# Patient Record
Sex: Female | Born: 1935 | Race: White | Hispanic: No | Smoking: Former smoker
Health system: Southern US, Community
[De-identification: ages and names within clinical notes are randomized; demographics above are authoritative.]

## PROBLEM LIST (undated history)

## (undated) DIAGNOSIS — I509 Heart failure, unspecified: Secondary | ICD-10-CM

## (undated) DIAGNOSIS — L309 Dermatitis, unspecified: Secondary | ICD-10-CM

## (undated) DIAGNOSIS — J45909 Unspecified asthma, uncomplicated: Secondary | ICD-10-CM

## (undated) DIAGNOSIS — K219 Gastro-esophageal reflux disease without esophagitis: Secondary | ICD-10-CM

## (undated) DIAGNOSIS — H269 Unspecified cataract: Secondary | ICD-10-CM

## (undated) DIAGNOSIS — I10 Essential (primary) hypertension: Secondary | ICD-10-CM

## (undated) DIAGNOSIS — M810 Age-related osteoporosis without current pathological fracture: Secondary | ICD-10-CM

## (undated) DIAGNOSIS — E785 Hyperlipidemia, unspecified: Secondary | ICD-10-CM

## (undated) HISTORY — DX: Age-related osteoporosis without current pathological fracture: M81.0

## (undated) HISTORY — DX: Heart failure, unspecified: I50.9

## (undated) HISTORY — DX: Unspecified asthma, uncomplicated: J45.909

## (undated) HISTORY — DX: Dermatitis, unspecified: L30.9

## (undated) HISTORY — DX: Hyperlipidemia, unspecified: E78.5

## (undated) HISTORY — DX: Unspecified cataract: H26.9

## (undated) HISTORY — DX: Essential (primary) hypertension: I10

## (undated) HISTORY — DX: Gastro-esophageal reflux disease without esophagitis: K21.9

---

## 1998-09-22 ENCOUNTER — Other Ambulatory Visit: Admission: RE | Admit: 1998-09-22 | Discharge: 1998-09-22 | Payer: Self-pay | Admitting: Gynecology

## 1999-01-24 ENCOUNTER — Encounter (INDEPENDENT_AMBULATORY_CARE_PROVIDER_SITE_OTHER): Payer: Self-pay | Admitting: Specialist

## 1999-01-24 ENCOUNTER — Other Ambulatory Visit: Admission: RE | Admit: 1999-01-24 | Discharge: 1999-01-24 | Payer: Self-pay | Admitting: *Deleted

## 1999-08-25 ENCOUNTER — Other Ambulatory Visit: Admission: RE | Admit: 1999-08-25 | Discharge: 1999-08-25 | Payer: Self-pay | Admitting: Gynecology

## 2000-10-09 ENCOUNTER — Other Ambulatory Visit: Admission: RE | Admit: 2000-10-09 | Discharge: 2000-10-09 | Payer: Self-pay | Admitting: Gynecology

## 2000-11-05 ENCOUNTER — Encounter: Admission: RE | Admit: 2000-11-05 | Discharge: 2000-11-05 | Payer: Self-pay | Admitting: Surgery

## 2000-11-05 ENCOUNTER — Encounter: Payer: Self-pay | Admitting: Surgery

## 2001-10-07 ENCOUNTER — Other Ambulatory Visit: Admission: RE | Admit: 2001-10-07 | Discharge: 2001-10-07 | Payer: Self-pay | Admitting: Gynecology

## 2001-10-14 ENCOUNTER — Ambulatory Visit (HOSPITAL_COMMUNITY): Admission: RE | Admit: 2001-10-14 | Discharge: 2001-10-14 | Payer: Self-pay | Admitting: Family Medicine

## 2001-10-14 ENCOUNTER — Encounter: Payer: Self-pay | Admitting: Family Medicine

## 2001-11-01 ENCOUNTER — Ambulatory Visit (HOSPITAL_COMMUNITY): Admission: RE | Admit: 2001-11-01 | Discharge: 2001-11-01 | Payer: Self-pay | Admitting: Cardiology

## 2004-06-29 ENCOUNTER — Ambulatory Visit: Payer: Self-pay | Admitting: Internal Medicine

## 2004-07-27 ENCOUNTER — Ambulatory Visit: Payer: Self-pay | Admitting: Internal Medicine

## 2004-08-24 ENCOUNTER — Ambulatory Visit: Payer: Self-pay | Admitting: Internal Medicine

## 2004-09-27 ENCOUNTER — Ambulatory Visit: Payer: Self-pay | Admitting: Internal Medicine

## 2004-10-25 ENCOUNTER — Ambulatory Visit: Payer: Self-pay | Admitting: Internal Medicine

## 2004-11-22 ENCOUNTER — Ambulatory Visit: Payer: Self-pay | Admitting: Internal Medicine

## 2004-12-20 ENCOUNTER — Ambulatory Visit: Payer: Self-pay | Admitting: Internal Medicine

## 2005-01-19 ENCOUNTER — Other Ambulatory Visit: Admission: RE | Admit: 2005-01-19 | Discharge: 2005-01-19 | Payer: Self-pay | Admitting: Gynecology

## 2005-01-19 ENCOUNTER — Ambulatory Visit: Payer: Self-pay | Admitting: Internal Medicine

## 2005-02-20 ENCOUNTER — Ambulatory Visit: Payer: Self-pay | Admitting: Internal Medicine

## 2005-03-21 ENCOUNTER — Ambulatory Visit: Payer: Self-pay | Admitting: Pulmonary Disease

## 2005-04-19 ENCOUNTER — Ambulatory Visit: Payer: Self-pay | Admitting: Pulmonary Disease

## 2005-05-25 ENCOUNTER — Ambulatory Visit: Payer: Self-pay | Admitting: Internal Medicine

## 2005-06-23 ENCOUNTER — Ambulatory Visit: Payer: Self-pay | Admitting: Pulmonary Disease

## 2005-06-30 ENCOUNTER — Ambulatory Visit: Payer: Self-pay | Admitting: Internal Medicine

## 2005-07-21 ENCOUNTER — Ambulatory Visit: Payer: Self-pay | Admitting: Internal Medicine

## 2005-08-21 ENCOUNTER — Ambulatory Visit: Payer: Self-pay | Admitting: Internal Medicine

## 2005-09-22 ENCOUNTER — Ambulatory Visit: Payer: Self-pay | Admitting: Internal Medicine

## 2005-10-19 ENCOUNTER — Ambulatory Visit: Payer: Self-pay | Admitting: Internal Medicine

## 2005-11-22 ENCOUNTER — Ambulatory Visit: Payer: Self-pay | Admitting: Pulmonary Disease

## 2005-12-26 ENCOUNTER — Ambulatory Visit: Payer: Self-pay | Admitting: Internal Medicine

## 2006-01-12 ENCOUNTER — Ambulatory Visit: Payer: Self-pay | Admitting: Internal Medicine

## 2006-01-30 ENCOUNTER — Ambulatory Visit: Payer: Self-pay | Admitting: Internal Medicine

## 2006-03-08 ENCOUNTER — Ambulatory Visit: Payer: Self-pay | Admitting: Internal Medicine

## 2006-04-05 ENCOUNTER — Ambulatory Visit: Payer: Self-pay | Admitting: Internal Medicine

## 2006-05-04 ENCOUNTER — Ambulatory Visit: Payer: Self-pay | Admitting: Internal Medicine

## 2006-05-14 ENCOUNTER — Ambulatory Visit: Payer: Self-pay | Admitting: Internal Medicine

## 2006-06-04 ENCOUNTER — Ambulatory Visit: Payer: Self-pay | Admitting: Internal Medicine

## 2006-07-09 ENCOUNTER — Ambulatory Visit: Payer: Self-pay | Admitting: Internal Medicine

## 2006-08-08 ENCOUNTER — Ambulatory Visit: Payer: Self-pay | Admitting: Internal Medicine

## 2006-09-07 ENCOUNTER — Ambulatory Visit: Payer: Self-pay | Admitting: Pulmonary Disease

## 2006-10-09 ENCOUNTER — Ambulatory Visit: Payer: Self-pay | Admitting: Internal Medicine

## 2006-10-15 ENCOUNTER — Ambulatory Visit: Payer: Self-pay | Admitting: Critical Care Medicine

## 2006-11-08 ENCOUNTER — Ambulatory Visit: Payer: Self-pay | Admitting: Internal Medicine

## 2006-12-12 ENCOUNTER — Ambulatory Visit: Payer: Self-pay | Admitting: Internal Medicine

## 2007-02-20 ENCOUNTER — Ambulatory Visit: Payer: Self-pay | Admitting: Internal Medicine

## 2007-03-21 ENCOUNTER — Ambulatory Visit: Payer: Self-pay | Admitting: Internal Medicine

## 2007-04-26 ENCOUNTER — Ambulatory Visit: Payer: Self-pay | Admitting: Internal Medicine

## 2007-05-28 ENCOUNTER — Ambulatory Visit: Payer: Self-pay | Admitting: Internal Medicine

## 2007-07-01 ENCOUNTER — Ambulatory Visit: Payer: Self-pay | Admitting: Internal Medicine

## 2007-07-25 ENCOUNTER — Encounter: Payer: Self-pay | Admitting: Internal Medicine

## 2007-07-25 DIAGNOSIS — J3089 Other allergic rhinitis: Secondary | ICD-10-CM

## 2007-07-25 DIAGNOSIS — K219 Gastro-esophageal reflux disease without esophagitis: Secondary | ICD-10-CM | POA: Insufficient documentation

## 2007-07-25 DIAGNOSIS — L259 Unspecified contact dermatitis, unspecified cause: Secondary | ICD-10-CM

## 2007-07-25 DIAGNOSIS — J0101 Acute recurrent maxillary sinusitis: Secondary | ICD-10-CM | POA: Insufficient documentation

## 2007-07-25 DIAGNOSIS — M810 Age-related osteoporosis without current pathological fracture: Secondary | ICD-10-CM | POA: Insufficient documentation

## 2007-07-25 DIAGNOSIS — J302 Other seasonal allergic rhinitis: Secondary | ICD-10-CM

## 2007-08-01 ENCOUNTER — Ambulatory Visit: Payer: Self-pay | Admitting: Internal Medicine

## 2007-09-02 ENCOUNTER — Ambulatory Visit: Payer: Self-pay | Admitting: Internal Medicine

## 2007-09-25 ENCOUNTER — Telehealth (INDEPENDENT_AMBULATORY_CARE_PROVIDER_SITE_OTHER): Payer: Self-pay | Admitting: *Deleted

## 2007-10-01 ENCOUNTER — Ambulatory Visit: Payer: Self-pay | Admitting: Internal Medicine

## 2007-10-01 DIAGNOSIS — J4520 Mild intermittent asthma, uncomplicated: Secondary | ICD-10-CM | POA: Insufficient documentation

## 2007-11-04 ENCOUNTER — Ambulatory Visit: Payer: Self-pay | Admitting: Internal Medicine

## 2007-11-20 ENCOUNTER — Encounter: Payer: Self-pay | Admitting: Internal Medicine

## 2007-12-09 ENCOUNTER — Ambulatory Visit: Payer: Self-pay | Admitting: Internal Medicine

## 2008-01-08 ENCOUNTER — Ambulatory Visit: Payer: Self-pay | Admitting: Internal Medicine

## 2008-02-11 ENCOUNTER — Ambulatory Visit: Payer: Self-pay | Admitting: Internal Medicine

## 2008-03-13 ENCOUNTER — Ambulatory Visit: Payer: Self-pay | Admitting: Internal Medicine

## 2008-04-10 ENCOUNTER — Ambulatory Visit: Payer: Self-pay | Admitting: Gastroenterology

## 2008-04-13 ENCOUNTER — Ambulatory Visit: Payer: Self-pay | Admitting: Internal Medicine

## 2008-04-15 ENCOUNTER — Telehealth: Payer: Self-pay | Admitting: Gastroenterology

## 2008-04-21 ENCOUNTER — Encounter: Payer: Self-pay | Admitting: Gastroenterology

## 2008-04-21 ENCOUNTER — Ambulatory Visit: Payer: Self-pay | Admitting: Gastroenterology

## 2008-04-23 ENCOUNTER — Encounter: Payer: Self-pay | Admitting: Gastroenterology

## 2008-05-18 ENCOUNTER — Ambulatory Visit: Payer: Self-pay | Admitting: Internal Medicine

## 2008-06-22 ENCOUNTER — Ambulatory Visit: Payer: Self-pay | Admitting: Internal Medicine

## 2008-07-24 ENCOUNTER — Ambulatory Visit: Payer: Self-pay | Admitting: Internal Medicine

## 2008-08-27 ENCOUNTER — Ambulatory Visit: Payer: Self-pay | Admitting: Internal Medicine

## 2008-09-29 ENCOUNTER — Telehealth (INDEPENDENT_AMBULATORY_CARE_PROVIDER_SITE_OTHER): Payer: Self-pay | Admitting: *Deleted

## 2008-09-29 ENCOUNTER — Ambulatory Visit: Payer: Self-pay | Admitting: Internal Medicine

## 2008-10-28 ENCOUNTER — Ambulatory Visit: Payer: Self-pay | Admitting: Internal Medicine

## 2008-11-30 ENCOUNTER — Ambulatory Visit: Payer: Self-pay | Admitting: Internal Medicine

## 2008-12-29 ENCOUNTER — Ambulatory Visit: Payer: Self-pay | Admitting: Internal Medicine

## 2009-02-01 ENCOUNTER — Ambulatory Visit: Payer: Self-pay | Admitting: Internal Medicine

## 2009-03-05 ENCOUNTER — Ambulatory Visit: Payer: Self-pay | Admitting: Internal Medicine

## 2009-03-12 ENCOUNTER — Ambulatory Visit: Payer: Self-pay | Admitting: Internal Medicine

## 2009-04-06 ENCOUNTER — Ambulatory Visit: Payer: Self-pay | Admitting: Internal Medicine

## 2009-05-06 ENCOUNTER — Ambulatory Visit: Payer: Self-pay | Admitting: Internal Medicine

## 2009-06-07 ENCOUNTER — Ambulatory Visit: Payer: Self-pay | Admitting: Internal Medicine

## 2009-07-05 ENCOUNTER — Telehealth: Payer: Self-pay | Admitting: Internal Medicine

## 2009-07-09 ENCOUNTER — Ambulatory Visit: Payer: Self-pay | Admitting: Internal Medicine

## 2009-08-09 ENCOUNTER — Ambulatory Visit: Payer: Self-pay | Admitting: Internal Medicine

## 2009-09-06 ENCOUNTER — Ambulatory Visit: Payer: Self-pay | Admitting: Internal Medicine

## 2009-10-07 ENCOUNTER — Ambulatory Visit: Payer: Self-pay | Admitting: Internal Medicine

## 2009-11-09 ENCOUNTER — Ambulatory Visit: Payer: Self-pay | Admitting: Internal Medicine

## 2009-12-10 ENCOUNTER — Ambulatory Visit: Payer: Self-pay | Admitting: Internal Medicine

## 2009-12-14 ENCOUNTER — Telehealth (INDEPENDENT_AMBULATORY_CARE_PROVIDER_SITE_OTHER): Payer: Self-pay | Admitting: *Deleted

## 2010-01-10 ENCOUNTER — Ambulatory Visit: Payer: Self-pay | Admitting: Internal Medicine

## 2010-02-10 ENCOUNTER — Ambulatory Visit: Payer: Self-pay | Admitting: Internal Medicine

## 2010-03-14 ENCOUNTER — Ambulatory Visit: Payer: Self-pay | Admitting: Internal Medicine

## 2010-04-06 ENCOUNTER — Telehealth: Payer: Self-pay | Admitting: Internal Medicine

## 2010-04-13 ENCOUNTER — Ambulatory Visit: Payer: Self-pay | Admitting: Internal Medicine

## 2010-05-16 ENCOUNTER — Ambulatory Visit: Payer: Self-pay | Admitting: Internal Medicine

## 2010-06-15 ENCOUNTER — Ambulatory Visit: Payer: Self-pay | Admitting: Internal Medicine

## 2010-06-26 HISTORY — PX: CATARACT EXTRACTION, BILATERAL: SHX1313

## 2010-07-15 ENCOUNTER — Ambulatory Visit
Admission: RE | Admit: 2010-07-15 | Discharge: 2010-07-15 | Payer: Self-pay | Source: Home / Self Care | Attending: Internal Medicine | Admitting: Internal Medicine

## 2010-07-28 NOTE — Miscellaneous (Signed)
Summary: Injection Record / [facility]    Injection Record / [facility]    Imported By: Lennie Odor 11/15/2009 15:04:11  _____________________________________________________________________  External Attachment:    Type:   Image     Comment:   External Document

## 2010-07-28 NOTE — Assessment & Plan Note (Signed)
Summary: xoliar shot/cb  Nurse Visit   Allergies: 1)  ! Pcn 2)  ! Biaxin  Medication Administration  Injection # 1:    Medication: Xolair (omalizumab) 150mg     Diagnosis: 493.00    Route: SQ    Site: R deltoid    Exp Date: 03/2012    Lot #: [id number]    Mfr: Salome Spotted    Comments: 0.9 ML IN RIGHT  AND LEFT ARM CHARGED [phone] AND J 2357    Patient tolerated injection without complications    Given by: TAMMY Lesage IN ALLERGY LAB   Medication Administration  Injection # 1:    Medication: Xolair (omalizumab) 150mg     Diagnosis: 493.00    Route: SQ    Site: R deltoid    Exp Date: 03/2012    Lot #: [id number]    Mfr: Genetech    Comments: 0.9 ML IN RIGHT  AND LEFT ARM CHARGED [id number] AND J 2357    Patient tolerated injection without complications    Given by: TAMMY Tagg IN ALLERGY LAB

## 2010-07-28 NOTE — Assessment & Plan Note (Signed)
Summary: Deborah Branch ///KP  Nurse Visit   Allergies: 1)  ! Pcn 2)  ! Biaxin  Medication Administration  Injection # 1:    Medication: Xolair (omalizumab) 150mg     Diagnosis: EXTRINSIC ASTHMA, UNSPECIFIED (ICD-493.00)    Route: SQ    Site: R deltoid    Exp Date: 10/24/2012    Lot #: [id number]    Mfr: GENENTECH    Comments: 0.9ML IN RIGHT ARM AND 0.9ML IN LEFT ARM PT WAITED    Patient tolerated injection without complications    Given by: SUSANNE FORD IN ALLERGY LAB  Orders Added: 1)  Xolair (omalizumab) 150mg  [J2357] 2)  Administration xolair injection [04540]   Medication Administration  Injection # 1:    Medication: Xolair (omalizumab) 150mg     Diagnosis: EXTRINSIC ASTHMA, UNSPECIFIED (ICD-493.00)    Route: SQ    Site: R deltoid    Exp Date: 10/24/2012    Lot #: [id number]    Mfr: GENENTECH    Comments: 0.9ML IN RIGHT ARM AND 0.9ML IN LEFT ARM PT WAITED    Patient tolerated injection without complications    Given by: SUSANNE FORD IN ALLERGY LAB  Orders Added: 1)  Xolair (omalizumab) 150mg  [J2357] 2)  Administration xolair injection [47829]

## 2010-07-28 NOTE — Assessment & Plan Note (Signed)
Summary: xolair/mhh  Nurse Visit   Allergies: 1)  ! Pcn 2)  ! Biaxin  Medication Administration  Injection # 1:    Medication: Xolair (omalizumab) 150mg     Diagnosis: EXTRINSIC ASTHMA, UNSPECIFIED (ICD-493.00)    Route: SQ    Site: L deltoid    Exp Date: 03/2013    Lot #: [id number]    Mfr: Genetech    Comments: 0.9 ML IN LEFT AND RIGHT ARM 225 MG PT DIDNT WAIT CHARGED J2357 AND 29562    Given by: Dimas Millin IN ALLERGY LAB  Orders Added: 1)  Xolair (omalizumab) 150mg  [J2357] 2)  Administration xolair injection [13086]   Medication Administration  Injection # 1:    Medication: Xolair (omalizumab) 150mg     Diagnosis: EXTRINSIC ASTHMA, UNSPECIFIED (ICD-493.00)    Route: SQ    Site: L deltoid    Exp Date: 03/2013    Lot #: [id number]    Mfr: Genetech    Comments: 0.9 ML IN LEFT AND RIGHT ARM 225 MG PT DIDNT WAIT CHARGED [id number] AND 62952    Given by: Babette Relic Driskill IN ALLERGY LAB  Orders Added: 1)  Xolair (omalizumab) 150mg  [J2357] 2)  Administration xolair injection [84132]

## 2010-07-28 NOTE — Assessment & Plan Note (Signed)
Summary: xolair/apc  Nurse Visit   Allergies: 1)  ! Pcn 2)  ! Biaxin  Medication Administration  Injection # 1:    Medication: Xolair (omalizumab) 150mg     Diagnosis: EXTRINSIC ASTHMA, UNSPECIFIED (ICD-493.00)    Route: IM    Site: R deltoid    Exp Date: 01/24/2013    Lot #: [id number]    Mfr: Genetech    Comments: Xolair 225 mg, 60 units, 0.9 ml x 1 in right deltoid and 0.9 ml x 1 in left deltoid. pt waited 30 mins.    Given by: Tammy Bleicher, allergy tech  Orders Added: 1)  Xolair (omalizumab) 150mg  [J2357] 2)  Administration xolair injection [id number]   Medication Administration  Injection # 1:    Medication: Xolair (omalizumab) 150mg     Diagnosis: EXTRINSIC ASTHMA, UNSPECIFIED (ICD-493.00)    Route: IM    Site: R deltoid    Exp Date: 01/24/2013    Lot #: [id number]    Mfr: Genetech    Comments: Xolair 225 mg, 60 units, 0.9 ml x 1 in right deltoid and 0.9 ml x 1 in left deltoid. pt waited 30 mins.    Given by: Tammy Welp, allergy tech  Orders Added: 1)  Xolair (omalizumab) 150mg  [J2357] 2)  Administration xolair injection [phone]

## 2010-07-28 NOTE — Assessment & Plan Note (Signed)
Summary: Deborah Branch  Nurse Visit   Allergies: 1)  ! Pcn 2)  ! Biaxin  Medication Administration  Injection # 1:    Medication: Xolair (omalizumab) 150mg     Diagnosis: EXTRINSIC ASTHMA, UNSPECIFIED (ICD-493.00)    Route: SQ    Site: L deltoid    Exp Date: 09/2012    Lot #: [id number]    Mfr: Mendel Ryder    Comments: Injection given by Dimas Millin in allergy lab. Xolair 225mg . 0.57ml x 1 in Left and Right Deltoid. Pt waited 30 minutes.     Patient tolerated injection without complications  Orders Added: 1)  Admin of Therapeutic Inj  intramuscular or subcutaneous [96372] 2)  Xolair (omalizumab) 150mg  [J2357]

## 2010-07-28 NOTE — Progress Notes (Signed)
Summary: refill on flonase  Phone Note Call from Patient Call back at [phone]   Caller: Patient Call For: young Summary of Call: need refill for  generic flonage [facility] Initial call taken by: Rickard Patience,  December 14, 2009 2:54 PM  Follow-up for Phone Call        pt was last seen by CY 02/2009 and has pending yearly f/u appt scheduled with CY for 03-11-2010.  Therefore rx sent to pharmacy.   pt aware.  Aundra Millet Reynolds LPN  December 14, 2009 3:05 PM     Prescriptions: FLUTICASONE PROPIONATE 50 MCG/ACT  SUSP (FLUTICASONE PROPIONATE) Use one spray each nostril once daily  #1 x 3   Entered by:   Arman Filter LPN   Authorized by:   Waymon Budge MD   Signed by:   Arman Filter LPN on 11/91/4782   Method used:   Electronically to        [facility] [address] (retail)       [address]       [place], [place]  [address]       Ph: [phone] or [phone]       Fax: [phone]   RxID:   [id number]

## 2010-07-28 NOTE — Assessment & Plan Note (Signed)
Summary: Deborah Branch  Nurse Visit   Allergies: 1)  ! Pcn 2)  ! Biaxin  Medication Administration  Injection # 1:    Medication: Xolair (omalizumab) 150mg     Diagnosis: EXTRINSIC ASTHMA, UNSPECIFIED (ICD-493.00)    Route: SQ    Site: R deltoid    Exp Date: 03/2013    Lot #: [id number]    Mfr: Genetech    Comments: 0.9 ML IN RIGHT AND LEFT ARM 225 MG CHARGE J2357 AND 04540    Given by: Drucie Opitz IN ALLERGY LAB  Orders Added: 1)  Xolair (omalizumab) 150mg  [J2357] 2)  Administration xolair injection [id number]   Medication Administration  Injection # 1:    Medication: Xolair (omalizumab) 150mg     Diagnosis: EXTRINSIC ASTHMA, UNSPECIFIED (ICD-493.00)    Route: SQ    Site: R deltoid    Exp Date: 03/2013    Lot #: [id number]    Mfr: Genetech    Comments: 0.9 ML IN RIGHT AND LEFT ARM 225 MG CHARGE J2357 AND 47829    Given by: Drucie Opitz IN ALLERGY LAB  Orders Added: 1)  Xolair (omalizumab) 150mg  [J2357] 2)  Administration xolair injection [56213]

## 2010-07-28 NOTE — Assessment & Plan Note (Signed)
Summary: Deborah Branch  Nurse Visit   Allergies: 1)  ! Pcn 2)  ! Biaxin  Medication Administration  Injection # 1:    Medication: Xolair (omalizumab) 150mg     Diagnosis: EXTRINSIC ASTHMA, UNSPECIFIED (ICD-493.00)    Route: IM    Site: R deltoid    Exp Date: 03/26/2013    Lot #: [id number]    Mfr: Genetech    Comments: xolair 225 mg, 60 units, 0.9 ml in right and left deltoid. Pt waited 15 mins.    Given by: Drucie Opitz, CMA (AAMA)  Orders Added: 1)  Xolair (omalizumab) 150mg  [J2357] 2)  Administration xolair injection [phone]

## 2010-07-28 NOTE — Assessment & Plan Note (Signed)
Summary: Deborah Branch INJ//SH  Nurse Visit   Allergies: 1)  ! Pcn 2)  ! Biaxin  Medication Administration  Injection # 1:    Medication: Xolair (omalizumab) 150mg     Diagnosis: EXTRINSIC ASTHMA, UNSPECIFIED (ICD-493.00)    Route: SQ    Site: R deltoid    Exp Date: 06/2013    Lot #: [id number]    Mfr: Genetech    Comments: 0.9 ML IN RIGHT AND LEFT 225MG  CHARGED J2357 AND  04540    Given by: Dimas Millin IN ALLERGY LAB  Orders Added: 1)  Xolair (omalizumab) 150mg  [J2357] 2)  Administration xolair injection [98119]   Medication Administration  Injection # 1:    Medication: Xolair (omalizumab) 150mg     Diagnosis: EXTRINSIC ASTHMA, UNSPECIFIED (ICD-493.00)    Route: SQ    Site: R deltoid    Exp Date: 06/2013    Lot #: [id number]    Mfr: Genetech    Comments: 0.9 ML IN RIGHT AND LEFT 225MG  CHARGED [id number] AND  56213    Given by: Dimas Millin IN ALLERGY LAB  Orders Added: 1)  Xolair (omalizumab) 150mg  [J2357] 2)  Administration xolair injection [08657]

## 2010-07-28 NOTE — Progress Notes (Signed)
Summary: rx/ cough  Phone Note Call from Patient   Caller: Patient Call For: young Summary of Call: pt c/o cough. says it hasn't improved since last seen by dr young. pt says he told her to call if she needed something for this. "mostly non-productive". wants rx called in as early as possible as pt's spouse is going to be in hospital tomorrow and pt won't be able to get to pharmacy. [facility] [place]. pt cell [phone] Initial call taken by: Tivis Ringer, CNA,  April 06, 2010 9:35 AM  Follow-up for Phone Call        [facility].   Aundra Millet Reynolds LPN  April 06, 2010 10:16 AM  Pt states that she is having a cough that is sometimes productive with white phlegm, other times it is a dry cough. Pt states she had this cough at last OV in 9-11 and it improved a little with her neb treatments but has since returned. Pt states otherwise she feels fine, just has this nagging cough. Please advise. Carron Curie CMA  April 06, 2010 11:24 AM   Additional Follow-up for Phone Call Additional follow up Details #1::        She said in september that cough had just started with a sore throat.  Offer benzonatate 100 mg, 1-2, three times a day as needed, # 30, ref x 1. OR: hydromet coughsyrup, 200 ml, 1 teaspoon four times a day as needed cough. Remind her that reflux , post nasal drip, and Lisinopril can all cause dry cough.  Additional Follow-up by: Waymon Budge MD,  April 06, 2010 2:48 PM    Additional Follow-up for Phone Call Additional follow up Details #2::    Spoke with pt and advised of the above and she preferred tablet. rx sent. Carron Curie CMA  April 06, 2010 3:10 PM   New/Updated Medications: BENZONATATE 100 MG CAPS (BENZONATATE) 1-2 tablets three times a day as needed Prescriptions: BENZONATATE 100 MG CAPS (BENZONATATE) 1-2 tablets three times a day as needed  #30 x 1   Entered by:   Carron Curie CMA   Authorized by:   Waymon Budge MD   Signed by:    Carron Curie CMA on 04/06/2010   Method used:   Electronically to        [facility] [address] (retail)       [address]       [place], [place]  [address]       Ph: [phone] or [phone]       Fax: [phone]   RxID:   [id number]

## 2010-07-28 NOTE — Assessment & Plan Note (Signed)
Summary: rov 1 yr ///kp   Primary Provider/Referring Provider:  Mathis Bud  CC:  1 year follow up c/o sore throat and non-prod cough x 24 hrs.  History of Present Illness: 76 year old woman returning for one year follow-up of chronic asthma and allergic rhinosinusitis, complicated by a history of eczema and esophageal reflux.  She has been on Xolair injections, 225 mg per month, for several years.  She credits  Xolair for stabilizing her control and reducing the need for prednisone.  03/13/08- doing "great".  Reflux, controlled.  Wants a flu shot.  Using Advair once daily.  No nasal congestion or drainage.  No recent eczema.  No routine cough on lisinopril.  03/12/09 Chronic asthma, allergic rhinosinusitis Continues Xolair injections. Once during Spring she had a mild flare of wheeze after walking on beach in pollen season. She cleared with her own meds and didn't need to seek help. She believes Geoffry Paradise has helped.   March 14, 2010 Chronic asthma, allergic rhinosinusitis  Had done very well over the past year-crediting Xolair, with no major flares. Just in the last 2-3 days has developed sore throat, cough with scant yellow. No fever, no headache, no GI upset. Chest feel a little tight. Continues Advair twice daily. Has never needed to use her rescue inhaler.  Daily prilosec suppresses heartburn.   Asthma History    Initial Asthma Severity Rating:    Age range: 12+ years    Symptoms: 0-2 days/week    Nighttime Awakenings: 0-2/month    Interferes w/ normal activity: no limitations    SABA use (not for EIB): 0-2 days/week    Asthma Severity Assessment: Intermittent   Preventive Screening-Counseling & Management  Alcohol-Tobacco     Smoking Status: never  Current Medications (verified): 1)  Fluticasone Propionate 50 Mcg/act  Susp (Fluticasone Propionate) .... Use One Spray Each Nostril Once Daily 2)  Xolair 150 Mg  Solr (Omalizumab) .... 225 Mg/ Month 3)  Advair Diskus 100-50  Mcg/dose  Misc (Fluticasone-Salmeterol) .Marland Kitchen.. 1 Puff Two Times A Day Rinse Mouth Well 4)  Claritin 10 Mg  Tabs (Loratadine) 5)  Lisinopril 5 Mg  Tabs (Lisinopril) 6)  Bayer Aspirin Ec Low Dose 81 Mg  Tbec (Aspirin) 7)  Prilosec 20 Mg Cpdr (Omeprazole) .Marland Kitchen.. 1once Daily 8)  Simvastatin 20 Mg Tabs (Simvastatin) .Marland Kitchen.. 1 Once Daily 9)  Vitamin D 2000 Unit Tabs (Cholecalciferol) .Marland Kitchen.. 1 Once Daily 10)  Caltrate 600 1500 Mg Tabs (Calcium Carbonate) .Marland Kitchen.. 1 Once Daily 11)  Proair Hfa 108 (90 Base) Mcg/act Aers (Albuterol Sulfate) .... 2 Puffs Four Times A Day As Needed, Rescue  Allergies (verified): 1)  ! Pcn 2)  ! Biaxin  Past History:  Past Medical History: Last updated: 03/13/2008 EXTRINSIC ASTHMA, UNSPECIFIED (ICD-493.00) SINUSITIS (ICD-473.9) ALLERGIC RHINITIS (ICD-477.9) ECZEMA (ICD-692.9) OSTEOPOROSIS (ICD-733.00) ESOPHAGEAL REFLUX (ICD-530.81)  Family History: Last updated: 03/12/2009 Cancer - liver - sister Heart disease - father MI Mother- died CVA  Social History: Last updated: 03/12/2009 Married Patient never smoked.  Retired - Radiation protection practitioner  Risk Factors: Smoking Status: never (03/14/2010)  Past Surgical History: resection of a cyst Cataract- bilaterally Ca Review of Systems      See HPI       The patient complains of non-productive cough, sore throat, and change in color of mucus.  The patient denies shortness of breath with activity, shortness of breath at rest, productive cough, chest pain, irregular heartbeats, acid heartburn, indigestion, loss of appetite, weight change, abdominal pain, difficulty swallowing, tooth/dental problems,  headaches, nasal congestion/difficulty breathing through nose, sneezing, itching, ear ache, joint stiffness or pain, rash, and fever.    Vital Signs:  Patient profile:   76 year old female Height:      60 inches Weight:      146.8 pounds BMI:     28.77 O2 Sat:      97 % on Room air Pulse rate:   83 / minute BP sitting:   160 /  70  (left arm)  Vitals Entered By: Renold Genta RCP, LPN (March 14, 2010 1:42 PM)  O2 Sat at Rest %:  97% O2 Flow:  Room air CC: 1 year follow up c/o sore throat, non-prod cough x 24 hrs Comments Medications reviewed with patient Renold Genta RCP, LPN  March 14, 2010 1:42 PM    Physical Exam  Additional Exam:  General: A/Ox3; pleasant and cooperative, NAD, SKIN: no rash, lesions NODES: no lymphadenopathy HEENT: Zinc/AT, EOM- WNL, Conjuctivae- clear, PERRLA, TM-WNL, Nose- clear, Throat-red/ no exudate or drainage, Mallampati II NECK: Supple w/ fair ROM, JVD- none, normal carotid impulses w/o bruits Thyroid- n CHEST: Mild husky rattle with light coughA, no wheeze or cough , unlabored HEART: RRR, no m/g/r heard ABDOMEN: Soft and nl;  ZOX:WRUE, nl pulses, no edema  NEURO: Grossly intact to observation      Impression & Recommendations:  Problem # 1:  ALLERGIC RHINITIS (ICD-477.9)  Good control with Xolair and her fluticasone. Her updated medication list for this problem includes:    Fluticasone Propionate 50 Mcg/act Susp (Fluticasone propionate) ..... Use one spray each nostril once daily    Claritin 10 Mg Tabs (Loratadine)  Problem # 2:  EXTRINSIC ASTHMA, UNSPECIFIED (ICD-493.00) Great control of asthma. Now has a very mild bronchits syndrome- probably viral but possibly seasonal allergy. Discussed Lisinopril/ ACEI cough which she hasn't had. She was using her Advair just nce daily. I will give a sample of the 250 strength and have her use it twice daily tll used up, then go back to her  100/50.  Other Orders: Est. Patient Level IV (45409) Influenza Vaccine MCR (81191) Administration xolair injection [phone])  Patient Instructions: 1)  Please schedule a follow-up appointment in 1 year. 2)  Flu vax 3)  Sample Advair 250/50-  4)  1 puff and rinse mouth, tiwce every day till used up, then resume your usual Advair. 5)  Fluids, rest, throat lozenges and etc until  feeling better. Please call if you need me.   Influenza Vaccine    Vaccine Type: Fluvax MCR    Site: left deltoid    Mfr: GlaxoSmithKline    Dose: 0.5 ml    Route: IM    Given by: Kandice Hams CMA    Exp. Date: 12/24/2010    Lot #: [id number]    VIS given: 01/17/07 version given March 14, 2010.  Flu Vaccine Consent Questions    Do you have a history of severe allergic reactions to this vaccine? no    Any prior history of allergic reactions to egg and/or gelatin? no    Do you have a sensitivity to the preservative Thimersol? no    Do you have a past history of Guillan-Barre Syndrome? no    Do you currently have an acute febrile illness? no    Have you ever had a severe reaction to latex? no    Vaccine information given and explained to patient? yes    Are you currently pregnant? no   Medication Administration  Injection #  1:    Medication: Xolair (omalizumab) 150mg     Diagnosis: EXTRINSIC ASTHMA, UNSPECIFIED (ICD-493.00)    Route: IM    Site: R deltoid    Exp Date: 03/26/2013    Lot #: [id number]    Mfr: Genetech    Comments: xolair 225 mg, 60 units. 0.9 ml x 1 in right deltoid and 0.9 ml x 1 in left deltoid 2 lot #'s [id number], [id number]. Pt waited for 20 mins.    Given by: Drucie Opitz, CMA (AAMA)  Orders Added: 1)  Est. Patient Level IV [62952] 2)  Influenza Vaccine MCR [00025] 3)  Administration xolair injection [phone]

## 2010-07-28 NOTE — Assessment & Plan Note (Signed)
Summary: xolair/jd  Nurse Visit   Allergies: 1)  ! Pcn 2)  ! Biaxin  Medication Administration  Injection # 1:    Medication: Xolair (omalizumab) 150mg     Diagnosis: EXTRINSIC ASTHMA, UNSPECIFIED (ICD-493.00)    Route: SQ    Site: L deltoid    Exp Date: 06/2012    Lot #: [id number]    Mfr: Mendel Ryder    Comments: Injection given by Dimas Millin in allergy lab. Xolair 225mg . 0.71ml x 1 in Left and Right Deltoid. Pt waited 30 mintues.     Patient tolerated injection without complications  Orders Added: 1)  Admin of Therapeutic Inj  intramuscular or subcutaneous [96372] 2)  Xolair (omalizumab) 150mg  [J2357]

## 2010-07-28 NOTE — Assessment & Plan Note (Signed)
Summary: xolair/ mbw  Nurse Visit   Allergies: 1)  ! Pcn 2)  ! Biaxin  Medication Administration  Injection # 1:    Medication: Xolair (omalizumab) 150mg     Diagnosis: EXTRINSIC ASTHMA, UNSPECIFIED (ICD-493.00)    Route: SQ    Site: R deltoid    Exp Date: 10/24/2012    Lot #: [id number]    Mfr: GENENTECH    Comments: 0.9 ML IN RIGHT AND LEFT ARM PT WAITED 30 MINS    Patient tolerated injection without complications    Given by: TAMMY Sollenberger IN ALLERGY LAB  Orders Added: 1)  Xolair (omalizumab) 150mg  [J2357] 2)  Administration xolair injection [28413]   Medication Administration  Injection # 1:    Medication: Xolair (omalizumab) 150mg     Diagnosis: EXTRINSIC ASTHMA, UNSPECIFIED (ICD-493.00)    Route: SQ    Site: R deltoid    Exp Date: 10/24/2012    Lot #: [id number]    Mfr: GENENTECH    Comments: 0.9 ML IN RIGHT AND LEFT ARM PT WAITED 30 MINS    Patient tolerated injection without complications    Given by: TAMMY Vancleve IN ALLERGY LAB  Orders Added: 1)  Xolair (omalizumab) 150mg  [J2357] 2)  Administration xolair injection [27253]

## 2010-07-28 NOTE — Assessment & Plan Note (Signed)
Summary: xolair/apc  Nurse Visit   Allergies: 1)  ! Pcn 2)  ! Biaxin  Medication Administration  Injection # 1:    Medication: Xolair (omalizumab) 150mg     Diagnosis: EXTRINSIC ASTHMA, UNSPECIFIED (ICD-493.00)    Route: SQ    Site: L deltoid    Exp Date: 10/24/2012    Lot #: [id number]    Mfr: GENENTECH    Comments: 0.9 ML IN LEFT AND RIGHT ARM PT WAITED 20 MINS ALSO CHARGED [phone]    Patient tolerated injection without complications    Given by: Reynaldo Minium IN ALLERGY LAB   Medication Administration  Injection # 1:    Medication: Xolair (omalizumab) 150mg     Diagnosis: EXTRINSIC ASTHMA, UNSPECIFIED (ICD-493.00)    Route: SQ    Site: L deltoid    Exp Date: 10/24/2012    Lot #: [id number]    Mfr: GENENTECH    Comments: 0.9 ML IN LEFT AND RIGHT ARM PT WAITED 20 MINS ALSO CHARGED [phone]    Patient tolerated injection without complications    Given by: Reynaldo Minium IN ALLERGY LAB

## 2010-07-28 NOTE — Assessment & Plan Note (Signed)
Summary: xolair/ mbw  Nurse Visit   Allergies: 1)  ! Pcn 2)  ! Biaxin  Medication Administration  Injection # 1:    Medication: Xolair (omalizumab) 150mg     Diagnosis: EXTRINSIC ASTHMA, UNSPECIFIED (ICD-493.00)    Route: SQ    Site: R deltoid    Exp Date: 08/2012    Lot #: [id number]    Mfr: Mendel Ryder    Comments: Injection given by Dimas Millin in allergy lab. Xolair 225mg . 0.81ml x 1 in Right and Left Deltoid. Pt waited 30 minutes.    Patient tolerated injection without complications  Orders Added: 1)  Admin of Therapeutic Inj  intramuscular or subcutaneous [96372] 2)  Xolair (omalizumab) 150mg  [J2357]

## 2010-07-28 NOTE — Progress Notes (Signed)
Summary: xolair   Phone Note Call from Patient Call back at [facility] Phone [phone]   Caller: Patient Call For: Tiann Saha Summary of Call: pt is taking avelox for chest congestion/ cough. no fever, but wants to know if it's ok for her to get her xolair shot. she has a few days remaining on abx.  Initial call taken by: Tivis Ringer,  July 05, 2009 9:46 AM  Follow-up for Phone Call        Spoke with SN; ok for pt to take Xolair as long as no fever. Pt aware of recs.Reynaldo Minium CMA  July 05, 2009 10:15 AM

## 2010-08-15 ENCOUNTER — Ambulatory Visit (INDEPENDENT_AMBULATORY_CARE_PROVIDER_SITE_OTHER): Payer: MEDICARE

## 2010-08-15 ENCOUNTER — Encounter: Payer: Self-pay | Admitting: Internal Medicine

## 2010-08-15 DIAGNOSIS — J45909 Unspecified asthma, uncomplicated: Secondary | ICD-10-CM

## 2010-08-17 NOTE — Miscellaneous (Signed)
Summary: Injection Financial risk analyst   Imported By: Sherian Rein 08/11/2010 08:42:23  _____________________________________________________________________  External Attachment:    Type:   Image     Comment:   External Document

## 2010-08-23 NOTE — Assessment & Plan Note (Signed)
Summary: Deborah Branch ///kp  Nurse Visit   Allergies: 1)  ! Pcn 2)  ! Biaxin  Medication Administration  Injection # 1:    Medication: Xolair (omalizumab) 150mg     Diagnosis: EXTRINSIC ASTHMA, UNSPECIFIED (ICD-493.00)    Route: SQ    Site: R deltoid    Exp Date: 06/2013    Lot #: [id number]    Mfr: Salome Spotted    Comments: 0.9 ML IN RIGHT AND LEFT ARM 225 MG CHARGED J2357 AND  95621     Given by: Dimas Millin IN ALLERGY LAB  Orders Added: 1)  Xolair (omalizumab) 150mg  [J2357] 2)  Administration xolair injection [30865]   Medication Administration  Injection # 1:    Medication: Xolair (omalizumab) 150mg     Diagnosis: EXTRINSIC ASTHMA, UNSPECIFIED (ICD-493.00)    Route: SQ    Site: R deltoid    Exp Date: 06/2013    Lot #: [id number]    Mfr: Salome Spotted    Comments: 0.9 ML IN RIGHT AND LEFT ARM 225 MG CHARGED [id number] AND  29528     Given by: Babette Relic Kenan IN ALLERGY LAB  Orders Added: 1)  Xolair (omalizumab) 150mg  [J2357] 2)  Administration xolair injection [41324]

## 2010-09-14 ENCOUNTER — Ambulatory Visit (INDEPENDENT_AMBULATORY_CARE_PROVIDER_SITE_OTHER): Payer: MEDICARE

## 2010-09-14 DIAGNOSIS — J45909 Unspecified asthma, uncomplicated: Secondary | ICD-10-CM

## 2010-09-14 MED ORDER — OMALIZUMAB 150 MG ~~LOC~~ SOLR
225.0000 mg | Freq: Once | SUBCUTANEOUS | Status: AC
  Administered 2010-09-14: 225 mg via SUBCUTANEOUS

## 2010-09-15 ENCOUNTER — Telehealth: Payer: Self-pay | Admitting: Internal Medicine

## 2010-09-15 MED ORDER — DOXYCYCLINE HYCLATE 100 MG PO TABS: ORAL_TABLET | ORAL | Status: DC

## 2010-09-15 NOTE — Telephone Encounter (Signed)
See note below

## 2010-09-15 NOTE — Telephone Encounter (Signed)
Please offer doxycycline 100 mg, # 8,  2 today then one daily She can take otc meds like tylenol sinus and allergy   If helpful.

## 2010-09-15 NOTE — Telephone Encounter (Signed)
Called and advised pt of cdy recs. Pt verbalized understanding and that rx was sent Carver Fila, MA

## 2010-09-15 NOTE — Telephone Encounter (Signed)
Spoke with pt and she is c/o having a sore throat, PND, and blowing yellow mucus from nose and dry cough x 2 days. Pt denies fever, chest tightness, wheezing or SOB. Please advise.  Allergies  Allergen Reactions  . Clarithromycin     REACTION: itching  . Penicillins     REACTION: hives

## 2010-10-14 ENCOUNTER — Ambulatory Visit (INDEPENDENT_AMBULATORY_CARE_PROVIDER_SITE_OTHER): Payer: MEDICARE

## 2010-10-14 DIAGNOSIS — J45909 Unspecified asthma, uncomplicated: Secondary | ICD-10-CM

## 2010-10-17 MED ORDER — OMALIZUMAB 150 MG ~~LOC~~ SOLR
225.0000 mg | Freq: Once | SUBCUTANEOUS | Status: AC
  Administered 2010-10-17: 225 mg via SUBCUTANEOUS

## 2010-11-08 NOTE — Assessment & Plan Note (Signed)
[facility]                             PULMONARY OFFICE NOTE   NAME:SCOTTCrystallynn, Noorani                         MRN:          [id number]  DATE:03/21/2007                            DOB:          1934/11/29    PROBLEM:  1. Chronic asthma.  2. Allergic rhinitis.  3. Recurrent sinusitis.  4. Eczema.  5. Esophageal reflux.  6. Osteoporosis.   HISTORY:  She needed to see the nurse practitioner twice over the past  year since I had seen her, but overall feel she is doing very well.  She  is pleased with Xolair which continues at 225 mg per month.  She had  been told to stop taking Fish Oil while there was concern that with her  cough she might have a mineral oil aspiration.  I have given her  permission to restart that.   MEDICATION:  1. Xolair 225 mg per month.  2. Advair 100/50.  3. Flonase.  4. Calcium.  5. Vitamin D.  6. Claritin.  7. Lisinopril 5 mg.  8. Aspirin 81 mg.  9. Actonel.  10.Red yeast rice.  11.Fish Oil.  12.PRN use of Sudafed.   DRUG INTOLERANT:  1. PENICILLIN.  2. BIAXIN.   OBJECTIVE:  Weight 155 pounds, BP 158/72, pulse 75, room air saturation  95%.  She looks quite well and cheerful.  CHEST:  Clear.  NASAL AIRWAY:  Unobstructed.  HEART:  Sounds normal.   IMPRESSION:  Great control of asthma and rhinitis.   PLAN:  1. We refilled her fluticasone nasal spray.  2. She will continue Xolair.  3. Note that Dr. Regino Schultze gave her Flu vaccine.  4. Schedule return in 1 year, earlier p.r.n.     Clinton D. Maple Hudson, MD, Tonny Bollman, FACP  Electronically Signed    CDY/MedQ  DD: 03/24/2007  DT: 03/24/2007  Job #: [id number]   cc:   Kirk Ruths, M.D.

## 2010-11-11 NOTE — Assessment & Plan Note (Signed)
[facility]                               PULMONARY OFFICE NOTE   NAME:SCOTTYariana, Hoaglund                         MRN:          [id number]  DATE:01/12/2006                            DOB:          [birth year]    PROBLEMS:  1.  Chronic asthma.  2.  Allergic rhinitis.  3.  Recurrent sinusitis.  4.  Eczema.  5.  Esophageal reflux.  6.  Osteoporosis.   HISTORY:  She feels she is doing very well with no recent problems.  She  continues to use her Advair 100/50 on a maintenance dosing at once daily  understanding that she can increase to b.i.d. if there are problems.  She  continues Xolair injections 225 mg one time a month.  She does have an  EpiPen.  She has now been on Xolair since February 2004 and it seems to have  made a marked improvement in her need for steroid therapy for exacerbations  of asthma.  We reviewed her medication list as charted.  SHE IS INTOLERANT  TO PENICILLIN AND BIAXIN.   OBJECTIVE:  Weight 151 pounds, BP 136/72, pulse regular 67, room air  saturation 98%.  She looks bright, comfortable and alert.  Eyes, nose and  throat are clear.  Lungs are clear to P&A with no cough or wheeze.  Heart  sounds are regular without murmur.   IMPRESSION:  Stable asthma and allergic rhinitis under good control.   PLAN:  1.  Risk and epinephrine discussions.  2.  Xolair anaphylaxis discussion.  She has an EpiPen and knows how to use      it.  3.  We refilled albuterol rescue inhaler.  4.  Schedule return 1 year, earlier p.r.n.                                   Clinton D. Maple Hudson, MD, FCCP, FACP   CDY/MedQ  DD:  01/13/2006  DT:  01/13/2006  Job #:  [id number]   cc:   Kirk Ruths, MD

## 2010-11-11 NOTE — Assessment & Plan Note (Signed)
[facility]                             PULMONARY OFFICE NOTE   NAME:Deborah Branch, Deborah Branch                         MRN:          [id number]  DATE:10/15/2006                            DOB:          02-19-1935    HISTORY OF PRESENT ILLNESS:  Patient is a 76 year old white female  patient of Dr. Maple Hudson with a known history of chronic asthma and allergic  rhinitis, who presents today for a three-day history of nasal  congestion, sore throat, postnasal drip and dry cough.  Patient denies  any purulent sputum, fever, chest pain, orthopnea, PND or leg-swelling.   PAST MEDICAL HISTORY:  Reviewed.   CURRENT MEDICATIONS:  Reviewed.   PHYSICAL EXAM:  Patient is a pleasant female in no acute distress.  She  is afebrile with stable vitals.  Her O2 saturation is 95% on room air.  HEENT:  Unremarkable.  NECK:  Supple without cervical adenopathy.  No JVD.  LUNG SOUNDS:  Reveal coarse breath sounds bilaterally, without any  wheezing or crackles.  CARDIAC:  Regular rate and rhythm.  ABDOMEN:  Soft and nontender.  EXTREMITIES:  Warm without any edema.   IMPRESSION AND PLAN:  Acute upper respiratory infection with a mild  asthmatic flare.  Patient is to begin Mucinex DM twice daily.  The  patient was given Xopenex nebulizer treatment to have on hold.  Patient  is reporting that she is going out of town.  I have recommended that she  have a Z-Pak to have on hold and a prednisone pack to have on hold.  We  advised, if symptoms do not worsen, she is not to use these medications.  Patient is advised, if symptoms worsen with purulent sputum or wheezing,  she may go ahead and fill these prescriptions to use.  Patient also is  recommended to hold fish oil until her cough resolves.      Rubye Oaks, NP  Electronically Signed      Clinton D. Maple Hudson, MD, Tonny Bollman, FACP  Electronically Signed   TP/MedQ  DD: 10/17/2006  DT: 10/17/2006  Job #: [id number]

## 2010-11-11 NOTE — Assessment & Plan Note (Signed)
[facility]                               PULMONARY OFFICE NOTE   NAME:Deborah Branch, Deborah Branch                         MRN:          [id number]  DATE:05/14/2006                            DOB:          1934-09-11    HISTORY OF PRESENT ILLNESS:  This is a 76 year old white female patient of  Dr. Roxy Cedar who has a known history of chronic asthma and allergic rhinitis  presents for a 1-week history of nasal congestion, sinus pain and pressure,  productive cough and wheezing.  The patient denies any hemoptysis,  orthopnea, PND, recent travel or antibiotic use.  She reports she has been  doing especially well up until this last week.   PAST MEDICAL HISTORY:  Reviewed.   CURRENT MEDICATIONS:  Reviewed.   PHYSICAL EXAMINATION:  GENERAL:  The patient is a pleasant female in no  acute distress.  VITAL SIGNS:  She is afebrile with stable vital signs.  Her O2 saturation is  96% on room air.  HEENT:  nasal mucosa with a mild erythema.  Maxillary sinus tenderness to  percussion.  The patient's oropharynx is clear.  NECK:  Supple without adenopathy.  LUNGS:  Lung sounds reveal coarse breath sounds bilaterally without any  wheezing.  CARDIAC:  Regular rate and rhythm.  ABDOMEN:  Soft.  LOWER EXTREMITIES:  Warm without any edema.   IMPRESSION AND PLAN:  Acute exacerbation of asthmatic bronchitis and  possible mild early sinusitis.  The patient was given Levaquin 750 mg x5  days.  Mucinex DM twice a day.  Prednisone taper in the next week.  Endal HD  8 ounces one half to one teaspoon every 6 hours as needed for cough.  The  patient aware of sedating effect.  The patient was given Xopenex nebulizer  treatment in the office.  The patient is to return back with Dr. Maple Hudson as  scheduled or sooner as needed.      Rubye Oaks, NP  Electronically Signed      Clinton D. Maple Hudson, MD, Tonny Bollman, FACP  Electronically Signed   TP/MedQ  DD: 05/14/2006  DT: 05/14/2006  Job #:  [id number]

## 2010-11-14 ENCOUNTER — Ambulatory Visit (INDEPENDENT_AMBULATORY_CARE_PROVIDER_SITE_OTHER): Payer: Medicare Other

## 2010-11-14 DIAGNOSIS — J45909 Unspecified asthma, uncomplicated: Secondary | ICD-10-CM

## 2010-11-15 MED ORDER — OMALIZUMAB 150 MG ~~LOC~~ SOLR
225.0000 mg | Freq: Once | SUBCUTANEOUS | Status: AC
  Administered 2010-11-15: 225 mg via SUBCUTANEOUS

## 2010-12-14 ENCOUNTER — Ambulatory Visit (INDEPENDENT_AMBULATORY_CARE_PROVIDER_SITE_OTHER): Payer: Medicare Other

## 2010-12-14 DIAGNOSIS — J45909 Unspecified asthma, uncomplicated: Secondary | ICD-10-CM

## 2010-12-14 MED ORDER — OMALIZUMAB 150 MG ~~LOC~~ SOLR
225.0000 mg | Freq: Once | SUBCUTANEOUS | Status: AC
  Administered 2010-12-14: 225 mg via SUBCUTANEOUS

## 2011-01-13 ENCOUNTER — Ambulatory Visit (INDEPENDENT_AMBULATORY_CARE_PROVIDER_SITE_OTHER): Payer: Medicare Other

## 2011-01-13 DIAGNOSIS — J45909 Unspecified asthma, uncomplicated: Secondary | ICD-10-CM

## 2011-01-13 MED ORDER — OMALIZUMAB 150 MG ~~LOC~~ SOLR
225.0000 mg | Freq: Once | SUBCUTANEOUS | Status: AC
  Administered 2011-01-13: 225 mg via SUBCUTANEOUS

## 2011-02-11 ENCOUNTER — Other Ambulatory Visit: Payer: Self-pay | Admitting: Internal Medicine

## 2011-02-13 ENCOUNTER — Ambulatory Visit (INDEPENDENT_AMBULATORY_CARE_PROVIDER_SITE_OTHER): Payer: Medicare Other

## 2011-02-13 DIAGNOSIS — J45909 Unspecified asthma, uncomplicated: Secondary | ICD-10-CM

## 2011-02-13 MED ORDER — OMALIZUMAB 150 MG ~~LOC~~ SOLR
225.0000 mg | Freq: Once | SUBCUTANEOUS | Status: AC
  Administered 2011-02-13: 225 mg via SUBCUTANEOUS

## 2011-02-16 ENCOUNTER — Other Ambulatory Visit: Payer: Self-pay | Admitting: Internal Medicine

## 2011-02-16 MED ORDER — FLUTICASONE PROPIONATE 50 MCG/ACT NA SUSP: 1.0000 | Freq: Every day | NASAL | Status: DC

## 2011-03-13 ENCOUNTER — Encounter: Payer: Self-pay | Admitting: Internal Medicine

## 2011-03-13 ENCOUNTER — Ambulatory Visit (INDEPENDENT_AMBULATORY_CARE_PROVIDER_SITE_OTHER): Payer: Medicare Other | Admitting: Internal Medicine

## 2011-03-13 VITALS — BP 158/56 | HR 71 | Ht 60.0 in | Wt 151.8 lb

## 2011-03-13 DIAGNOSIS — J309 Allergic rhinitis, unspecified: Secondary | ICD-10-CM

## 2011-03-13 DIAGNOSIS — Z23 Encounter for immunization: Secondary | ICD-10-CM

## 2011-03-13 DIAGNOSIS — J45909 Unspecified asthma, uncomplicated: Secondary | ICD-10-CM

## 2011-03-13 MED ORDER — ALBUTEROL SULFATE HFA 108 (90 BASE) MCG/ACT IN AERS: 2.0000 | INHALATION_SPRAY | RESPIRATORY_TRACT | Status: DC | PRN

## 2011-03-13 MED ORDER — FLUTICASONE PROPIONATE 50 MCG/ACT NA SUSP: 2.0000 | Freq: Every day | NASAL | Status: DC

## 2011-03-13 MED ORDER — FLUTICASONE-SALMETEROL 100-50 MCG/DOSE IN AEPB: 1.0000 | INHALATION_SPRAY | Freq: Two times a day (BID) | RESPIRATORY_TRACT | Status: DC

## 2011-03-13 NOTE — Progress Notes (Signed)
  Subjective:    Patient ID: Deborah Branch, female    DOB: 1934-12-10, 76 y.o.   MRN: [id number]  HPI 03/14/11- 76 year old female never smoker followed for chronic asthma, allergic rhinosinusitis complicated by GERD Last here March 14, 2010 Good year- still doing very well on Xolair shots monthly.  Continues Advair once daily but almost never needs to use her rescue inhaler. Continues flonase about every other day- I gave permission to try off it.  No recognized asthma in a very long time.   Review of Systems Constitutional:   No-   weight loss, night sweats, fevers, chills, fatigue, lassitude. HEENT:   No-  headaches, difficulty swallowing, tooth/dental problems, sore throat,       No-  sneezing, itching, ear ache, nasal congestion, post nasal drip,  CV:  No-   chest pain, orthopnea, PND, swelling in lower extremities, anasarca, dizziness, palpitations Resp: No-   shortness of breath with exertion or at rest.              No-   productive cough,  No non-productive cough,  No-  coughing up of blood.              No-   change in color of mucus.  No- wheezing.   Skin: No-   rash or lesions. GI:  No-   heartburn, indigestion, abdominal pain, nausea, vomiting, diarrhea,                 change in bowel habits, loss of appetite GU: No-   dysuria, change in color of urine, no urgency or frequency.  No- flank pain. MS:  No-   joint pain or swelling.  No- decreased range of motion.  No- back pain. Neuro- grossly normal  Psych:  No- change in mood or affect. No depression or anxiety.  No memory loss.     Objective:   Physical Exam General- Alert, Oriented, Affect-appropriate, Distress- none acute  Medium build Skin- rash-none, lesions- none, excoriation- none Lymphadenopathy- none Head- atraumatic            Eyes- Gross vision intact, PERRLA, conjunctivae clear secretions            Ears- Hearing, canals normal            Nose- Clear, no-Septal dev, mucus, polyps, erosion, perforation            Throat- Mallampati III , mucosa clear , drainage- none, tonsils- atrophic Neck- flexible , trachea midline, no stridor , thyroid nl, carotid no bruit Chest - symmetrical excursion , unlabored           Heart/CV- RRR , no murmur , no gallop  , no rub, nl s1 s2                           - JVD- none , edema- none, stasis changes- none, varices- none           Lung- clear to P&A, wheeze- none, cough- none , dullness-none, rub- none           Chest wall-  Abd- tender-no, distended-no, bowel sounds-present, HSM- no Br/ Gen/ Rectal- Not done, not indicated Extrem- cyanosis- none, clubbing, none, atrophy- none, strength- nl Neuro- grossly intact to observation        Assessment & Plan:

## 2011-03-13 NOTE — Patient Instructions (Signed)
Continue Xolair  Ok now to see how you do off Advair, flonase and your rescue inhaler. You can resume them as needed. Scripts written.   Flu vax

## 2011-03-13 NOTE — Assessment & Plan Note (Signed)
We will continue Xolair. Let her decide about trying off Advair.

## 2011-03-18 NOTE — Assessment & Plan Note (Signed)
As noted in overview the Xolair she is getting for her asthma may also help her with allergic rhinitis. She can add a supplemental antihistamine if needed.

## 2011-03-20 ENCOUNTER — Ambulatory Visit (INDEPENDENT_AMBULATORY_CARE_PROVIDER_SITE_OTHER): Payer: Medicare Other

## 2011-03-20 DIAGNOSIS — J45909 Unspecified asthma, uncomplicated: Secondary | ICD-10-CM

## 2011-03-22 DIAGNOSIS — J45909 Unspecified asthma, uncomplicated: Secondary | ICD-10-CM

## 2011-03-22 MED ORDER — OMALIZUMAB 150 MG ~~LOC~~ SOLR
225.0000 mg | Freq: Once | SUBCUTANEOUS | Status: AC
  Administered 2011-03-22: 225 mg via SUBCUTANEOUS

## 2011-04-20 ENCOUNTER — Ambulatory Visit (INDEPENDENT_AMBULATORY_CARE_PROVIDER_SITE_OTHER): Payer: Medicare Other

## 2011-04-20 DIAGNOSIS — J45909 Unspecified asthma, uncomplicated: Secondary | ICD-10-CM

## 2011-04-20 MED ORDER — OMALIZUMAB 150 MG ~~LOC~~ SOLR
225.0000 mg | Freq: Once | SUBCUTANEOUS | Status: AC
  Administered 2011-04-20: 225 mg via SUBCUTANEOUS

## 2011-05-23 ENCOUNTER — Ambulatory Visit (INDEPENDENT_AMBULATORY_CARE_PROVIDER_SITE_OTHER): Payer: Medicare Other

## 2011-05-23 DIAGNOSIS — J45909 Unspecified asthma, uncomplicated: Secondary | ICD-10-CM

## 2011-05-23 MED ORDER — OMALIZUMAB 150 MG ~~LOC~~ SOLR
225.0000 mg | Freq: Once | SUBCUTANEOUS | Status: AC
  Administered 2011-05-23: 225 mg via SUBCUTANEOUS

## 2011-06-12 ENCOUNTER — Ambulatory Visit: Payer: Medicare Other

## 2011-06-23 ENCOUNTER — Telehealth: Payer: Self-pay | Admitting: Internal Medicine

## 2011-06-23 ENCOUNTER — Encounter: Payer: Self-pay | Admitting: Internal Medicine

## 2011-06-23 ENCOUNTER — Ambulatory Visit (INDEPENDENT_AMBULATORY_CARE_PROVIDER_SITE_OTHER): Payer: Medicare Other

## 2011-06-23 ENCOUNTER — Ambulatory Visit (INDEPENDENT_AMBULATORY_CARE_PROVIDER_SITE_OTHER): Payer: Medicare Other | Admitting: Internal Medicine

## 2011-06-23 VITALS — BP 124/80 | HR 83 | Ht 60.0 in | Wt 143.8 lb

## 2011-06-23 DIAGNOSIS — J45909 Unspecified asthma, uncomplicated: Secondary | ICD-10-CM

## 2011-06-23 MED ORDER — DOXYCYCLINE HYCLATE 100 MG PO TABS: ORAL_TABLET | ORAL | Status: DC

## 2011-06-23 MED ORDER — LEVALBUTEROL HCL 0.63 MG/3ML IN NEBU
0.6300 mg | INHALATION_SOLUTION | Freq: Once | RESPIRATORY_TRACT | Status: AC
  Administered 2011-06-23: 0.63 mg via RESPIRATORY_TRACT

## 2011-06-23 MED ORDER — OMALIZUMAB 150 MG ~~LOC~~ SOLR
225.0000 mg | Freq: Once | SUBCUTANEOUS | Status: AC
  Administered 2011-06-23: 225 mg via SUBCUTANEOUS

## 2011-06-23 MED ORDER — METHYLPREDNISOLONE ACETATE 80 MG/ML IJ SUSP: 80.0000 mg | Freq: Once | INTRAMUSCULAR | Status: DC

## 2011-06-23 NOTE — Progress Notes (Signed)
Patient ID: Deborah Branch, female    DOB: November 24, 1934, 76 y.o.   MRN: [id number]  HPI 03/14/11- 76 year old female never smoker followed for chronic asthma, allergic rhinosinusitis complicated by GERD Last here March 14, 2010 Good year- still doing very well on Xolair shots monthly.  Continues Advair once daily but almost never needs to use her rescue inhaler. Continues flonase about every other day- I gave permission to try off it.  No recognized asthma in a very long time.   06/23/11-  76 year old female never smoker followed for chronic asthma, allergic rhinosinusitis complicated by GERD She has continued Xolair and it still works well for her. Has not needed Advair but still has prescription if she needs to restart. In the last 2 days she has started cough, dry or productive of yellow nonbloody sputum, some chills without fever, mild sore throat, no myalgias, no GI upset.  Review of Systems Constitutional:   No-   weight loss, night sweats, fevers, chills, fatigue, lassitude. HEENT:   No-  headaches, difficulty swallowing, tooth/dental problems, +sore throat,       No-  sneezing, itching, ear ache, nasal congestion, post nasal drip,  CV:  No-   chest pain, orthopnea, PND, swelling in lower extremities, anasarca, dizziness, palpitations Resp: No-   shortness of breath with exertion or at rest.              +  productive cough,  No non-productive cough,  No-  coughing up of blood.              No-   change in color of mucus.  No- wheezing.   Skin: No-   rash or lesions. GI:  No-   heartburn, indigestion, abdominal pain, nausea, vomiting, diarrhea,                 change in bowel habits, loss of appetite GU: MS:  No-   joint pain or swelling.  No- decreased range of motion.  No- back pain. Neuro- grossly normal  Psych:  No- change in mood or affect. No depression or anxiety.  No memory loss.     Objective:   Physical Exam General- Alert, Oriented, Affect-appropriate, Distress-  none acute  Medium build Skin- rash-none, lesions- none, excoriation- none Lymphadenopathy- none Head- atraumatic            Eyes- Gross vision intact, PERRLA, conjunctivae clear secretions            Ears- Hearing, canals normal            Nose- Clear, no-Septal dev, mucus, polyps, erosion, perforation             Throat- Mallampati III , mucosa clear , drainage- none, tonsils- atrophic Neck- flexible , trachea midline, no stridor , thyroid nl, carotid no bruit Chest - symmetrical excursion , unlabored           Heart/CV- RRR , no murmur , no gallop  , no rub, nl s1 s2                           - JVD- none , edema- none, stasis changes- none, varices- none           Lung- clear to P&A, wheeze- none, +dry cough , dullness-none, rub- none           Chest wall-  Abd- tender-no, distended-no, bowel sounds-present, HSM- no Br/ Gen/ Rectal- Not  done, not indicated Extrem- cyanosis- none, clubbing, none, atrophy- none, strength- nl Neuro- grossly intact to observation

## 2011-06-23 NOTE — Telephone Encounter (Signed)
Pt will be seen today at 230 pm by CY.

## 2011-06-23 NOTE — Telephone Encounter (Signed)
I spoke with pt and she is requesting to be woked in today when she comes in for her xolair injection at 2:00. Pt c/o cough w/ yellow phlem, wheezing, chest is very tight, chest congestion x 2 days. Pt is afraid she will get worse and the holiday is coming up. Please advise Dr. Maple Hudson, thanks  Allergies  Allergen Reactions  . Clarithromycin     REACTION: itching  . Penicillins     REACTION: hives

## 2011-06-23 NOTE — Patient Instructions (Signed)
Neb xop 0.63  Depo 80  Script to hold for doxycycline antibiotic  Push fluids to keep mucus thin. It may help to take plain mucinex.

## 2011-06-25 NOTE — Assessment & Plan Note (Signed)
Good basic control. Now has an acute viral pattern bronchitis typical of what is going to the community. Plan-supportive care, fluids, Mucinex. Nebulizer treatment here, Depo-Medrol. Prescription for doxycycline to hold.

## 2011-07-20 ENCOUNTER — Ambulatory Visit (INDEPENDENT_AMBULATORY_CARE_PROVIDER_SITE_OTHER): Payer: Medicare Other

## 2011-07-20 DIAGNOSIS — J45909 Unspecified asthma, uncomplicated: Secondary | ICD-10-CM

## 2011-07-24 DIAGNOSIS — J45909 Unspecified asthma, uncomplicated: Secondary | ICD-10-CM

## 2011-07-24 MED ORDER — OMALIZUMAB 150 MG ~~LOC~~ SOLR
225.0000 mg | Freq: Once | SUBCUTANEOUS | Status: AC
  Administered 2011-07-24: 225 mg via SUBCUTANEOUS

## 2011-08-21 ENCOUNTER — Ambulatory Visit (INDEPENDENT_AMBULATORY_CARE_PROVIDER_SITE_OTHER): Payer: Medicare Other

## 2011-08-21 DIAGNOSIS — J45909 Unspecified asthma, uncomplicated: Secondary | ICD-10-CM

## 2011-08-21 MED ORDER — OMALIZUMAB 150 MG ~~LOC~~ SOLR
225.0000 mg | Freq: Once | SUBCUTANEOUS | Status: AC
  Administered 2011-08-21: 225 mg via SUBCUTANEOUS

## 2011-09-25 ENCOUNTER — Ambulatory Visit (INDEPENDENT_AMBULATORY_CARE_PROVIDER_SITE_OTHER): Payer: Medicare Other

## 2011-09-25 DIAGNOSIS — J45909 Unspecified asthma, uncomplicated: Secondary | ICD-10-CM

## 2011-09-27 DIAGNOSIS — J45909 Unspecified asthma, uncomplicated: Secondary | ICD-10-CM

## 2011-09-27 MED ORDER — OMALIZUMAB 150 MG ~~LOC~~ SOLR
225.0000 mg | Freq: Once | SUBCUTANEOUS | Status: AC
  Administered 2011-09-27: 225 mg via SUBCUTANEOUS

## 2011-10-26 ENCOUNTER — Ambulatory Visit (INDEPENDENT_AMBULATORY_CARE_PROVIDER_SITE_OTHER): Payer: Medicare Other

## 2011-10-26 DIAGNOSIS — J45909 Unspecified asthma, uncomplicated: Secondary | ICD-10-CM

## 2011-10-26 MED ORDER — OMALIZUMAB 150 MG ~~LOC~~ SOLR
225.0000 mg | Freq: Once | SUBCUTANEOUS | Status: AC
  Administered 2011-10-26: 225 mg via SUBCUTANEOUS

## 2011-11-27 ENCOUNTER — Ambulatory Visit (INDEPENDENT_AMBULATORY_CARE_PROVIDER_SITE_OTHER): Payer: Medicare Other

## 2011-11-27 DIAGNOSIS — J45909 Unspecified asthma, uncomplicated: Secondary | ICD-10-CM

## 2011-11-27 MED ORDER — OMALIZUMAB 150 MG ~~LOC~~ SOLR
225.0000 mg | Freq: Once | SUBCUTANEOUS | Status: AC
  Administered 2011-11-27: 225 mg via SUBCUTANEOUS

## 2011-12-27 ENCOUNTER — Ambulatory Visit (INDEPENDENT_AMBULATORY_CARE_PROVIDER_SITE_OTHER): Payer: Medicare Other

## 2011-12-27 DIAGNOSIS — J45909 Unspecified asthma, uncomplicated: Secondary | ICD-10-CM

## 2011-12-29 MED ORDER — OMALIZUMAB 150 MG ~~LOC~~ SOLR
225.0000 mg | Freq: Once | SUBCUTANEOUS | Status: AC
  Administered 2011-12-29: 225 mg via SUBCUTANEOUS

## 2011-12-29 NOTE — Addendum Note (Signed)
Addended by: Lacinda Axon C on: 12/29/2011 11:39 AM   Modules accepted: Orders

## 2011-12-29 NOTE — Progress Notes (Signed)
FIRST XOLAIR CHARGED WAS WRONG INFO CORRECTED WRONG CHARGES DELETED/CB

## 2012-01-08 ENCOUNTER — Other Ambulatory Visit (HOSPITAL_COMMUNITY): Payer: Self-pay | Admitting: Family Medicine

## 2012-01-08 DIAGNOSIS — M810 Age-related osteoporosis without current pathological fracture: Secondary | ICD-10-CM

## 2012-01-12 ENCOUNTER — Other Ambulatory Visit (HOSPITAL_COMMUNITY): Payer: Medicare Other

## 2012-01-18 ENCOUNTER — Ambulatory Visit (HOSPITAL_COMMUNITY)
Admission: RE | Admit: 2012-01-18 | Discharge: 2012-01-18 | Disposition: A | Discharge status: Home / Self Care | Payer: Medicare Other | Source: Ambulatory Visit | Attending: Family Medicine | Admitting: Family Medicine

## 2012-01-18 DIAGNOSIS — M818 Other osteoporosis without current pathological fracture: Secondary | ICD-10-CM | POA: Insufficient documentation

## 2012-01-18 DIAGNOSIS — M810 Age-related osteoporosis without current pathological fracture: Secondary | ICD-10-CM

## 2012-01-29 ENCOUNTER — Ambulatory Visit (INDEPENDENT_AMBULATORY_CARE_PROVIDER_SITE_OTHER): Payer: Medicare Other

## 2012-01-29 DIAGNOSIS — J45909 Unspecified asthma, uncomplicated: Secondary | ICD-10-CM

## 2012-01-29 MED ORDER — OMALIZUMAB 150 MG ~~LOC~~ SOLR
225.0000 mg | Freq: Once | SUBCUTANEOUS | Status: AC
  Administered 2012-01-29: 225 mg via SUBCUTANEOUS

## 2012-02-13 ENCOUNTER — Encounter: Payer: Self-pay | Admitting: Internal Medicine

## 2012-03-04 ENCOUNTER — Ambulatory Visit (INDEPENDENT_AMBULATORY_CARE_PROVIDER_SITE_OTHER): Payer: Medicare Other

## 2012-03-04 DIAGNOSIS — J45909 Unspecified asthma, uncomplicated: Secondary | ICD-10-CM

## 2012-03-05 DIAGNOSIS — J45909 Unspecified asthma, uncomplicated: Secondary | ICD-10-CM

## 2012-03-05 MED ORDER — OMALIZUMAB 150 MG ~~LOC~~ SOLR
225.0000 mg | Freq: Once | SUBCUTANEOUS | Status: AC
  Administered 2012-03-05: 225 mg via SUBCUTANEOUS

## 2012-03-12 ENCOUNTER — Encounter: Payer: Self-pay | Admitting: Internal Medicine

## 2012-03-12 ENCOUNTER — Ambulatory Visit (INDEPENDENT_AMBULATORY_CARE_PROVIDER_SITE_OTHER): Payer: Medicare Other | Admitting: Internal Medicine

## 2012-03-12 VITALS — BP 158/68 | HR 70 | Ht 60.0 in | Wt 145.0 lb

## 2012-03-12 DIAGNOSIS — J45909 Unspecified asthma, uncomplicated: Secondary | ICD-10-CM

## 2012-03-12 DIAGNOSIS — J309 Allergic rhinitis, unspecified: Secondary | ICD-10-CM

## 2012-03-12 DIAGNOSIS — Z23 Encounter for immunization: Secondary | ICD-10-CM

## 2012-03-12 MED ORDER — ALBUTEROL SULFATE HFA 108 (90 BASE) MCG/ACT IN AERS: 2.0000 | INHALATION_SPRAY | RESPIRATORY_TRACT | Status: DC | PRN

## 2012-03-12 NOTE — Patient Instructions (Addendum)
Albuterol HFA rescue inhaler refilled- Give the script to your drug store and ask them to put it on hold for you.  We can continue Xolair  Flu vax  Please call as needed

## 2012-03-12 NOTE — Progress Notes (Signed)
Patient ID: ANALIZ TVEDT, female    DOB: [birth year], 77 y.o.   MRN: [id number]  HPI 03/14/11- 77 year old female never smoker followed for chronic asthma, allergic rhinosinusitis complicated by GERD Last here March 14, 2010 Good year- still doing very well on Xolair shots monthly.  Continues Advair once daily but almost never needs to use her rescue inhaler. Continues flonase about every other day- I gave permission to try off it.  No recognized asthma in a very long time.   06/23/11- 77 year old female never smoker followed for chronic asthma, allergic rhinosinusitis complicated by GERD She has continued Xolair and it still works well for her. Has not needed Advair but still has prescription if she needs to restart. In the last 2 days she has started cough, dry or productive of yellow nonbloody sputum, some chills without fever, mild sore throat, no myalgias, no GI upset.  03/12/12- 77 year old female never smoker followed for chronic asthma(Xolair), allergic rhino-sinusitis complicated by GERD   PCP Dr Regino Schultze Still on Xolair and no flare ups since viral infection last winter.. Minor sneezing if she sits outdoors. Off of maintenance medications, never needing rescue inhaler. We discussed long-term use of Xolair and studies that are looking at that now. She is reluctant to come off of Xolair which she associates with significantly improved quality of life.  Review of Systems- see HPI Constitutional:   No-   weight loss, night sweats, fevers, chills, fatigue, lassitude. HEENT:   No-  headaches, difficulty swallowing, tooth/dental problems, +sore throat,       No-  sneezing, itching, ear ache, nasal congestion, post nasal drip,  CV:  No-   chest pain, orthopnea, PND, swelling in lower extremities, anasarca, dizziness, palpitations Resp: No-   shortness of breath with exertion or at rest.              No-  productive cough,  No non-productive cough,  No-  coughing up of blood.    No-   change in color of mucus.  No- wheezing.   Skin: No-   rash or lesions. GI:  No-   heartburn, indigestion, abdominal pain, nausea, vomiting,  GU: MS:  No-   joint pain or swelling. Neuro- grossly normal  Psych:  No- change in mood or affect. No depression or anxiety.  No memory loss.     Objective:   Physical Exam General- Alert, Oriented, Affect-appropriate, Distress- none acute  Medium build Skin- rash-none, lesions- none, excoriation- none Lymphadenopathy- none Head- atraumatic            Eyes- Gross vision intact, PERRLA, conjunctivae clear secretions            Ears- Hearing, canals normal            Nose- Clear, no-Septal dev, mucus, polyps, erosion, perforation             Throat- Mallampati III-IV , mucosa clear , drainage- none, tonsils- atrophic Neck- flexible , trachea midline, no stridor , thyroid nl, carotid no bruit Chest - symmetrical excursion , unlabored           Heart/CV- RRR , no murmur , no gallop  , no rub, nl s1 s2                           - JVD- none , edema- none, stasis changes- none, varices- none           Lung- clear  to P&A, wheeze- none, +dry cough , dullness-none, rub- none           Chest wall-  Abd-  Br/ Gen/ Rectal- Not done, not indicated Extrem- cyanosis- none, clubbing, none, atrophy- none, strength- nl Neuro- grossly intact to observation

## 2012-03-21 NOTE — Assessment & Plan Note (Signed)
Plan-continue Xolair. Refill prescription for rescue inhaler to hold. Flu vaccine

## 2012-03-21 NOTE — Assessment & Plan Note (Signed)
Minor rhinorrhea. Okay to use OTC antihistamine if needed.

## 2012-03-26 ENCOUNTER — Encounter: Payer: Self-pay | Admitting: *Deleted

## 2012-03-26 ENCOUNTER — Telehealth: Payer: Self-pay | Admitting: Internal Medicine

## 2012-03-26 MED ORDER — DOXYCYCLINE HYCLATE 100 MG PO CAPS: ORAL_CAPSULE | ORAL | Status: DC

## 2012-03-26 NOTE — Telephone Encounter (Signed)
Per CY-okay to give Doxycycline 100 mg #8 take 2 today then 1 daily until gone no refills.  

## 2012-03-26 NOTE — Telephone Encounter (Signed)
Pt c/o non prod cough and chest tightness for 2 days and doesn't want this to turn into anything else.  Has mild SOB with chest tightness.  Denies FCS or sinus symptoms. Last ov: 03-12-12    Next ov: 03-12-13 Allergies  Allergen Reactions  . Clarithromycin     REACTION: itching  . Penicillins     REACTION: hives

## 2012-03-26 NOTE — Telephone Encounter (Signed)
Patient aware of RX. RX sent.

## 2012-04-03 ENCOUNTER — Ambulatory Visit (INDEPENDENT_AMBULATORY_CARE_PROVIDER_SITE_OTHER): Payer: Medicare Other

## 2012-04-03 ENCOUNTER — Telehealth: Payer: Self-pay | Admitting: Internal Medicine

## 2012-04-03 DIAGNOSIS — J45909 Unspecified asthma, uncomplicated: Secondary | ICD-10-CM

## 2012-04-03 MED ORDER — PREDNISONE 10 MG PO TABS: ORAL_TABLET | ORAL | Status: DC

## 2012-04-03 NOTE — Telephone Encounter (Signed)
Per CY-okay to give Prednisone 10mg #20 take 4 x 2 days, 3 x 2 days, 2 x 2 days, 1 x 2 days, then stop no refills.  

## 2012-04-03 NOTE — Telephone Encounter (Signed)
Will forward to CDY now, ok per Katie 

## 2012-04-03 NOTE — Telephone Encounter (Signed)
Spoke with pt. She is c/o dry, hacky cough, wheezing, and chest tightness since 03/26/12- she called in on this date and CDY prescribed doxy. She states that this helped only slightly and is wanting something else called in since she is going out of town soon. Denies any CP, f/c/s, increased SOB. Will forward to doc of the day since CDY not here. Please advise, thanks! Allergies  Allergen Reactions  . Clarithromycin     REACTION: itching  . Penicillins     REACTION: hives

## 2012-04-03 NOTE — Telephone Encounter (Signed)
Rx was sent to pharm and pt made aware 

## 2012-04-04 MED ORDER — OMALIZUMAB 150 MG ~~LOC~~ SOLR
225.0000 mg | Freq: Once | SUBCUTANEOUS | Status: AC
  Administered 2012-04-04: 225 mg via SUBCUTANEOUS

## 2012-04-12 ENCOUNTER — Telehealth: Payer: Self-pay | Admitting: Internal Medicine

## 2012-04-12 NOTE — Telephone Encounter (Signed)
i have called these 2 meds to the pharmacy per the pts request and i have called and spoke with the pt and she is aware of these meds called to the pharmacy.  Nothing further is needed.

## 2012-04-12 NOTE — Telephone Encounter (Signed)
Last OV 03/12/12, next ov in 1 year. Pt called on 03-26-12 and was prescribed doxy course. Pt called back on 04-03-12 and stated she was some better but still had dry cough, wheezing and chest tightness so rx for pred taper was sent. Pt calling today stating she finished pred taper 2 days ago. Pt states she felt much improved while on the prednisone, but yesterday she began to have a sore throat, PND, productive cough with dark yellow phelgm, chest tightness, and wheezing. Pt has been using her neb meds without relief. Pt is currently in [place] so unable to come in for OV. Pt is requesting an rx be called in. Please advise.Carron Curie, CMA Allergies  Allergen Reactions  . Clarithromycin     REACTION: itching  . Penicillins     REACTION: hives

## 2012-04-12 NOTE — Telephone Encounter (Signed)
ATC, NA, no voicemail. WCB.  History: Pt called on 03-26-12 and was prescribed doxy. Pt called back on 04-03-12 and stated she was some better but still had dry cough, wheezing and chest tightness so rx for pred taper was sent. Pt calling back today. Carron Curie, CMA

## 2012-04-12 NOTE — Telephone Encounter (Signed)
Per CY-okay to give Zpak #1 take as directed no refills and Prednisone 10 mg #20 take 4 x 2 days, 3 x 2 days, 2 x 2 days, 1 x 2 days, then stop no refills.

## 2012-04-29 ENCOUNTER — Telehealth: Payer: Self-pay | Admitting: Internal Medicine

## 2012-04-29 NOTE — Telephone Encounter (Signed)
Spoke with pt and informed that we have no record of pt being on Neb with Albuterol..  Pt reports that she has had this for 5 years or more now but thinks that this may have been given by Dr Regino Schultze her PCP.  She is going to call his office for refill and let us know if she has any problems getting this from him.

## 2012-05-03 ENCOUNTER — Ambulatory Visit (INDEPENDENT_AMBULATORY_CARE_PROVIDER_SITE_OTHER): Payer: Medicare Other

## 2012-05-03 DIAGNOSIS — J45909 Unspecified asthma, uncomplicated: Secondary | ICD-10-CM

## 2012-05-03 MED ORDER — OMALIZUMAB 150 MG ~~LOC~~ SOLR
225.0000 mg | Freq: Once | SUBCUTANEOUS | Status: AC
  Administered 2012-05-03: 225 mg via SUBCUTANEOUS

## 2012-05-31 ENCOUNTER — Ambulatory Visit: Payer: Medicare Other

## 2012-06-03 ENCOUNTER — Ambulatory Visit (INDEPENDENT_AMBULATORY_CARE_PROVIDER_SITE_OTHER): Payer: Medicare Other

## 2012-06-03 DIAGNOSIS — J45909 Unspecified asthma, uncomplicated: Secondary | ICD-10-CM

## 2012-06-06 DIAGNOSIS — J45909 Unspecified asthma, uncomplicated: Secondary | ICD-10-CM

## 2012-06-06 MED ORDER — OMALIZUMAB 150 MG ~~LOC~~ SOLR
225.0000 mg | Freq: Once | SUBCUTANEOUS | Status: AC
  Administered 2012-06-06: 225 mg via SUBCUTANEOUS

## 2012-07-01 ENCOUNTER — Telehealth: Payer: Self-pay | Admitting: Internal Medicine

## 2012-07-01 MED ORDER — AZITHROMYCIN 250 MG PO TABS: ORAL_TABLET | ORAL | Status: DC

## 2012-07-01 NOTE — Telephone Encounter (Signed)
Offer Zpak 

## 2012-07-01 NOTE — Telephone Encounter (Signed)
Spoke with pt She is c/o prod cough with minimal thick, yellow sputum, chest tightness and mild increase in SOB She states neb helps some, but still not feeling 100 % Would like to know if needs abx She denies f/c/s, CP, sore throat, aches Last ov 03/12/12 Next ov 03/12/13 Allergies  Allergen Reactions  . Clarithromycin     REACTION: itching  . Penicillins     REACTION: hives

## 2012-07-01 NOTE — Telephone Encounter (Signed)
Thje pt is aware of CY recs and her prescription has been sent to her pharmacy.

## 2012-07-03 ENCOUNTER — Telehealth: Payer: Self-pay | Admitting: Internal Medicine

## 2012-07-03 ENCOUNTER — Ambulatory Visit (INDEPENDENT_AMBULATORY_CARE_PROVIDER_SITE_OTHER)
Admission: RE | Admit: 2012-07-03 | Discharge: 2012-07-03 | Disposition: A | Discharge status: Home / Self Care | Payer: Medicare Other | Source: Ambulatory Visit | Attending: Internal Medicine | Admitting: Internal Medicine

## 2012-07-03 ENCOUNTER — Ambulatory Visit (INDEPENDENT_AMBULATORY_CARE_PROVIDER_SITE_OTHER): Payer: Medicare Other | Admitting: Internal Medicine

## 2012-07-03 ENCOUNTER — Encounter: Payer: Self-pay | Admitting: Internal Medicine

## 2012-07-03 VITALS — BP 144/68 | HR 82 | Ht 60.0 in | Wt 145.8 lb

## 2012-07-03 DIAGNOSIS — J45998 Other asthma: Secondary | ICD-10-CM

## 2012-07-03 DIAGNOSIS — J209 Acute bronchitis, unspecified: Secondary | ICD-10-CM

## 2012-07-03 DIAGNOSIS — J45909 Unspecified asthma, uncomplicated: Secondary | ICD-10-CM

## 2012-07-03 MED ORDER — METHYLPREDNISOLONE ACETATE 80 MG/ML IJ SUSP
80.0000 mg | Freq: Once | INTRAMUSCULAR | Status: AC
  Administered 2012-07-03: 80 mg via INTRAMUSCULAR

## 2012-07-03 NOTE — Telephone Encounter (Signed)
I spoke with pt and per Florentina Addison. Okay for pt to come in at 11:30. Nothing further was needed

## 2012-07-03 NOTE — Progress Notes (Signed)
Patient ID: Deborah Branch, female    DOB: [birth year], 78 y.o.   MRN: [id number]  HPI 03/14/11- 78 year old female never smoker followed for chronic asthma, allergic rhinosinusitis complicated by GERD Last here March 14, 2010 Good year- still doing very well on Xolair shots monthly.  Continues Advair once daily but almost never needs to use her rescue inhaler. Continues flonase about every other day- I gave permission to try off it.  No recognized asthma in a very long time.   06/23/11- 78 year old female never smoker followed for chronic asthma, allergic rhinosinusitis complicated by GERD She has continued Xolair and it still works well for her. Has not needed Advair but still has prescription if she needs to restart. In the last 2 days she has started cough, dry or productive of yellow nonbloody sputum, some chills without fever, mild sore throat, no myalgias, no GI upset.  03/12/12- 78 year old female never smoker followed for chronic asthma(Xolair), allergic rhino-sinusitis complicated by GERD   PCP Dr Regino Schultze Still on Xolair and no flare ups since viral infection last winter.. Minor sneezing if she sits outdoors. Off of maintenance medications, never needing rescue inhaler. We discussed long-term use of Xolair and studies that are looking at that now. She is reluctant to come off of Xolair which she associates with significantly improved quality of life.  07/03/12- 78 year old female never smoker followed for chronic asthma(Xolair), allergic rhino-sinusitis complicated by GERD   PCP Dr Regino Schultze ACUTE VISIT: Increased wheezing and SOB, cough-productive-yellow, denies any chills/unsure of fevers. 2-3 days ago onset sore throat, cough yellow sputum, chest tight, wheeze. No fever, myalgias, GI. We sent Zpak. Her nebulizer is modest help only.  Review of Systems- see HPI Constitutional:   No-   weight loss, night sweats, fevers, chills, fatigue, lassitude. HEENT:   No-  headaches, difficulty  swallowing, tooth/dental problems, +sore throat,       No-  sneezing, itching, ear ache, +nasal congestion, post nasal drip,  CV:  No-   chest pain, orthopnea, PND, swelling in lower extremities, anasarca, dizziness, palpitations Resp: + shortness of breath with exertion or at rest.              +  productive cough,  No non-productive cough,  No-  coughing up of blood.              +  change in color of mucus.  + wheezing.   Skin: No-   rash or lesions. GI:  No-   heartburn, indigestion, abdominal pain, nausea, vomiting,  GU: MS:  No-   joint pain or swelling. Neuro- grossly normal  Psych:  No- change in mood or affect. No depression or anxiety.  No memory loss.     Objective:   Physical Exam General- Alert, Oriented, Affect-appropriate, Distress- none acute  Medium build Skin- rash-none, lesions- none, excoriation- none Lymphadenopathy- none Head- atraumatic            Eyes- Gross vision intact, PERRLA, conjunctivae clear secretions            Ears- Hearing, canals normal            Nose- Clear, no-Septal dev, mucus, polyps, erosion, perforation             Throat- Mallampati III-IV , mucosa clear- not red , drainage- none, tonsils- atrophic Neck- flexible , trachea midline, no stridor , thyroid nl, carotid no bruit Chest - symmetrical excursion , unlabored  Heart/CV- RRR , no murmur , no gallop  , no rub, nl s1 s2                           - JVD- none , edema- none, stasis changes- none, varices- none           Lung-  +wheezy cough , dullness-none, rub- none           Chest wall-  Abd-  Br/ Gen/ Rectal- Not done, not indicated Extrem- cyanosis- none, clubbing, none, atrophy- none, strength- nl Neuro- grossly intact to observation

## 2012-07-03 NOTE — Assessment & Plan Note (Signed)
Acute URI/ bronchitis syndrome with wheeze. Likely viral initially. Ok to defer Xolair until feeling better, but she is sure this helps long term. Plan- fluids and supportive care. Depomedrol, CXR, finish Zpak.

## 2012-07-03 NOTE — Patient Instructions (Addendum)
Order- CXR- dx acute bronchitis  Depo 22  Finish Zpak  Ok to put off Xolair till you feel better  Fluids, mucinex, comfort meds, avoid getting chilled or overly tired

## 2012-07-04 ENCOUNTER — Ambulatory Visit: Payer: Medicare Other

## 2012-07-09 ENCOUNTER — Ambulatory Visit (INDEPENDENT_AMBULATORY_CARE_PROVIDER_SITE_OTHER): Payer: Medicare Other

## 2012-07-09 ENCOUNTER — Telehealth: Payer: Self-pay | Admitting: Internal Medicine

## 2012-07-09 DIAGNOSIS — J45909 Unspecified asthma, uncomplicated: Secondary | ICD-10-CM

## 2012-07-09 NOTE — Telephone Encounter (Signed)
CXR still unsigned in Epic Please advise results, thanks!

## 2012-07-09 NOTE — Telephone Encounter (Signed)
CXR- was a little over-inflated, consistent with bronchitis, but no pneumonia

## 2012-07-09 NOTE — Telephone Encounter (Signed)
[email or web address].Deborah Branch, CMA  

## 2012-07-10 MED ORDER — OMALIZUMAB 150 MG ~~LOC~~ SOLR
225.0000 mg | Freq: Once | SUBCUTANEOUS | Status: AC
  Administered 2012-07-10: 225 mg via SUBCUTANEOUS

## 2012-07-10 NOTE — Telephone Encounter (Signed)
Called, spoke with pt.  Informed her of cxr results per Dr. Maple Hudson.  She verbalized understanding of this and voiced no further questions or concerns at this time.

## 2012-08-08 ENCOUNTER — Ambulatory Visit: Payer: Medicare Other

## 2012-08-12 ENCOUNTER — Ambulatory Visit (INDEPENDENT_AMBULATORY_CARE_PROVIDER_SITE_OTHER): Payer: Medicare Other

## 2012-08-12 DIAGNOSIS — J45909 Unspecified asthma, uncomplicated: Secondary | ICD-10-CM

## 2012-08-13 MED ORDER — OMALIZUMAB 150 MG ~~LOC~~ SOLR
225.0000 mg | Freq: Once | SUBCUTANEOUS | Status: AC
  Administered 2012-08-13: 225 mg via SUBCUTANEOUS

## 2012-09-11 ENCOUNTER — Ambulatory Visit (INDEPENDENT_AMBULATORY_CARE_PROVIDER_SITE_OTHER): Payer: Medicare Other

## 2012-09-11 DIAGNOSIS — J45909 Unspecified asthma, uncomplicated: Secondary | ICD-10-CM

## 2012-09-12 ENCOUNTER — Ambulatory Visit: Payer: Medicare Other

## 2012-09-13 DIAGNOSIS — J45909 Unspecified asthma, uncomplicated: Secondary | ICD-10-CM

## 2012-09-13 MED ORDER — OMALIZUMAB 150 MG ~~LOC~~ SOLR
225.0000 mg | Freq: Once | SUBCUTANEOUS | Status: AC
  Administered 2012-09-13: 225 mg via SUBCUTANEOUS

## 2012-10-10 ENCOUNTER — Ambulatory Visit (INDEPENDENT_AMBULATORY_CARE_PROVIDER_SITE_OTHER): Payer: Medicare Other

## 2012-10-10 DIAGNOSIS — J45909 Unspecified asthma, uncomplicated: Secondary | ICD-10-CM

## 2012-10-14 MED ORDER — OMALIZUMAB 150 MG ~~LOC~~ SOLR
225.0000 mg | Freq: Once | SUBCUTANEOUS | Status: AC
  Administered 2012-10-14: 225 mg via SUBCUTANEOUS

## 2012-11-07 ENCOUNTER — Ambulatory Visit (INDEPENDENT_AMBULATORY_CARE_PROVIDER_SITE_OTHER): Payer: Medicare Other

## 2012-11-07 DIAGNOSIS — J45909 Unspecified asthma, uncomplicated: Secondary | ICD-10-CM

## 2012-11-07 MED ORDER — OMALIZUMAB 150 MG ~~LOC~~ SOLR
225.0000 mg | Freq: Once | SUBCUTANEOUS | Status: AC
  Administered 2012-11-07: 225 mg via SUBCUTANEOUS

## 2012-12-05 ENCOUNTER — Ambulatory Visit (INDEPENDENT_AMBULATORY_CARE_PROVIDER_SITE_OTHER): Payer: Medicare Other

## 2012-12-05 DIAGNOSIS — J45909 Unspecified asthma, uncomplicated: Secondary | ICD-10-CM

## 2012-12-05 MED ORDER — OMALIZUMAB 150 MG ~~LOC~~ SOLR
225.0000 mg | Freq: Once | SUBCUTANEOUS | Status: AC
  Administered 2012-12-05: 225 mg via SUBCUTANEOUS

## 2013-01-02 ENCOUNTER — Ambulatory Visit (INDEPENDENT_AMBULATORY_CARE_PROVIDER_SITE_OTHER): Payer: Medicare Other

## 2013-01-02 DIAGNOSIS — J45909 Unspecified asthma, uncomplicated: Secondary | ICD-10-CM

## 2013-01-03 MED ORDER — OMALIZUMAB 150 MG ~~LOC~~ SOLR
225.0000 mg | Freq: Once | SUBCUTANEOUS | Status: AC
  Administered 2013-01-03: 225 mg via SUBCUTANEOUS

## 2013-01-27 ENCOUNTER — Telehealth: Payer: Self-pay | Admitting: Internal Medicine

## 2013-01-27 MED ORDER — PREDNISONE 10 MG PO TABS: ORAL_TABLET | ORAL | Status: DC

## 2013-01-27 NOTE — Telephone Encounter (Signed)
Spoke with the pt and notified of recs per CDY  She verbalized understanding and states nothing further needed  Rx was sent to pharm  

## 2013-01-27 NOTE — Telephone Encounter (Signed)
Per CY- try Prednisone 10 mg #20 take 4 x 2 days, 3 x 2 days , 2 x 2 days, 1 x 2 days then stop no refills. Call if no better.

## 2013-01-27 NOTE — Telephone Encounter (Signed)
I spoke with Deborah Branch. She c/o wheezing, chest tx, increase SOB, occasional dry hacking cough x couple weeks getting worse. She stated she used her rescue inhaler (something she has not had to do in a very long time). She is wanting recs or if she needs to be seen sooner than her next apt. Please advise Dr. Maple Hudson thanks Pending 03/12/13 Last OV 07/03/12 Allergies  Allergen Reactions  . Clarithromycin     REACTION: itching  . Penicillins     REACTION: hives

## 2013-01-31 ENCOUNTER — Ambulatory Visit (INDEPENDENT_AMBULATORY_CARE_PROVIDER_SITE_OTHER): Payer: Medicare Other

## 2013-01-31 DIAGNOSIS — J45909 Unspecified asthma, uncomplicated: Secondary | ICD-10-CM

## 2013-02-03 MED ORDER — OMALIZUMAB 150 MG ~~LOC~~ SOLR
225.0000 mg | Freq: Once | SUBCUTANEOUS | Status: AC
  Administered 2013-02-03: 225 mg via SUBCUTANEOUS

## 2013-02-06 ENCOUNTER — Encounter: Payer: Self-pay | Admitting: Gastroenterology

## 2013-03-12 ENCOUNTER — Encounter: Payer: Self-pay | Admitting: Internal Medicine

## 2013-03-12 ENCOUNTER — Encounter: Payer: Self-pay | Admitting: Gastroenterology

## 2013-03-12 ENCOUNTER — Ambulatory Visit (INDEPENDENT_AMBULATORY_CARE_PROVIDER_SITE_OTHER): Payer: Medicare Other | Admitting: Internal Medicine

## 2013-03-12 ENCOUNTER — Ambulatory Visit (INDEPENDENT_AMBULATORY_CARE_PROVIDER_SITE_OTHER): Payer: Medicare Other

## 2013-03-12 VITALS — BP 122/70 | HR 65 | Ht 60.0 in | Wt 150.0 lb

## 2013-03-12 DIAGNOSIS — J45998 Other asthma: Secondary | ICD-10-CM

## 2013-03-12 DIAGNOSIS — Z23 Encounter for immunization: Secondary | ICD-10-CM

## 2013-03-12 DIAGNOSIS — J45909 Unspecified asthma, uncomplicated: Secondary | ICD-10-CM

## 2013-03-12 DIAGNOSIS — J309 Allergic rhinitis, unspecified: Secondary | ICD-10-CM

## 2013-03-12 MED ORDER — MOMETASONE FUROATE 50 MCG/ACT NA SUSP: 2.0000 | Freq: Every day | NASAL | Status: DC

## 2013-03-12 MED ORDER — OMALIZUMAB 150 MG ~~LOC~~ SOLR
225.0000 mg | Freq: Once | SUBCUTANEOUS | Status: AC
  Administered 2013-03-12: 225 mg via SUBCUTANEOUS

## 2013-03-12 NOTE — Progress Notes (Signed)
Patient ID: Deborah Branch, female    DOB: [birth year], 78 y.o.   MRN: [id number]  HPI 03/14/11- 78 year old female never smoker followed for chronic asthma, allergic rhinosinusitis complicated by GERD Last here March 14, 2010 Good year- still doing very well on Xolair shots monthly.  Continues Advair once daily but almost never needs to use her rescue inhaler. Continues flonase about every other day- I gave permission to try off it.  No recognized asthma in a very long time.   06/23/11- 78 year old female never smoker followed for chronic asthma, allergic rhinosinusitis complicated by GERD She has continued Xolair and it still works well for her. Has not needed Advair but still has prescription if she needs to restart. In the last 2 days she has started cough, dry or productive of yellow nonbloody sputum, some chills without fever, mild sore throat, no myalgias, no GI upset.  03/12/12- 78 year old female never smoker followed for chronic asthma(Xolair), allergic rhino-sinusitis complicated by GERD   PCP Dr Regino Schultze Still on Xolair and no flare ups since viral infection last winter.. Minor sneezing if she sits outdoors. Off of maintenance medications, never needing rescue inhaler. We discussed long-term use of Xolair and studies that are looking at that now. She is reluctant to come off of Xolair which she associates with significantly improved quality of life.  07/03/12- 78 year old female never smoker followed for chronic asthma(Xolair), allergic rhino-sinusitis complicated by GERD   PCP Dr Regino Schultze ACUTE VISIT: Increased wheezing and SOB, cough-productive-yellow, denies any chills/unsure of fevers. 2-3 days ago onset sore throat, cough yellow sputum, chest tight, wheeze. No fever, myalgias, GI. We sent Zpak. Her nebulizer is modest help only.  03/12/13- 78 year old female never smoker followed for chronic asthma(Xolair), allergic rhino-sinusitis complicated by GERD   PCP Dr Regino Schultze FOLLOWS  FOR: recently had flare up and used Prednisone; continues to take Xolair and no troubles. Flare of asthma several weeks ago, reason unclear. Responded well to prednisone which is the first time she has needed it in a long time. Occasional Zyrtec for rhinitis.  Review of Systems- see HPI Constitutional:   No-   weight loss, night sweats, fevers, chills, fatigue, lassitude. HEENT:   No-  headaches, difficulty swallowing, tooth/dental problems, no-sore throat,       No-  sneezing, itching, ear ache, +nasal congestion, post nasal drip,  CV:  No-   chest pain, orthopnea, PND, swelling in lower extremities, anasarca, dizziness, palpitations Resp: no-shortness of breath with exertion or at rest.              No-  productive cough,  No non-productive cough,  No-  coughing up of blood.            No- change in color of mucus.  No-wheezing.   Skin: No-   rash or lesions. GI:  No-   heartburn, indigestion, abdominal pain, nausea, vomiting,  GU: MS:  No-   joint pain or swelling. Neuro- grossly normal  Psych:  No- change in mood or affect. No depression or anxiety.  No memory loss.     Objective:   Physical Exam General- Alert, Oriented, Affect-appropriate, Distress- none acute  Medium build Skin- rash-none, lesions- none, excoriation- none Lymphadenopathy- none Head- atraumatic            Eyes- Gross vision intact, PERRLA, conjunctivae clear secretions            Ears- Hearing, canals normal  Nose- + minor sniffing, no-Septal dev, mucus, polyps, erosion, perforation             Throat- Mallampati III-IV , mucosa clear- not red , drainage- none, tonsils- atrophic Neck- flexible , trachea midline, no stridor , thyroid nl, carotid no bruit Chest - symmetrical excursion , unlabored           Heart/CV- RRR , no murmur , no gallop  , no rub, nl s1 s2                           - JVD- none , edema- none, stasis changes- none, varices- none           Lung-  Clear, wheeze-none , dullness-none,  rub- none           Chest wall-  Abd-  Br/ Gen/ Rectal- Not done, not indicated Extrem- cyanosis- none, clubbing, none, atrophy- none, strength- nl Neuro- grossly intact to observation

## 2013-03-12 NOTE — Patient Instructions (Addendum)
We can continue Xolair  Flu vax  Sample and script for Nasonex nasal steroid spray to use through allergy season       1-2 puffs each nostril once daily at bedtime  Please call as needed

## 2013-03-20 NOTE — Assessment & Plan Note (Signed)
Plan-resume nasal steroid using sample Nasonex we have here

## 2013-03-20 NOTE — Assessment & Plan Note (Signed)
Very good control. 1 nonspecific breakthrough episode treated with prednisone earlier this year. Plan-continue Xolair, flu vaccine

## 2013-04-09 ENCOUNTER — Ambulatory Visit (INDEPENDENT_AMBULATORY_CARE_PROVIDER_SITE_OTHER): Payer: Medicare Other

## 2013-04-09 DIAGNOSIS — J45909 Unspecified asthma, uncomplicated: Secondary | ICD-10-CM

## 2013-04-09 DIAGNOSIS — J45998 Other asthma: Secondary | ICD-10-CM

## 2013-04-09 MED ORDER — OMALIZUMAB 150 MG ~~LOC~~ SOLR
225.0000 mg | Freq: Once | SUBCUTANEOUS | Status: AC
  Administered 2013-04-09: 225 mg via SUBCUTANEOUS

## 2013-05-08 ENCOUNTER — Ambulatory Visit (INDEPENDENT_AMBULATORY_CARE_PROVIDER_SITE_OTHER): Payer: Medicare Other

## 2013-05-08 DIAGNOSIS — J45909 Unspecified asthma, uncomplicated: Secondary | ICD-10-CM

## 2013-05-08 DIAGNOSIS — J45998 Other asthma: Secondary | ICD-10-CM

## 2013-05-09 MED ORDER — OMALIZUMAB 150 MG ~~LOC~~ SOLR
225.0000 mg | Freq: Once | SUBCUTANEOUS | Status: AC
  Administered 2013-05-09: 225 mg via SUBCUTANEOUS

## 2013-05-14 ENCOUNTER — Ambulatory Visit (AMBULATORY_SURGERY_CENTER): Payer: Self-pay | Admitting: *Deleted

## 2013-05-14 VITALS — Ht 60.0 in | Wt 151.4 lb

## 2013-05-14 DIAGNOSIS — Z86010 Personal history of colonic polyps: Secondary | ICD-10-CM

## 2013-05-14 MED ORDER — MOVIPREP 100 G PO SOLR: ORAL | Status: DC

## 2013-05-14 NOTE — Progress Notes (Signed)
No allergies to eggs or soy. No problems with anesthesia.  

## 2013-05-15 ENCOUNTER — Encounter: Payer: Self-pay | Admitting: Gastroenterology

## 2013-06-04 ENCOUNTER — Ambulatory Visit (AMBULATORY_SURGERY_CENTER): Payer: Medicare Other | Admitting: Gastroenterology

## 2013-06-04 ENCOUNTER — Encounter: Payer: Self-pay | Admitting: Gastroenterology

## 2013-06-04 VITALS — BP 163/81 | HR 74 | Temp 96.7°F | Resp 19 | Ht 60.0 in | Wt 151.0 lb

## 2013-06-04 DIAGNOSIS — Z86010 Personal history of colonic polyps: Secondary | ICD-10-CM

## 2013-06-04 MED ORDER — SODIUM CHLORIDE 0.9 % IV SOLN: 500.0000 mL | INTRAVENOUS | Status: DC

## 2013-06-04 NOTE — Op Note (Signed)
[facility] [address] [place], [address]   COLONOSCOPY PROCEDURE REPORT  PATIENT: Deborah, Branch  MR#: [id number] BIRTHDATE: 03-15-1935 , 78  yrs. old GENDER: Female ENDOSCOPIST: Meryl Dare, MD, [facility] PROCEDURE DATE:  06/04/2013 PROCEDURE:   Colonoscopy, surveillance First Screening Colonoscopy - Avg.  risk and is 50 yrs.  old or older - No.  Prior Negative Screening - Now for repeat screening. N/A  History of Adenoma - Now for follow-up colonoscopy & has been > or = to 3 yrs.  Yes hx of adenoma.  Has been 3 or more years since last colonoscopy.  Polyps Removed Today? No.  Recommend repeat exam, <10 yrs? No. ASA CLASS:   Class II INDICATIONS:Patient's personal history of adenomatous colon polyps.  MEDICATIONS: MAC sedation, administered by CRNA and propofol (Diprivan) 100mg  IV DESCRIPTION OF PROCEDURE:   After the risks benefits and alternatives of the procedure were thoroughly explained, informed consent was obtained.  A digital rectal exam revealed no abnormalities of the rectum.   The LB [id number]  endoscope was introduced through the anus and advanced to the cecum, which was identified by both the appendix and ileocecal valve. No adverse events experienced.   The quality of the prep was excellent, using MoviPrep  The instrument was then slowly withdrawn as the colon was fully examined.  COLON FINDINGS: Moderate diverticulosis was noted in the sigmoid colon.   The colon was otherwise normal.  There was no diverticulosis, inflammation, polyps or cancers unless previously stated.  Retroflexed views revealed no abnormalities. The time to cecum=2 minutes 10 seconds.  Withdrawal time=9 minutes 38 seconds. The scope was withdrawn and the procedure completed. COMPLICATIONS: There were no complications.  ENDOSCOPIC IMPRESSION: 1.   Moderate diverticulosis in the sigmoid colon 2.   The colon was otherwise normal  RECOMMENDATIONS: 1.  High fiber  diet with liberal fluid intake. 2.  Given your age, you will not need another colonoscopy for colon cancer screening or polyp surveillance.  These types of tests usually stop around the age 41.  eSigned:  Meryl Dare, MD, [facility] 06/04/2013 9:47 AM   cc: Karleen Hampshire, MD

## 2013-06-04 NOTE — Progress Notes (Signed)
Patient did not experience any of the following events: a burn prior to discharge; a fall within the facility; wrong site/side/patient/procedure/implant event; or a hospital transfer or hospital admission upon discharge from the facility. (G8907) Patient did not have preoperative order for IV antibiotic SSI prophylaxis. (G8918)  

## 2013-06-04 NOTE — Patient Instructions (Signed)
Discharge instructions given with verbal understanding. Handouts on diverticulosis and a high fiber diet. Resume previous medications. YOU HAD AN ENDOSCOPIC PROCEDURE TODAY AT THE [facility]: Refer to the procedure report that was given to you for any specific questions about what was found during the examination.  If the procedure report does not answer your questions, please call your gastroenterologist to clarify.  If you requested that your care partner not be given the details of your procedure findings, then the procedure report has been included in a sealed envelope for you to review at your convenience later.  YOU SHOULD EXPECT: Some feelings of bloating in the abdomen. Passage of more gas than usual.  Walking can help get rid of the air that was put into your GI tract during the procedure and reduce the bloating. If you had a lower endoscopy (such as a colonoscopy or flexible sigmoidoscopy) you may notice spotting of blood in your stool or on the toilet paper. If you underwent a bowel prep for your procedure, then you may not have a normal bowel movement for a few days.  DIET: Your first meal following the procedure should be a light meal and then it is ok to progress to your normal diet.  A half-sandwich or bowl of soup is an example of a good first meal.  Heavy or fried foods are harder to digest and may make you feel nauseous or bloated.  Likewise meals heavy in dairy and vegetables can cause extra gas to form and this can also increase the bloating.  Drink plenty of fluids but you should avoid alcoholic beverages for 24 hours.  ACTIVITY: Your care partner should take you home directly after the procedure.  You should plan to take it easy, moving slowly for the rest of the day.  You can resume normal activity the day after the procedure however you should NOT DRIVE or use heavy machinery for 24 hours (because of the sedation medicines used during the test).    SYMPTOMS TO REPORT  IMMEDIATELY: A gastroenterologist can be reached at any hour.  During normal business hours, 8:30 AM to 5:00 PM Monday through Friday, call [phone].  After hours and on weekends, please call the GI answering service at [phone] who will take a message and have the physician on call contact you.   Following lower endoscopy (colonoscopy or flexible sigmoidoscopy):  Excessive amounts of blood in the stool  Significant tenderness or worsening of abdominal pains  Swelling of the abdomen that is new, acute  Fever of 100F or higher FOLLOW UP: If any biopsies were taken you will be contacted by phone or by letter within the next 1-3 weeks.  Call your gastroenterologist if you have not heard about the biopsies in 3 weeks.  Our staff will call the home number listed on your records the next business day following your procedure to check on you and address any questions or concerns that you may have at that time regarding the information given to you following your procedure. This is a courtesy call and so if there is no answer at the home number and we have not heard from you through the emergency physician on call, we will assume that you have returned to your regular daily activities without incident.  SIGNATURES/CONFIDENTIALITY: You and/or your care partner have signed paperwork which will be entered into your electronic medical record.  These signatures attest to the fact that that the information above on your After   Visit Summary has been reviewed and is understood.  Full responsibility of the confidentiality of this discharge information lies with you and/or your care-partner. 

## 2013-06-04 NOTE — Progress Notes (Signed)
Lidocaine-40mg IV prior to Propofol InductionPropofol given over incremental dosages 

## 2013-06-05 ENCOUNTER — Telehealth: Payer: Self-pay | Admitting: *Deleted

## 2013-06-05 NOTE — Telephone Encounter (Signed)
  Follow up Call-  Call back number 06/04/2013  Post procedure Call Back phone  # [phone]  Permission to leave phone message Yes     Patient questions:  Do you have a fever, pain , or abdominal swelling? no Pain Score  0 *  Have you tolerated food without any problems? yes  Have you been able to return to your normal activities? yes  Do you have any questions about your discharge instructions: Diet   no Medications  no Follow up visit  no  Do you have questions or concerns about your Care? no  Actions: * If pain score is 4 or above: No action needed, pain <4.

## 2013-06-09 ENCOUNTER — Ambulatory Visit (INDEPENDENT_AMBULATORY_CARE_PROVIDER_SITE_OTHER): Payer: Medicare Other

## 2013-06-09 DIAGNOSIS — J45909 Unspecified asthma, uncomplicated: Secondary | ICD-10-CM

## 2013-06-09 MED ORDER — OMALIZUMAB 150 MG ~~LOC~~ SOLR
225.0000 mg | Freq: Once | SUBCUTANEOUS | Status: AC
  Administered 2013-06-09: 225 mg via SUBCUTANEOUS

## 2013-06-10 ENCOUNTER — Telehealth: Payer: Self-pay | Admitting: Internal Medicine

## 2013-06-10 MED ORDER — FLUTICASONE PROPIONATE 50 MCG/ACT NA SUSP: 1.0000 | Freq: Every day | NASAL | Status: DC

## 2013-06-10 NOTE — Telephone Encounter (Signed)
Spoke to pt. States that Nasonex is getting to expensive, she needs a generic Has used Flonase in the past. Please advise on alternative medication to Nasonex.  Allergies  Allergen Reactions  . Clarithromycin     REACTION: itching  . Penicillins     REACTION: hives   Current Outpatient Prescriptions on File Prior to Visit  Medication Sig Dispense Refill  . albuterol (PROAIR HFA) 108 (90 BASE) MCG/ACT inhaler Inhale 2 puffs into the lungs every 4 (four) hours as needed for wheezing or shortness of breath.  1 Inhaler  prn  . aspirin 81 MG tablet Take 81 mg by mouth daily.        . Calcium Carbonate (CALTRATE 600) 1500 MG TABS Take 1 tablet by mouth daily.        . cetirizine (ZYRTEC) 10 MG tablet Take 10 mg by mouth daily.      . Cholecalciferol (VITAMIN D) 2000 UNITS CAPS Take 1 capsule by mouth daily.        Marland Kitchen diltiazem (CARDIZEM CD) 180 MG 24 hr capsule Take 180 mg by mouth daily.        . mometasone (NASONEX) 50 MCG/ACT nasal spray Place 2 sprays into the nose daily. At bedtime  17 g  11  . Multiple Vitamin (MULTIVITAMIN) tablet Take 1 tablet by mouth daily.        Marland Kitchen omalizumab (XOLAIR) 150 MG injection Inject 225 mg into the skin every 30 (thirty) days.        . Omega-3 Fatty Acids (FISH OIL) 1200 MG CAPS Take 1 capsule by mouth daily.        Marland Kitchen omeprazole (PRILOSEC) 20 MG capsule Take 20 mg by mouth daily.        . simvastatin (ZOCOR) 20 MG tablet Take 20 mg by mouth at bedtime.         No current facility-administered medications on file prior to visit.    CY - please advise. Thanks.

## 2013-06-10 NOTE — Telephone Encounter (Signed)
Ok to Rx flonase # 1    1-2 puffs each nostril once daily. As an alternative, she might want to look at otc Nasacort.

## 2013-06-10 NOTE — Telephone Encounter (Signed)
Patient returning call.

## 2013-06-10 NOTE — Telephone Encounter (Signed)
LMTC x 1  

## 2013-06-10 NOTE — Telephone Encounter (Signed)
Pt is aware of recs. RX for flonase called in. Nothing further needed

## 2013-07-04 ENCOUNTER — Telehealth: Payer: Self-pay | Admitting: Internal Medicine

## 2013-07-04 MED ORDER — PREDNISONE 10 MG PO TABS: ORAL_TABLET | ORAL | Status: DC

## 2013-07-04 NOTE — Telephone Encounter (Signed)
Called spoke with pt. She reports x 1 week she has had to use her nebulizer d/t her breathing. C/o chest tx off and on during the day, wheezing, increase SOB anytime, very slight dry cough. No fever, no chills, no body aches, no nausea, no vomiting. She is requesting further recs. Please advise Dr. Maple HudsonYoung [facility] Allergies  Allergen Reactions  . Clarithromycin     REACTION: itching  . Penicillins     REACTION: hives    Current Outpatient Prescriptions on File Prior to Visit  Medication Sig Dispense Refill  . albuterol (PROAIR HFA) 108 (90 BASE) MCG/ACT inhaler Inhale 2 puffs into the lungs every 4 (four) hours as needed for wheezing or shortness of breath.  1 Inhaler  prn  . aspirin 81 MG tablet Take 81 mg by mouth daily.        . Calcium Carbonate (CALTRATE 600) 1500 MG TABS Take 1 tablet by mouth daily.        . cetirizine (ZYRTEC) 10 MG tablet Take 10 mg by mouth daily.      . Cholecalciferol (VITAMIN D) 2000 UNITS CAPS Take 1 capsule by mouth daily.        Marland Kitchen. diltiazem (CARDIZEM CD) 180 MG 24 hr capsule Take 180 mg by mouth daily.        . fluticasone (FLONASE) 50 MCG/ACT nasal spray Place 1-2 sprays into both nostrils daily.  16 g  2  . mometasone (NASONEX) 50 MCG/ACT nasal spray Place 2 sprays into the nose daily. At bedtime  17 g  11  . Multiple Vitamin (MULTIVITAMIN) tablet Take 1 tablet by mouth daily.        Marland Kitchen. omalizumab (XOLAIR) 150 MG injection Inject 225 mg into the skin every 30 (thirty) days.        . Omega-3 Fatty Acids (FISH OIL) 1200 MG CAPS Take 1 capsule by mouth daily.        Marland Kitchen. omeprazole (PRILOSEC) 20 MG capsule Take 20 mg by mouth daily.        . simvastatin (ZOCOR) 20 MG tablet Take 20 mg by mouth at bedtime.         No current facility-administered medications on file prior to visit.

## 2013-07-04 NOTE — Telephone Encounter (Signed)
Per CY-give patient Prednisone 10 mg #20 take 4 x 2 days, 3 x 2 days, 2 x 2 days, 1 x 2 days, then stop no refills. Thanks.

## 2013-07-04 NOTE — Telephone Encounter (Signed)
SPoke with pt. Aware of recs. RX has been sent/. Nothing further needed

## 2013-07-04 NOTE — Telephone Encounter (Signed)
Called and spoke w/ spouse. Pt just left and so he will let her know we call when she gets back

## 2013-07-04 NOTE — Telephone Encounter (Signed)
Returning call can be reached at [phone] or [phone].Deborah EvertsJuanita S Davis

## 2013-07-07 ENCOUNTER — Ambulatory Visit: Payer: Medicare Other

## 2013-07-08 ENCOUNTER — Ambulatory Visit (INDEPENDENT_AMBULATORY_CARE_PROVIDER_SITE_OTHER): Payer: Medicare Other

## 2013-07-08 DIAGNOSIS — J45909 Unspecified asthma, uncomplicated: Secondary | ICD-10-CM

## 2013-07-11 MED ORDER — OMALIZUMAB 150 MG ~~LOC~~ SOLR
225.0000 mg | Freq: Once | SUBCUTANEOUS | Status: AC
  Administered 2013-07-11: 225 mg via SUBCUTANEOUS

## 2013-07-17 ENCOUNTER — Encounter: Payer: Self-pay | Admitting: Internal Medicine

## 2013-08-06 ENCOUNTER — Ambulatory Visit: Payer: Medicare Other

## 2013-08-07 ENCOUNTER — Ambulatory Visit (INDEPENDENT_AMBULATORY_CARE_PROVIDER_SITE_OTHER): Payer: Medicare Other

## 2013-08-07 DIAGNOSIS — J45909 Unspecified asthma, uncomplicated: Secondary | ICD-10-CM

## 2013-08-07 MED ORDER — OMALIZUMAB 150 MG ~~LOC~~ SOLR
225.0000 mg | Freq: Once | SUBCUTANEOUS | Status: AC
  Administered 2013-08-07: 225 mg via SUBCUTANEOUS

## 2013-08-08 ENCOUNTER — Ambulatory Visit: Payer: Medicare Other

## 2013-09-08 ENCOUNTER — Ambulatory Visit (INDEPENDENT_AMBULATORY_CARE_PROVIDER_SITE_OTHER): Payer: Medicare Other

## 2013-09-08 DIAGNOSIS — J45909 Unspecified asthma, uncomplicated: Secondary | ICD-10-CM

## 2013-09-09 MED ORDER — OMALIZUMAB 150 MG ~~LOC~~ SOLR
225.0000 mg | Freq: Once | SUBCUTANEOUS | Status: AC
  Administered 2013-09-09: 225 mg via SUBCUTANEOUS

## 2013-09-17 ENCOUNTER — Telehealth: Payer: Self-pay | Admitting: Internal Medicine

## 2013-09-17 MED ORDER — PREDNISONE 20 MG PO TABS: 20.0000 mg | ORAL_TABLET | Freq: Every day | ORAL | Status: DC

## 2013-09-17 NOTE — Telephone Encounter (Signed)
Per CDY call in pred 20 mg qd #5  Called made pt aware of recs. Nothing further needed

## 2013-09-17 NOTE — Telephone Encounter (Signed)
Pt has called back. °

## 2013-09-17 NOTE — Telephone Encounter (Signed)
Spoke with pt. C/o wheezing, Slight dry cough, chest tx, lots of sneezing x several days. She reports she has been taking zytrec. Denies any watery/itchy eyes, no f/c/s/n/v. Please advise Dr. Maple HudsonYoung thanks  Allergies  Allergen Reactions  . Clarithromycin     REACTION: itching  . Penicillins     REACTION: hives    Current Outpatient Prescriptions on File Prior to Visit  Medication Sig Dispense Refill  . albuterol (PROAIR HFA) 108 (90 BASE) MCG/ACT inhaler Inhale 2 puffs into the lungs every 4 (four) hours as needed for wheezing or shortness of breath.  1 Inhaler  prn  . aspirin 81 MG tablet Take 81 mg by mouth daily.        . Calcium Carbonate (CALTRATE 600) 1500 MG TABS Take 1 tablet by mouth daily.        . cetirizine (ZYRTEC) 10 MG tablet Take 10 mg by mouth daily.      . Cholecalciferol (VITAMIN D) 2000 UNITS CAPS Take 1 capsule by mouth daily.        Marland Kitchen. diltiazem (CARDIZEM CD) 180 MG 24 hr capsule Take 180 mg by mouth daily.        . fluticasone (FLONASE) 50 MCG/ACT nasal spray Place 1-2 sprays into both nostrils daily.  16 g  2  . mometasone (NASONEX) 50 MCG/ACT nasal spray Place 2 sprays into the nose daily. At bedtime  17 g  11  . Multiple Vitamin (MULTIVITAMIN) tablet Take 1 tablet by mouth daily.        Marland Kitchen. omalizumab (XOLAIR) 150 MG injection Inject 225 mg into the skin every 30 (thirty) days.        . Omega-3 Fatty Acids (FISH OIL) 1200 MG CAPS Take 1 capsule by mouth daily.        Marland Kitchen. omeprazole (PRILOSEC) 20 MG capsule Take 20 mg by mouth daily.        . predniSONE (DELTASONE) 10 MG tablet Take 4 tabs daily x 2 days, 3 tabs daily x 2 days, 2 tabs daily x 2 days, 1 tab daily x 2 days then stop  20 tablet  0  . simvastatin (ZOCOR) 20 MG tablet Take 20 mg by mouth at bedtime.         No current facility-administered medications on file prior to visit.

## 2013-09-19 ENCOUNTER — Encounter: Payer: Self-pay | Admitting: Internal Medicine

## 2013-10-08 ENCOUNTER — Ambulatory Visit (INDEPENDENT_AMBULATORY_CARE_PROVIDER_SITE_OTHER): Payer: Medicare Other

## 2013-10-08 DIAGNOSIS — J45909 Unspecified asthma, uncomplicated: Secondary | ICD-10-CM

## 2013-10-09 MED ORDER — OMALIZUMAB 150 MG ~~LOC~~ SOLR
225.0000 mg | Freq: Once | SUBCUTANEOUS | Status: AC
  Administered 2013-10-09: 225 mg via SUBCUTANEOUS

## 2013-10-24 ENCOUNTER — Telehealth: Payer: Self-pay | Admitting: Internal Medicine

## 2013-10-24 MED ORDER — PREDNISONE 10 MG PO TABS: ORAL_TABLET | ORAL | Status: DC

## 2013-10-24 NOTE — Telephone Encounter (Signed)
atc na wcb 

## 2013-10-24 NOTE — Telephone Encounter (Signed)
Called and spoke with pt and she stated that she is going [place] the beach for 2 weeks and is requesting a pred taper to take with her just in case she may need this.  CY please advise. Thanks  Last ov--03/12/13 Next ov--03/12/14  Allergies  Allergen Reactions  . Clarithromycin     REACTION: itching  . Penicillins     REACTION: hives     Current Outpatient Prescriptions on File Prior to Visit  Medication Sig Dispense Refill  . albuterol (PROAIR HFA) 108 (90 BASE) MCG/ACT inhaler Inhale 2 puffs into the lungs every 4 (four) hours as needed for wheezing or shortness of breath.  1 Inhaler  prn  . aspirin 81 MG tablet Take 81 mg by mouth daily.        . Calcium Carbonate (CALTRATE 600) 1500 MG TABS Take 1 tablet by mouth daily.        . cetirizine (ZYRTEC) 10 MG tablet Take 10 mg by mouth daily.      . Cholecalciferol (VITAMIN D) 2000 UNITS CAPS Take 1 capsule by mouth daily.        Marland Kitchen. diltiazem (CARDIZEM CD) 180 MG 24 hr capsule Take 180 mg by mouth daily.        . fluticasone (FLONASE) 50 MCG/ACT nasal spray Place 1-2 sprays into both nostrils daily.  16 g  2  . mometasone (NASONEX) 50 MCG/ACT nasal spray Place 2 sprays into the nose daily. At bedtime  17 g  11  . Multiple Vitamin (MULTIVITAMIN) tablet Take 1 tablet by mouth daily.        Marland Kitchen. omalizumab (XOLAIR) 150 MG injection Inject 225 mg into the skin every 30 (thirty) days.        . Omega-3 Fatty Acids (FISH OIL) 1200 MG CAPS Take 1 capsule by mouth daily.        Marland Kitchen. omeprazole (PRILOSEC) 20 MG capsule Take 20 mg by mouth daily.        . predniSONE (DELTASONE) 10 MG tablet Take 4 tabs daily x 2 days, 3 tabs daily x 2 days, 2 tabs daily x 2 days, 1 tab daily x 2 days then stop  20 tablet  0  . predniSONE (DELTASONE) 20 MG tablet Take 1 tablet (20 mg total) by mouth daily with breakfast.  5 tablet  0  . simvastatin (ZOCOR) 20 MG tablet Take 20 mg by mouth at bedtime.         No current facility-administered medications on file prior to visit.

## 2013-10-24 NOTE — Telephone Encounter (Signed)
Ok to give her prednisone 10 mg, # 20, 4 X 2 DAYS, 3 X 2 DAYS, 2 X 2 DAYS, 1 X 2 DAYS

## 2013-10-24 NOTE — Telephone Encounter (Signed)
Attempted to call pt but no machine and no answer.  Will try back later 

## 2013-10-27 NOTE — Telephone Encounter (Signed)
Spoke with [facility]ist at [facility] and they verified that pt did pick up rx for pred taper last week.   Will close message.

## 2013-10-27 NOTE — Telephone Encounter (Signed)
ATC pt NA. I ATC pharmacy to see if pt picked up RX but pharmacy does not open until 9am. WCB. Carron CurieJennifer Castillo, CMA

## 2013-11-06 ENCOUNTER — Ambulatory Visit (INDEPENDENT_AMBULATORY_CARE_PROVIDER_SITE_OTHER): Payer: Medicare Other

## 2013-11-06 DIAGNOSIS — J45909 Unspecified asthma, uncomplicated: Secondary | ICD-10-CM

## 2013-11-07 MED ORDER — OMALIZUMAB 150 MG ~~LOC~~ SOLR
225.0000 mg | Freq: Once | SUBCUTANEOUS | Status: AC
  Administered 2013-11-07: 225 mg via SUBCUTANEOUS

## 2013-12-05 ENCOUNTER — Ambulatory Visit (INDEPENDENT_AMBULATORY_CARE_PROVIDER_SITE_OTHER): Payer: Medicare Other | Admitting: Internal Medicine

## 2013-12-05 ENCOUNTER — Ambulatory Visit: Payer: Medicare Other

## 2013-12-05 ENCOUNTER — Encounter: Payer: Self-pay | Admitting: Internal Medicine

## 2013-12-05 VITALS — BP 130/68 | HR 77 | Ht 60.0 in | Wt 148.0 lb

## 2013-12-05 DIAGNOSIS — R059 Cough, unspecified: Secondary | ICD-10-CM

## 2013-12-05 DIAGNOSIS — J45909 Unspecified asthma, uncomplicated: Secondary | ICD-10-CM

## 2013-12-05 DIAGNOSIS — R05 Cough: Secondary | ICD-10-CM

## 2013-12-05 DIAGNOSIS — J45998 Other asthma: Secondary | ICD-10-CM

## 2013-12-05 MED ORDER — METHYLPREDNISOLONE ACETATE 80 MG/ML IJ SUSP
80.0000 mg | Freq: Once | INTRAMUSCULAR | Status: AC
Start: 2013-12-05 — End: 2013-12-05
  Administered 2013-12-05: 80 mg via INTRAMUSCULAR

## 2013-12-05 MED ORDER — AZITHROMYCIN 250 MG PO TABS: ORAL_TABLET | ORAL | Status: DC

## 2013-12-05 MED ORDER — LEVALBUTEROL HCL 0.63 MG/3ML IN NEBU
0.6300 mg | INHALATION_SOLUTION | Freq: Once | RESPIRATORY_TRACT | Status: AC
  Administered 2013-12-05: 0.63 mg via RESPIRATORY_TRACT

## 2013-12-05 MED ORDER — ALBUTEROL SULFATE HFA 108 (90 BASE) MCG/ACT IN AERS: 2.0000 | INHALATION_SPRAY | RESPIRATORY_TRACT | Status: DC | PRN

## 2013-12-05 NOTE — Patient Instructions (Addendum)
Neb xop 0.63  Depo 80  Script printed for rescue inhaler  Script printed for Zpak to carry for trip. I don't expect it to cause itching, but if it does, you can stop the medicine and take benadryl for the itching.

## 2013-12-05 NOTE — Progress Notes (Signed)
Patient ID: Deborah Branch, female    DOB: 1935-01-06, 79 y.o.   MRN: [id number]  HPI 03/14/11- 79 year old female never smoker followed for chronic asthma, allergic rhinosinusitis complicated by GERD Last here March 14, 2010 Good year- still doing very well on Xolair shots monthly.  Continues Advair once daily but almost never needs to use her rescue inhaler. Continues flonase about every other day- I gave permission to try off it.  No recognized asthma in a very long time.   06/23/11- 79 year old female never smoker followed for chronic asthma, allergic rhinosinusitis complicated by GERD She has continued Xolair and it still works well for her. Has not needed Advair but still has prescription if she needs to restart. In the last 2 days she has started cough, dry or productive of yellow nonbloody sputum, some chills without fever, mild sore throat, no myalgias, no GI upset.  03/12/12- 79 year old female never smoker followed for chronic asthma(Xolair), allergic rhino-sinusitis complicated by GERD   PCP Dr Regino SchultzeMcGough Still on Xolair and no flare ups since viral infection last winter.. Minor sneezing if she sits outdoors. Off of maintenance medications, never needing rescue inhaler. We discussed long-term use of Xolair and studies that are looking at that now. She is reluctant to come off of Xolair which she associates with significantly improved quality of life.  07/03/12- 79 year old female never smoker followed for chronic asthma(Xolair), allergic rhino-sinusitis complicated by GERD   PCP Dr Regino SchultzeMcGough ACUTE VISIT: Increased wheezing and SOB, cough-productive-yellow, denies any chills/unsure of fevers. 2-3 days ago onset sore throat, cough yellow sputum, chest tight, wheeze. No fever, myalgias, GI. We sent Zpak. Her nebulizer is modest help only.  03/12/13- 79 year old female never smoker followed for chronic asthma(Xolair), allergic rhino-sinusitis complicated by GERD   PCP Dr Regino SchultzeMcGough FOLLOWS  FOR: recently had flare up and used Prednisone; continues to take Xolair and no troubles. Flare of asthma several weeks ago, reason unclear. Responded well to prednisone which is the first time she has needed it in a long time. Occasional Zyrtec for rhinitis.  12/05/13-79 year old female never smoker followed for chronic asthma(Xolair), allergic rhino-sinusitis complicated by GERD   PCP Dr Regino SchultzeMcGough FOLLOWS FOR: Pt c/o sore throat and dry cough x 3 days. C/o chest tightness with and without activity.  Going out of town next week and concerned about acute illness. Denies fever chills or purulent sputum. CXR 07/13/12 IMPRESSION:  Hyperinflation and may indicate COPD. No acute abnormality.  Original Report Authenticated By: Janeece Riggersavid Clark, M.D.  Review of Systems- see HPI Constitutional:   No-   weight loss, night sweats, fevers, chills, fatigue, lassitude. HEENT:   No-  headaches, difficulty swallowing, tooth/dental problems, +sore throat,       No-  sneezing, itching, ear ache, +nasal congestion, post nasal drip,  CV:  No-   chest pain, orthopnea, PND, swelling in lower extremities, anasarca, dizziness, palpitations Resp: no-shortness of breath with exertion or at rest.              No-  productive cough,  + non-productive cough,  No-  coughing up of blood.            No- change in color of mucus.  No-wheezing.   Skin: No-   rash or lesions. GI:  No-   heartburn, indigestion, abdominal pain, nausea, vomiting,  GU: MS:  No-   joint pain or swelling. Neuro- grossly normal  Psych:  No- change in mood or affect. No depression or anxiety.  No memory loss.     Objective:   Physical Exam General- Alert, Oriented, Affect-appropriate, Distress- none acute  Medium build Skin- rash-none, lesions- none, excoriation- none Lymphadenopathy- none Head- atraumatic            Eyes- Gross vision intact, PERRLA, conjunctivae clear secretions            Ears- Hearing, canals normal            Nose- + minor  sniffing, no-Septal dev, mucus, polyps, erosion, perforation             Throat- Mallampati III-IV , mucosa clear- not red , drainage- none, tonsils- atrophic Neck- flexible , trachea midline, no stridor , thyroid nl, carotid no bruit Chest - symmetrical excursion , unlabored           Heart/CV- RRR , no murmur , no gallop  , no rub, nl s1 s2                           - JVD- none , edema- none, stasis changes- none, varices- none           Lung-  Clear, wheeze-none , dullness-none, rub- none, cough + dry           Chest wall-  Abd-  Br/ Gen/ Rectal- Not done, not indicated Extrem- cyanosis- none, clubbing, none, atrophy- none, strength- nl Neuro- grossly intact to observation

## 2013-12-09 MED ORDER — OMALIZUMAB 150 MG ~~LOC~~ SOLR
225.0000 mg | Freq: Once | SUBCUTANEOUS | Status: AC
  Administered 2013-12-09: 225 mg via SUBCUTANEOUS

## 2014-01-05 ENCOUNTER — Ambulatory Visit (INDEPENDENT_AMBULATORY_CARE_PROVIDER_SITE_OTHER): Payer: Medicare Other

## 2014-01-05 DIAGNOSIS — J45998 Other asthma: Secondary | ICD-10-CM

## 2014-01-05 DIAGNOSIS — J45909 Unspecified asthma, uncomplicated: Secondary | ICD-10-CM

## 2014-01-05 MED ORDER — OMALIZUMAB 150 MG ~~LOC~~ SOLR
225.0000 mg | Freq: Once | SUBCUTANEOUS | Status: AC
  Administered 2014-01-05: 225 mg via SUBCUTANEOUS

## 2014-02-04 ENCOUNTER — Ambulatory Visit (INDEPENDENT_AMBULATORY_CARE_PROVIDER_SITE_OTHER): Payer: Medicare Other

## 2014-02-04 DIAGNOSIS — J45998 Other asthma: Secondary | ICD-10-CM

## 2014-02-04 DIAGNOSIS — J45909 Unspecified asthma, uncomplicated: Secondary | ICD-10-CM

## 2014-02-04 MED ORDER — OMALIZUMAB 150 MG ~~LOC~~ SOLR
225.0000 mg | Freq: Once | SUBCUTANEOUS | Status: AC
  Administered 2014-02-04: 225 mg via SUBCUTANEOUS

## 2014-02-08 NOTE — Assessment & Plan Note (Signed)
Acute exacerbation Plan-nebulizer treatment Xopenex, Depo-Medrol, refill rescue inhaler, Z-Pak to hold

## 2014-03-05 ENCOUNTER — Telehealth (HOSPITAL_COMMUNITY): Payer: Self-pay | Admitting: Dietician

## 2014-03-05 ENCOUNTER — Ambulatory Visit (INDEPENDENT_AMBULATORY_CARE_PROVIDER_SITE_OTHER): Payer: Medicare Other

## 2014-03-05 DIAGNOSIS — J45998 Other asthma: Secondary | ICD-10-CM

## 2014-03-05 DIAGNOSIS — J45909 Unspecified asthma, uncomplicated: Secondary | ICD-10-CM

## 2014-03-05 NOTE — Telephone Encounter (Signed)
Received call from Ms. Antonellis inquiring about DM classes on 03/04/14 around 1430. She reports that her husband was recently diagnosed with diabetes and is seeking out community resources. She reports that she and her husband have already registered for classes at [facility], but would like as much information as possible. Informed her that [facility] are currently going through a transitional period and offering as limited currently. Most recent DM class was cancelled due to low participation. Informed her of [facility] that will be opening up at the end of this month as a resource. Encouraged her to continue to look for new class schedule and offerings on [employer], as this Clinical research associate is unsure about the new class schedule for October and beyond at this time. She is agreeable to having literature sent to her home in the interim, until a class is available. Sent "1900 Calorie Meal Plan", "Dining Out with Diabetes", "Type 2 Diabetes", and "Diabetes and Prediabetes" handouts from [facility] as well as "Plate Method" handout from [facility]. She was very grateful for information given.

## 2014-03-12 ENCOUNTER — Encounter: Payer: Self-pay | Admitting: Internal Medicine

## 2014-03-12 ENCOUNTER — Ambulatory Visit (INDEPENDENT_AMBULATORY_CARE_PROVIDER_SITE_OTHER): Payer: Medicare Other | Admitting: Internal Medicine

## 2014-03-12 VITALS — BP 158/70 | HR 73 | Ht 60.0 in | Wt 148.0 lb

## 2014-03-12 DIAGNOSIS — Z23 Encounter for immunization: Secondary | ICD-10-CM

## 2014-03-12 MED ORDER — OMALIZUMAB 150 MG ~~LOC~~ SOLR
225.0000 mg | Freq: Once | SUBCUTANEOUS | Status: AC
  Administered 2014-03-12: 225 mg via SUBCUTANEOUS

## 2014-03-12 NOTE — Patient Instructions (Signed)
We can continue Xolair  Flu vax

## 2014-03-12 NOTE — Progress Notes (Signed)
Patient ID: Deborah Branch, female    DOB: 04-23-1935, 79 y.o.   MRN: [id number]  HPI 03/14/11- 80 year old female never smoker followed for chronic asthma, allergic rhinosinusitis complicated by GERD Last here March 14, 2010 Good year- still doing very well on Xolair shots monthly.  Continues Advair once daily but almost never needs to use her rescue inhaler. Continues flonase about every other day- I gave permission to try off it.  No recognized asthma in a very long time.   06/23/11- 79 year old female never smoker followed for chronic asthma, allergic rhinosinusitis complicated by GERD She has continued Xolair and it still works well for her. Has not needed Advair but still has prescription if she needs to restart. In the last 2 days she has started cough, dry or productive of yellow nonbloody sputum, some chills without fever, mild sore throat, no myalgias, no GI upset.  03/12/12- 79 year old female never smoker followed for chronic asthma(Xolair), allergic rhino-sinusitis complicated by GERD   PCP Dr Regino Schultze Still on Xolair and no flare ups since viral infection last winter.. Minor sneezing if she sits outdoors. Off of maintenance medications, never needing rescue inhaler. We discussed long-term use of Xolair and studies that are looking at that now. She is reluctant to come off of Xolair which she associates with significantly improved quality of life.  07/03/12- 79 year old female never smoker followed for chronic asthma(Xolair), allergic rhino-sinusitis complicated by GERD   PCP Dr Regino Schultze ACUTE VISIT: Increased wheezing and SOB, cough-productive-yellow, denies any chills/unsure of fevers. 2-3 days ago onset sore throat, cough yellow sputum, chest tight, wheeze. No fever, myalgias, GI. We sent Zpak. Her nebulizer is modest help only.  03/12/13- 79 year old female never smoker followed for chronic asthma(Xolair), allergic rhino-sinusitis complicated by GERD   PCP Dr Regino Schultze FOLLOWS  FOR: recently had flare up and used Prednisone; continues to take Xolair and no troubles. Flare of asthma several weeks ago, reason unclear. Responded well to prednisone which is the first time she has needed it in a long time. Occasional Zyrtec for rhinitis.  12/05/13-79 year old female never smoker followed for chronic asthma(Xolair), allergic rhino-sinusitis complicated by GERD   PCP Dr Regino Schultze FOLLOWS FOR: Pt c/o sore throat and dry cough x 3 days. C/o chest tightness with and without activity.  Going out of town next week and concerned about acute illness. Denies fever chills or purulent sputum. CXR 07/13/12 IMPRESSION:  Hyperinflation and may indicate COPD. No acute abnormality.  Original Report Authenticated By: Janeece Riggers, M.D.  03/12/14- 79 year old female never smoker followed for chronic asthma(Xolair), allergic rhino-sinusitis complicated by GERD   PCP Dr Regino Schultze FOLLOWS FOR: Pt denies SOB, cough and CP. Pt states she is doing very well with the Xolair. Pt states overall she is doing very well.    Review of Systems- see HPI Constitutional:   No-   weight loss, night sweats, fevers, chills, fatigue, lassitude. HEENT:   No-  headaches, difficulty swallowing, tooth/dental problems, +sore throat,       No-  sneezing, itching, ear ache, +nasal congestion, post nasal drip,  CV:  No-   chest pain, orthopnea, PND, swelling in lower extremities, anasarca, dizziness, palpitations Resp: no-shortness of breath with exertion or at rest.              No-  productive cough,  + non-productive cough,  No-  coughing up of blood.            No- change in color of mucus.  No-wheezing.  Skin: No-   rash or lesions. GI:  No-   heartburn, indigestion, abdominal pain, nausea, vomiting,  GU: MS:  No-   joint pain or swelling. Neuro- grossly normal  Psych:  No- change in mood or affect. No depression or anxiety.  No memory loss.     Objective:   Physical Exam General- Alert, Oriented,  Affect-appropriate, Distress- none acute  Medium build Skin- rash-none, lesions- none, excoriation- none Lymphadenopathy- none Head- atraumatic            Eyes- Gross vision intact, PERRLA, conjunctivae clear secretions            Ears- Hearing, canals normal            Nose- + minor sniffing, no-Septal dev, mucus, polyps, erosion, perforation             Throat- Mallampati III-IV , mucosa clear- not red , drainage- none, tonsils- atrophic Neck- flexible , trachea midline, no stridor , thyroid nl, carotid no bruit Chest - symmetrical excursion , unlabored           Heart/CV- RRR , no murmur , no gallop  , no rub, nl s1 s2                           - JVD- none , edema- none, stasis changes- none, varices- none           Lung-  Clear, wheeze-none , dullness-none, rub- none, cough + dry           Chest wall-  Abd-  Br/ Gen/ Rectal- Not done, not indicated Extrem- cyanosis- none, clubbing, none, atrophy- none, strength- nl Neuro- grossly intact to observation

## 2014-04-03 ENCOUNTER — Ambulatory Visit (INDEPENDENT_AMBULATORY_CARE_PROVIDER_SITE_OTHER): Payer: Medicare Other

## 2014-04-03 DIAGNOSIS — J45998 Other asthma: Secondary | ICD-10-CM

## 2014-04-15 MED ORDER — OMALIZUMAB 150 MG ~~LOC~~ SOLR
225.0000 mg | Freq: Once | SUBCUTANEOUS | Status: AC
  Administered 2014-04-15: 225 mg via SUBCUTANEOUS

## 2014-04-23 ENCOUNTER — Telehealth: Payer: Self-pay | Admitting: Internal Medicine

## 2014-04-23 MED ORDER — PREDNISONE 10 MG PO TABS: ORAL_TABLET | ORAL | Status: DC

## 2014-04-23 NOTE — Telephone Encounter (Signed)
lmomtcb x1 

## 2014-04-23 NOTE — Telephone Encounter (Signed)
Pt returned call. Informed pt of recs per CY. Rx sent to preferred pharmacy. Pt verbalized understanding and denied any further questions or concerns at this time.

## 2014-04-23 NOTE — Telephone Encounter (Signed)
Called, spoke with pt. C/o SOB and chest tightness x 4-5 days.  Has a dry cough and wheezing as well.  No f/c/s.  Using albuterol neb at least bid.  Helps symptoms short term but has not completely eliminated them.  Pt currently out of town and is requesting recs and rxs.  Dr .Maple HudsonYoung, pls advise.  Thank you.  Note:  Albuterol neb is not on pt's current med list.  Pt does report she has plenty of this medication  [facility]  Last OV with CY: 03/12/14; f/u in 1 yr  Allergies verified: Allergies  Allergen Reactions  . Clarithromycin     REACTION: itching  . Penicillins     REACTION: hives     Current Outpatient Prescriptions on File Prior to Visit  Medication Sig Dispense Refill  . albuterol (PROAIR HFA) 108 (90 BASE) MCG/ACT inhaler Inhale 2 puffs into the lungs every 4 (four) hours as needed for wheezing or shortness of breath.  1 Inhaler  prn  . aspirin 81 MG tablet Take 81 mg by mouth daily.        Marland Kitchen. azithromycin (ZITHROMAX) 250 MG tablet 2 today then one daily  6 tablet  0  . Calcium Carbonate (CALTRATE 600) 1500 MG TABS Take 1 tablet by mouth daily.        . cetirizine (ZYRTEC) 10 MG tablet Take 10 mg by mouth daily as needed.       . Cholecalciferol (VITAMIN D) 2000 UNITS CAPS Take 1 capsule by mouth daily.        Marland Kitchen. diltiazem (CARDIZEM CD) 180 MG 24 hr capsule Take 180 mg by mouth daily.        . fluticasone (FLONASE) 50 MCG/ACT nasal spray Place 1-2 sprays into both nostrils daily as needed.      . Multiple Vitamin (MULTIVITAMIN) tablet Take 1 tablet by mouth daily.        Marland Kitchen. omalizumab (XOLAIR) 150 MG injection Inject 225 mg into the skin every 30 (thirty) days.        . Omega-3 Fatty Acids (FISH OIL) 1200 MG CAPS Take 1 capsule by mouth daily.        Marland Kitchen. omeprazole (PRILOSEC) 20 MG capsule Take 20 mg by mouth daily.        . simvastatin (ZOCOR) 20 MG tablet Take 20 mg by mouth at bedtime.         No current facility-administered medications on file prior to visit.

## 2014-04-23 NOTE — Telephone Encounter (Signed)
Without obvious infection, probably prednisone would work best. Plan- prednisone 10 mg, # 20, 4 X 2 DAYS, 3 X 2 DAYS, 2 X 2 DAYS, 1 X 2 DAYS

## 2014-05-01 ENCOUNTER — Ambulatory Visit: Payer: Medicare Other

## 2014-05-04 ENCOUNTER — Ambulatory Visit (INDEPENDENT_AMBULATORY_CARE_PROVIDER_SITE_OTHER): Payer: Medicare Other

## 2014-05-04 DIAGNOSIS — J45998 Other asthma: Secondary | ICD-10-CM

## 2014-05-12 MED ORDER — OMALIZUMAB 150 MG ~~LOC~~ SOLR
225.0000 mg | Freq: Once | SUBCUTANEOUS | Status: AC
  Administered 2014-05-04: 225 mg via SUBCUTANEOUS

## 2014-05-18 ENCOUNTER — Encounter: Payer: Self-pay | Admitting: Adult Health

## 2014-05-18 ENCOUNTER — Ambulatory Visit (INDEPENDENT_AMBULATORY_CARE_PROVIDER_SITE_OTHER): Payer: Medicare Other | Admitting: Adult Health

## 2014-05-18 VITALS — BP 138/86 | HR 85 | Temp 97.0°F | Ht 60.0 in | Wt 152.4 lb

## 2014-05-18 DIAGNOSIS — J45998 Other asthma: Secondary | ICD-10-CM

## 2014-05-18 MED ORDER — AZITHROMYCIN 250 MG PO TABS: ORAL_TABLET | ORAL | Status: AC

## 2014-05-18 MED ORDER — PREDNISONE 10 MG PO TABS: ORAL_TABLET | ORAL | Status: DC

## 2014-05-18 MED ORDER — HYDROCODONE-ACETAMINOPHEN 5-325 MG PO TABS: 1.0000 | ORAL_TABLET | Freq: Four times a day (QID) | ORAL | Status: DC | PRN

## 2014-05-18 MED ORDER — HYDROCODONE-HOMATROPINE 5-1.5 MG/5ML PO SYRP: 2.5000 mL | ORAL_SOLUTION | Freq: Every evening | ORAL | Status: DC | PRN

## 2014-05-18 NOTE — Assessment & Plan Note (Signed)
Flare with URI   Plan  ZPack take as directed.  Mucinex DM Twice daily  As needed  Cough/congestion  Prednisone taper over next week Hydromet 1/2 tsp At bedtime  As needed cough , may make you sleepy.  Fluids and rest  Tyelnol As needed   Please contact office for sooner follow up if symptoms do not improve or worsen or seek emergency care  Follow up Dr. Maple HudsonYoung  As planned

## 2014-05-18 NOTE — Addendum Note (Signed)
Addended by: Julio SicksPARRETT, TAMMY S on: 05/18/2014 03:40 PM   Modules accepted: Orders

## 2014-05-18 NOTE — Progress Notes (Signed)
Patient ID: Deborah Branch, female    DOB: 04/24/1935, 79 y.o.   MRN: [id number]  HPI 03/14/11- 79 year old female never smoker followed for chronic asthma, allergic rhinosinusitis complicated by GERD Last here March 14, 2010 Good year- still doing very well on Xolair shots monthly.  Continues Advair once daily but almost never needs to use her rescue inhaler. Continues flonase about every other day- I gave permission to try off it.  No recognized asthma in a very long time.   06/23/11- 79 year old female never smoker followed for chronic asthma, allergic rhinosinusitis complicated by GERD She has continued Xolair and it still works well for her. Has not needed Advair but still has prescription if she needs to restart. In the last 2 days she has started cough, dry or productive of yellow nonbloody sputum, some chills without fever, mild sore throat, no myalgias, no GI upset.  03/12/12- 79 year old female never smoker followed for chronic asthma(Xolair), allergic rhino-sinusitis complicated by GERD   PCP Dr Regino SchultzeMcGough Still on Xolair and no flare ups since viral infection last winter.. Minor sneezing if she sits outdoors. Off of maintenance medications, never needing rescue inhaler. We discussed long-term use of Xolair and studies that are looking at that now. She is reluctant to come off of Xolair which she associates with significantly improved quality of life.  07/03/12- 79 year old female never smoker followed for chronic asthma(Xolair), allergic rhino-sinusitis complicated by GERD   PCP Dr Regino SchultzeMcGough ACUTE VISIT: Increased wheezing and SOB, cough-productive-yellow, denies any chills/unsure of fevers. 2-3 days ago onset sore throat, cough yellow sputum, chest tight, wheeze. No fever, myalgias, GI. We sent Zpak. Her nebulizer is modest help only.  03/12/13- 79 year old female never smoker followed for chronic asthma(Xolair), allergic rhino-sinusitis complicated by GERD   PCP Dr Regino SchultzeMcGough FOLLOWS  FOR: recently had flare up and used Prednisone; continues to take Xolair and no troubles. Flare of asthma several weeks ago, reason unclear. Responded well to prednisone which is the first time she has needed it in a long time. Occasional Zyrtec for rhinitis.  12/05/13-79 year old female never smoker followed for chronic asthma(Xolair), allergic rhino-sinusitis complicated by GERD   PCP Dr Regino SchultzeMcGough FOLLOWS FOR: Pt c/o sore throat and dry cough x 3 days. C/o chest tightness with and without activity.  Going out of town next week and concerned about acute illness. Denies fever chills or purulent sputum. CXR 07/13/12 IMPRESSION:  Hyperinflation and may indicate COPD. No acute abnormality.  Original Report Authenticated By: Janeece Riggersavid Clark, M.D.  03/12/14- 79 year old female never smoker followed for chronic asthma(Xolair), allergic rhino-sinusitis complicated by GERD   PCP Dr Regino SchultzeMcGough FOLLOWS FOR: Pt denies SOB, cough and CP. Pt states she is doing very well with the Xolair. Pt states overall she is doing very well.   05/18/2014 Acute OV  79 year old female never smoker followed for chronic asthma(Xolair), allergic rhino-sinusitis complicated by GERD   PCP Dr June LeapMcGough Presents for an acute office visit  Complains of productive cough with yellow-to-clear mucus, increased SOB, wheezing, tightness, PND x4days.   Denies f/c/s, n/v/d, hemoptysis, chest pain, or orthopnea No recent abx use. Feels working with [employer] triggered her symptoms.    Review of Systems- see HPI Constitutional:   No-   weight loss, night sweats, fevers, chills, fatigue, lassitude. HEENT:   No-  headaches, difficulty swallowing, tooth/dental problems, +sore throat,       No-  sneezing, itching, ear ache, +nasal congestion, post nasal drip,  CV:  No-   chest  pain, orthopnea, PND, swelling in lower extremities, anasarca, dizziness, palpitations Resp: no-shortness of breath with exertion or at rest.              No-   productive cough,  + non-productive cough,  No-  coughing up of blood.            +change in color of mucus. +-wheezing.   Skin: No-   rash or lesions. GI:  No-   heartburn, indigestion, abdominal pain, nausea, vomiting,  GU: MS:  No-   joint pain or swelling. Neuro- grossly normal  Psych:  No- change in mood or affect. No depression or anxiety.  No memory loss.     Objective:   Physical Exam General- Alert, Oriented, Affect-appropriate, Distress- none acute  Medium build Skin- rash-none, lesions- none, excoriation- none Lymphadenopathy- none Head- atraumatic            Eyes- Gross vision intact, PERRLA, conjunctivae clear secretions            Ears- Hearing, canals normal            Nose- + minor sniffing, no-Septal dev, mucus, polyps, erosion, perforation             Throat- Mallampati III-IV , mucosa clear- not red , drainage- none, tonsils- atrophic Neck- flexible , trachea midline, no stridor , thyroid nl, carotid no bruit Chest - symmetrical excursion , unlabored           Heart/CV- RRR , no murmur , no gallop  , no rub, nl s1 s2                           - JVD- none , edema- none, stasis changes- none, varices- none           Lung- exp wheezes  Abd- soft  Br/ Gen/ Rectal- Not done, not indicated Extrem- cyanosis- none, clubbing, none, atrophy- none, strength- nl Neuro- grossly intact to observation

## 2014-05-18 NOTE — Patient Instructions (Addendum)
ZPack take as directed.  Mucinex DM Twice daily  As needed  Cough/congestion  Prednisone taper over next week Hydromet 1/2 tsp At bedtime  As needed cough , may make you sleepy.  Fluids and rest  Tyelnol As needed   Please contact office for sooner follow up if symptoms do not improve or worsen or seek emergency care  Follow up Dr. Maple HudsonYoung  As planned

## 2014-05-18 NOTE — Addendum Note (Signed)
Addended by: Boone MasterJONES, JESSICA E on: 05/18/2014 03:45 PM   Modules accepted: Orders, Medications

## 2014-05-29 ENCOUNTER — Telehealth: Payer: Self-pay | Admitting: Adult Health

## 2014-05-29 MED ORDER — PREDNISONE 10 MG PO TABS: 10.0000 mg | ORAL_TABLET | Freq: Every day | ORAL | Status: DC

## 2014-05-29 NOTE — Telephone Encounter (Signed)
Spoke with pt husband. Aware of rec's per CY. Will relay to patient. Nothing further needed.

## 2014-05-29 NOTE — Telephone Encounter (Signed)
Spoke with the pt She was seen here by TP on 05/18/14 for acute visit- AVS follows   ZPack take as directed.  Mucinex DM Twice daily As needed Cough/congestion  Prednisone taper over next week Hydromet 1/2 tsp At bedtime As needed cough , may make you sleepy.  Fluids and rest  Tyelnol As needed  Please contact office for sooner follow up if symptoms do not improve or worsen or seek emergency care  Follow up Dr. Maple HudsonYoung As planned   Pt has completed pred taper and abx and "feels some better" Wheezing, SOB have pretty much resolved She is coughing slightly less, but still bothered by cough despite taking mucinex and hydromet  Cough is prod with thick, clear sputum  No f/c/s or other co's   CDY please advise, thanks! Allergies  Allergen Reactions  . Clarithromycin     REACTION: itching  . Penicillins     REACTION: hives   Current Outpatient Prescriptions on File Prior to Visit  Medication Sig Dispense Refill  . albuterol (PROAIR HFA) 108 (90 BASE) MCG/ACT inhaler Inhale 2 puffs into the lungs every 4 (four) hours as needed for wheezing or shortness of breath. 1 Inhaler prn  . aspirin 81 MG tablet Take 81 mg by mouth daily.      . Calcium Carbonate (CALTRATE 600) 1500 MG TABS Take 1 tablet by mouth daily.      . cetirizine (ZYRTEC) 10 MG tablet Take 10 mg by mouth daily as needed.     . Cholecalciferol (VITAMIN D) 2000 UNITS CAPS Take 1 capsule by mouth daily.      Marland Kitchen. diltiazem (CARDIZEM CD) 180 MG 24 hr capsule Take 180 mg by mouth daily.      . fluticasone (FLONASE) 50 MCG/ACT nasal spray Place 1-2 sprays into both nostrils daily as needed.    Marland Kitchen. HYDROcodone-homatropine (HYDROMET) 5-1.5 MG/5ML syrup Take 2.5 mLs by mouth at bedtime as needed. 120 mL 0  . Multiple Vitamin (MULTIVITAMIN) tablet Take 1 tablet by mouth daily.      . Omega-3 Fatty Acids (FISH OIL) 1200 MG CAPS Take 1 capsule by mouth daily.      Marland Kitchen. omeprazole (PRILOSEC) 20 MG capsule Take 20 mg by mouth daily.       . predniSONE (DELTASONE) 10 MG tablet 4 tabs for 2 days, then 3 tabs for 2 days, 2 tabs for 2 days, then 1 tab for 2 days, then stop 20 tablet 0  . simvastatin (ZOCOR) 20 MG tablet Take 20 mg by mouth at bedtime.       No current facility-administered medications on file prior to visit.

## 2014-05-29 NOTE — Telephone Encounter (Signed)
Offer prednisone 10 mg, # 7, 1 daily, to extend course Continue Mucinex- dM Ok to use the cough syrup if needed

## 2014-06-03 ENCOUNTER — Ambulatory Visit (INDEPENDENT_AMBULATORY_CARE_PROVIDER_SITE_OTHER): Payer: Medicare Other

## 2014-06-03 DIAGNOSIS — J45998 Other asthma: Secondary | ICD-10-CM

## 2014-06-04 MED ORDER — OMALIZUMAB 150 MG ~~LOC~~ SOLR
225.0000 mg | Freq: Once | SUBCUTANEOUS | Status: AC
  Administered 2014-06-03: 225 mg via SUBCUTANEOUS

## 2014-07-02 ENCOUNTER — Ambulatory Visit (INDEPENDENT_AMBULATORY_CARE_PROVIDER_SITE_OTHER): Payer: Medicare Other

## 2014-07-02 DIAGNOSIS — J45998 Other asthma: Secondary | ICD-10-CM

## 2014-07-02 MED ORDER — OMALIZUMAB 150 MG ~~LOC~~ SOLR
225.0000 mg | Freq: Once | SUBCUTANEOUS | Status: AC
  Administered 2014-07-02: 225 mg via SUBCUTANEOUS

## 2014-07-31 ENCOUNTER — Ambulatory Visit (INDEPENDENT_AMBULATORY_CARE_PROVIDER_SITE_OTHER): Payer: Medicare Other

## 2014-07-31 DIAGNOSIS — J45998 Other asthma: Secondary | ICD-10-CM

## 2014-07-31 MED ORDER — OMALIZUMAB 150 MG ~~LOC~~ SOLR
225.0000 mg | Freq: Once | SUBCUTANEOUS | Status: AC
  Administered 2014-07-31: 225 mg via SUBCUTANEOUS

## 2014-08-31 ENCOUNTER — Ambulatory Visit: Payer: Medicare Other

## 2014-08-31 ENCOUNTER — Telehealth: Payer: Self-pay | Admitting: Internal Medicine

## 2014-08-31 MED ORDER — PREDNISONE 10 MG PO TABS
ORAL_TABLET | ORAL | Status: DC
Start: 2014-08-31 — End: 2014-10-29

## 2014-08-31 NOTE — Telephone Encounter (Signed)
Per CY-patient [facility] Prednisone 10 mg #20 take 4 x days, 3 x 2 days, 2 x 2 days, 1 x 2 days, then stop no refills. Pt came in the office for her Xolair and was told of the above. [facility] and nothing more needed at this time.

## 2014-08-31 NOTE — Telephone Encounter (Signed)
Spoke with patient; she wanted to see if CY would call her something to the pharmacy as she is having increased wheezing, chest congestion, and slight cough(non productive) x 4-5 days; has used neb tx and Zyrtec with no relief.   Allergies  Allergen Reactions  . Clarithromycin     REACTION: itching  . Penicillins     REACTION: hives   Pharmacy : [facility] (on file).    CY Please advise. Thanks.

## 2014-09-21 ENCOUNTER — Ambulatory Visit: Payer: Medicare Other

## 2014-09-21 ENCOUNTER — Ambulatory Visit (INDEPENDENT_AMBULATORY_CARE_PROVIDER_SITE_OTHER): Payer: Medicare Other

## 2014-09-21 DIAGNOSIS — J4540 Moderate persistent asthma, uncomplicated: Secondary | ICD-10-CM | POA: Diagnosis not present

## 2014-09-21 MED ORDER — OMALIZUMAB 150 MG ~~LOC~~ SOLR
225.0000 mg | Freq: Once | SUBCUTANEOUS | Status: AC
  Administered 2014-09-21: 225 mg via SUBCUTANEOUS

## 2014-09-22 ENCOUNTER — Ambulatory Visit: Payer: Medicare Other

## 2014-09-30 ENCOUNTER — Ambulatory Visit: Payer: Medicare Other

## 2014-10-20 ENCOUNTER — Ambulatory Visit: Payer: Medicare Other

## 2014-10-21 ENCOUNTER — Ambulatory Visit (INDEPENDENT_AMBULATORY_CARE_PROVIDER_SITE_OTHER): Payer: Medicare Other

## 2014-10-21 DIAGNOSIS — J4540 Moderate persistent asthma, uncomplicated: Secondary | ICD-10-CM

## 2014-10-23 MED ORDER — OMALIZUMAB 150 MG ~~LOC~~ SOLR
225.0000 mg | Freq: Once | SUBCUTANEOUS | Status: AC
  Administered 2014-10-21: 225 mg via SUBCUTANEOUS

## 2014-10-29 ENCOUNTER — Telehealth: Payer: Self-pay | Admitting: Internal Medicine

## 2014-10-29 MED ORDER — PREDNISONE 10 MG PO TABS: ORAL_TABLET | ORAL | Status: DC

## 2014-10-29 NOTE — Telephone Encounter (Signed)
Called pt. She c/o lots of wheezing, chest tx, increase SOB w/ exertion x couple days. No cough, no PND, no congestion. She is using her nebs/rescue inhaler. Please advise CDY thanks  Allergies  Allergen Reactions  . Clarithromycin     REACTION: itching  . Penicillins     REACTION: hives    Current Outpatient Prescriptions on File Prior to Visit  Medication Sig Dispense Refill  . albuterol (PROAIR HFA) 108 (90 BASE) MCG/ACT inhaler Inhale 2 puffs into the lungs every 4 (four) hours as needed for wheezing or shortness of breath. 1 Inhaler prn  . aspirin 81 MG tablet Take 81 mg by mouth daily.      . Calcium Carbonate (CALTRATE 600) 1500 MG TABS Take 1 tablet by mouth daily.      . cetirizine (ZYRTEC) 10 MG tablet Take 10 mg by mouth daily as needed.     . Cholecalciferol (VITAMIN D) 2000 UNITS CAPS Take 1 capsule by mouth daily.      Marland Kitchen. diltiazem (CARDIZEM CD) 180 MG 24 hr capsule Take 180 mg by mouth daily.      . fluticasone (FLONASE) 50 MCG/ACT nasal spray Place 1-2 sprays into both nostrils daily as needed.    . Multiple Vitamin (MULTIVITAMIN) tablet Take 1 tablet by mouth daily.      . Omega-3 Fatty Acids (FISH OIL) 1200 MG CAPS Take 1 capsule by mouth daily.      Marland Kitchen. omeprazole (PRILOSEC) 20 MG capsule Take 20 mg by mouth daily.      . predniSONE (DELTASONE) 10 MG tablet Take 1 tablet (10 mg total) by mouth daily with breakfast. In addition to current Prednisone 7 tablet 0  . simvastatin (ZOCOR) 20 MG tablet Take 20 mg by mouth at bedtime.       No current facility-administered medications on file prior to visit.

## 2014-10-29 NOTE — Telephone Encounter (Signed)
Offer prednisone 10 mg. # 20, 4 X 2 DAYS, 3 X 2 DAYS, 2 X 2 DAYS, 1 X 2 DAYS

## 2014-10-29 NOTE — Telephone Encounter (Signed)
RX has been sent in. Nothing further needed and pt aware

## 2014-11-02 ENCOUNTER — Telehealth: Payer: Self-pay | Admitting: Internal Medicine

## 2014-11-05 NOTE — Telephone Encounter (Signed)
#   vials:2 Ordered date:11/02/14 Shipping Date:11/03/14   # Vials:2 Arrival Date:11/03/14 Lot [id number] Exp Date:12/19

## 2014-11-19 ENCOUNTER — Ambulatory Visit (INDEPENDENT_AMBULATORY_CARE_PROVIDER_SITE_OTHER): Payer: Medicare Other

## 2014-11-19 DIAGNOSIS — J4540 Moderate persistent asthma, uncomplicated: Secondary | ICD-10-CM | POA: Diagnosis not present

## 2014-11-19 MED ORDER — OMALIZUMAB 150 MG ~~LOC~~ SOLR
225.0000 mg | Freq: Once | SUBCUTANEOUS | Status: AC
  Administered 2014-11-19: 225 mg via SUBCUTANEOUS

## 2014-11-27 ENCOUNTER — Telehealth: Payer: Self-pay | Admitting: Internal Medicine

## 2014-12-01 NOTE — Telephone Encounter (Signed)
#   vials:2 Ordered date:11/27/14 Shipping Date:11/27/14    # Vials:2 Arrival Date:12/01/14 Lot [id number] Exp Date:1/20

## 2014-12-18 ENCOUNTER — Ambulatory Visit (INDEPENDENT_AMBULATORY_CARE_PROVIDER_SITE_OTHER): Payer: Medicare Other

## 2014-12-18 DIAGNOSIS — J4540 Moderate persistent asthma, uncomplicated: Secondary | ICD-10-CM | POA: Diagnosis not present

## 2014-12-18 MED ORDER — OMALIZUMAB 150 MG ~~LOC~~ SOLR
225.0000 mg | Freq: Once | SUBCUTANEOUS | Status: AC
  Administered 2014-12-18: 225 mg via SUBCUTANEOUS

## 2014-12-21 ENCOUNTER — Telehealth: Payer: Self-pay | Admitting: Internal Medicine

## 2014-12-21 NOTE — Telephone Encounter (Signed)
#   vials:2 Ordered date:12/21/14  Shipping Date:12/22/14

## 2014-12-22 NOTE — Telephone Encounter (Signed)
#   Vials:2 Arrival Date:12/22/2014 Lot #:[id number] Exp Date:1/20

## 2014-12-25 ENCOUNTER — Encounter: Payer: Self-pay | Admitting: Gastroenterology

## 2015-01-06 ENCOUNTER — Other Ambulatory Visit (HOSPITAL_COMMUNITY): Payer: Self-pay | Admitting: Family Medicine

## 2015-01-06 DIAGNOSIS — Z1231 Encounter for screening mammogram for malignant neoplasm of breast: Secondary | ICD-10-CM

## 2015-01-15 ENCOUNTER — Ambulatory Visit (INDEPENDENT_AMBULATORY_CARE_PROVIDER_SITE_OTHER): Payer: Medicare Other

## 2015-01-15 DIAGNOSIS — J4540 Moderate persistent asthma, uncomplicated: Secondary | ICD-10-CM | POA: Diagnosis not present

## 2015-01-18 MED ORDER — OMALIZUMAB 150 MG ~~LOC~~ SOLR
225.0000 mg | Freq: Once | SUBCUTANEOUS | Status: AC
  Administered 2015-01-15: 225 mg via SUBCUTANEOUS

## 2015-01-21 ENCOUNTER — Other Ambulatory Visit (HOSPITAL_COMMUNITY): Payer: Self-pay | Admitting: Family Medicine

## 2015-01-21 DIAGNOSIS — M8580 Other specified disorders of bone density and structure, unspecified site: Secondary | ICD-10-CM

## 2015-01-22 ENCOUNTER — Ambulatory Visit (HOSPITAL_COMMUNITY)
Admission: RE | Admit: 2015-01-22 | Discharge: 2015-01-22 | Disposition: A | Discharge status: Home / Self Care | Payer: Medicare Other | Source: Ambulatory Visit | Attending: Family Medicine | Admitting: Family Medicine

## 2015-01-22 DIAGNOSIS — M8580 Other specified disorders of bone density and structure, unspecified site: Secondary | ICD-10-CM | POA: Insufficient documentation

## 2015-01-22 DIAGNOSIS — Z1231 Encounter for screening mammogram for malignant neoplasm of breast: Secondary | ICD-10-CM | POA: Insufficient documentation

## 2015-01-22 DIAGNOSIS — Z780 Asymptomatic menopausal state: Secondary | ICD-10-CM | POA: Diagnosis not present

## 2015-01-22 DIAGNOSIS — R29890 Loss of height: Secondary | ICD-10-CM | POA: Insufficient documentation

## 2015-01-22 DIAGNOSIS — M810 Age-related osteoporosis without current pathological fracture: Secondary | ICD-10-CM | POA: Diagnosis not present

## 2015-02-02 ENCOUNTER — Telehealth: Payer: Self-pay | Admitting: Internal Medicine

## 2015-02-02 NOTE — Telephone Encounter (Signed)
#   vials:2 Ordered date:02/02/15 Shipping Date:02/03/15

## 2015-02-03 NOTE — Telephone Encounter (Signed)
#   Vials:2 Arrival Date:02/03/15 Lot #:[id number] Exp Date:2/20

## 2015-02-03 NOTE — Telephone Encounter (Signed)
Shipment had 2 different lot #s.  [id number] is the correct one.

## 2015-02-08 ENCOUNTER — Telehealth: Payer: Self-pay | Admitting: Internal Medicine

## 2015-02-08 MED ORDER — PREDNISONE 10 MG PO TABS: ORAL_TABLET | ORAL | Status: DC

## 2015-02-08 NOTE — Telephone Encounter (Signed)
Patient returned call and can be reached at [phone].

## 2015-02-08 NOTE — Telephone Encounter (Signed)
Spoke with pt. She c/o chest tx, increase SOB at all times x couple weeks. She knows it is due to the heat and trying to stay in as much as possible. She wants something called in. She is using her neb meds and rescue inhaler PRN.  Please advise Dr. Maple Hudson thanks  Allergies  Allergen Reactions  . Clarithromycin     REACTION: itching  . Penicillins     REACTION: hives    Current Outpatient Prescriptions on File Prior to Visit  Medication Sig Dispense Refill  . albuterol (PROAIR HFA) 108 (90 BASE) MCG/ACT inhaler Inhale 2 puffs into the lungs every 4 (four) hours as needed for wheezing or shortness of breath. 1 Inhaler prn  . aspirin 81 MG tablet Take 81 mg by mouth daily.      . Calcium Carbonate (CALTRATE 600) 1500 MG TABS Take 1 tablet by mouth daily.      . cetirizine (ZYRTEC) 10 MG tablet Take 10 mg by mouth daily as needed.     . Cholecalciferol (VITAMIN D) 2000 UNITS CAPS Take 1 capsule by mouth daily.      Marland Kitchen diltiazem (CARDIZEM CD) 180 MG 24 hr capsule Take 180 mg by mouth daily.      . fluticasone (FLONASE) 50 MCG/ACT nasal spray Place 1-2 sprays into both nostrils daily as needed.    . Multiple Vitamin (MULTIVITAMIN) tablet Take 1 tablet by mouth daily.      . Omega-3 Fatty Acids (FISH OIL) 1200 MG CAPS Take 1 capsule by mouth daily.      Marland Kitchen omeprazole (PRILOSEC) 20 MG capsule Take 20 mg by mouth daily.      . predniSONE (DELTASONE) 10 MG tablet Take 1 tablet (10 mg total) by mouth daily with breakfast. In addition to current Prednisone 7 tablet 0  . simvastatin (ZOCOR) 20 MG tablet Take 20 mg by mouth at bedtime.       No current facility-administered medications on file prior to visit.

## 2015-02-08 NOTE — Telephone Encounter (Signed)
Spoke with pt and is aware RX's sent in. Nothing further needed

## 2015-02-08 NOTE — Telephone Encounter (Signed)
lmomtcb x1 

## 2015-02-08 NOTE — Telephone Encounter (Signed)
Prednisone 10 mg,   # 9    2 tabs x 3 days, then 1 tab x 3 days

## 2015-02-15 ENCOUNTER — Other Ambulatory Visit: Payer: Self-pay | Admitting: Dermatology

## 2015-02-15 ENCOUNTER — Ambulatory Visit (INDEPENDENT_AMBULATORY_CARE_PROVIDER_SITE_OTHER): Payer: Medicare Other

## 2015-02-15 DIAGNOSIS — J4540 Moderate persistent asthma, uncomplicated: Secondary | ICD-10-CM

## 2015-02-15 MED ORDER — OMALIZUMAB 150 MG ~~LOC~~ SOLR
225.0000 mg | Freq: Once | SUBCUTANEOUS | Status: AC
  Administered 2015-02-15: 225 mg via SUBCUTANEOUS

## 2015-03-11 ENCOUNTER — Telehealth: Payer: Self-pay | Admitting: Internal Medicine

## 2015-03-12 NOTE — Telephone Encounter (Signed)
#   Vials:2 Arrival Date:03-12-15 Lot #:[id number] Exp Date:10-2018   

## 2015-03-12 NOTE — Telephone Encounter (Signed)
#   vials:2 Ordered date:03-11-15 Shipping Date:03-12-15  

## 2015-03-15 ENCOUNTER — Encounter: Payer: Self-pay | Admitting: Internal Medicine

## 2015-03-15 ENCOUNTER — Ambulatory Visit (INDEPENDENT_AMBULATORY_CARE_PROVIDER_SITE_OTHER): Payer: Medicare Other | Admitting: Internal Medicine

## 2015-03-15 VITALS — BP 130/70 | HR 88 | Ht 60.0 in | Wt 145.2 lb

## 2015-03-15 DIAGNOSIS — J45998 Other asthma: Secondary | ICD-10-CM | POA: Diagnosis not present

## 2015-03-15 DIAGNOSIS — K219 Gastro-esophageal reflux disease without esophagitis: Secondary | ICD-10-CM | POA: Diagnosis not present

## 2015-03-15 DIAGNOSIS — Z23 Encounter for immunization: Secondary | ICD-10-CM | POA: Diagnosis not present

## 2015-03-15 NOTE — Progress Notes (Signed)
Patient ID: Deborah Branch, female    DOB: 1935-02-17, 80 y.o.   MRN: [id number]  HPI 03/14/11- 80 year old female never smoker followed for chronic asthma, allergic rhinosinusitis complicated by GERD Last here March 14, 2010 Good year- still doing very well on Xolair shots monthly.  Continues Advair once daily but almost never needs to use her rescue inhaler. Continues flonase about every other day- I gave permission to try off it.  No recognized asthma in a very long time.   06/23/11- 80 year old female never smoker followed for chronic asthma, allergic rhinosinusitis complicated by GERD She has continued Xolair and it still works well for her. Has not needed Advair but still has prescription if she needs to restart. In the last 2 days she has started cough, dry or productive of yellow nonbloody sputum, some chills without fever, mild sore throat, no myalgias, no GI upset.  03/12/12- 80 year old female never smoker followed for chronic asthma(Xolair), allergic rhino-sinusitis complicated by GERD   PCP Dr Regino Schultze Still on Xolair and no flare ups since viral infection last winter.. Minor sneezing if she sits outdoors. Off of maintenance medications, never needing rescue inhaler. We discussed long-term use of Xolair and studies that are looking at that now. She is reluctant to come off of Xolair which she associates with significantly improved quality of life.  07/03/12- 80 year old female never smoker followed for chronic asthma(Xolair), allergic rhino-sinusitis complicated by GERD   PCP Dr Regino Schultze ACUTE VISIT: Increased wheezing and SOB, cough-productive-yellow, denies any chills/unsure of fevers. 2-3 days ago onset sore throat, cough yellow sputum, chest tight, wheeze. No fever, myalgias, GI. We sent Zpak. Her nebulizer is modest help only.  03/12/13- 80 year old female never smoker followed for chronic asthma(Xolair), allergic rhino-sinusitis complicated by GERD   PCP Dr Regino Schultze FOLLOWS  FOR: recently had flare up and used Prednisone; continues to take Xolair and no troubles. Flare of asthma several weeks ago, reason unclear. Responded well to prednisone which is the first time she has needed it in a long time. Occasional Zyrtec for rhinitis.  12/05/13-80 year old female never smoker followed for chronic asthma(Xolair), allergic rhino-sinusitis complicated by GERD   PCP Dr Regino Schultze FOLLOWS FOR: Pt c/o sore throat and dry cough x 3 days. C/o chest tightness with and without activity.  Going out of town next week and concerned about acute illness. Denies fever chills or purulent sputum. CXR 07/13/12 IMPRESSION:  Hyperinflation and may indicate COPD. No acute abnormality.  Original Report Authenticated By: Janeece Riggers, M.D.  03/12/14- 80 year old female never smoker followed for chronic asthma(Xolair), allergic rhino-sinusitis complicated by GERD   PCP Dr Regino Schultze FOLLOWS FOR: Pt denies SOB, cough and CP. Pt states she is doing very well with the Xolair. Pt states overall she is doing very well.   03/15/15- 80 year old female never smoker followed for chronic asthma(Xolair), allergic rhino-sinusitis complicated by GERD   Follows For: pt sates she is doing pretty good;she does have her episodes of wheezing every now and again and uses  her rescue inhailer with relief if needed. pt taking xolair injections every month and it is working well for.  Xolair continues good. Once or twice a year needs prednisone taper- latest 3-4 months ago.  Rare need for rescue inhaler.   Review of Systems- see HPI Constitutional:   No-   weight loss, night sweats, fevers, chills, fatigue, lassitude. HEENT:   No-  headaches, difficulty swallowing, tooth/dental problems, +sore throat,       No-  sneezing, itching,  ear ache, +nasal congestion, post nasal drip,  CV:  No-   chest pain, orthopnea, PND, swelling in lower extremities, anasarca, dizziness, palpitations Resp: no-shortness of breath with exertion or  at rest.              No-  productive cough,  + non-productive cough,  No-  coughing up of blood.            No- change in color of mucus.  No-wheezing.   Skin: No-   rash or lesions. GI:  No-   heartburn, indigestion, abdominal pain, nausea, vomiting,  GU: MS:  No-   joint pain or swelling. Neuro- grossly normal  Psych:  No- change in mood or affect. No depression or anxiety.  No memory loss.     Objective:   Physical Exam General- Alert, Oriented, Affect-appropriate, Distress- none acute  Medium build Skin- rash-none, lesions- none, excoriation- none Lymphadenopathy- none Head- atraumatic            Eyes- Gross vision intact, PERRLA, conjunctivae clear secretions            Ears- Hearing, canals normal            Nose- + minor sniffing, no-Septal dev, mucus, polyps, erosion, perforation             Throat- Mallampati III-IV , mucosa clear- not red , drainage- none, tonsils- atrophic Neck- flexible , trachea midline, no stridor , thyroid nl, carotid no bruit Chest - symmetrical excursion , unlabored           Heart/CV- RRR , no murmur , no gallop  , no rub, nl s1 s2                           - JVD- none , edema- none, stasis changes- none, varices- none           Lung-  Clear, wheeze-none , dullness-none, rub- none, cough + dry           Chest wall-  Abd-  Br/ Gen/ Rectal- Not done, not indicated Extrem- cyanosis- none, clubbing, none, atrophy- none, strength- nl Neuro- grossly intact to observation

## 2015-03-15 NOTE — Patient Instructions (Addendum)
Flu vax  We can continue xolair  Ok to call when your albuterol rescue inhaler needs refilling

## 2015-03-16 ENCOUNTER — Encounter: Payer: Self-pay | Admitting: Internal Medicine

## 2015-03-16 NOTE — Assessment & Plan Note (Signed)
Reminded reflux precautions 

## 2015-03-16 NOTE — Assessment & Plan Note (Signed)
She looks great and feels very well controlled. Needs to continue Xolair and likely to need pred taper once or twice/ year for breakthrough,m but no hosp for asthma. Plan- flu vax

## 2015-03-17 ENCOUNTER — Ambulatory Visit (INDEPENDENT_AMBULATORY_CARE_PROVIDER_SITE_OTHER): Payer: Medicare Other

## 2015-03-17 DIAGNOSIS — J4540 Moderate persistent asthma, uncomplicated: Secondary | ICD-10-CM | POA: Diagnosis not present

## 2015-03-17 MED ORDER — OMALIZUMAB 150 MG ~~LOC~~ SOLR
225.0000 mg | Freq: Once | SUBCUTANEOUS | Status: AC
  Administered 2015-03-17: 225 mg via SUBCUTANEOUS

## 2015-04-02 ENCOUNTER — Telehealth: Payer: Self-pay | Admitting: Internal Medicine

## 2015-04-02 MED ORDER — PREDNISONE 20 MG PO TABS: 20.0000 mg | ORAL_TABLET | Freq: Every day | ORAL | Status: DC

## 2015-04-02 MED ORDER — AZITHROMYCIN 250 MG PO TABS: ORAL_TABLET | ORAL | Status: AC

## 2015-04-02 NOTE — Telephone Encounter (Signed)
Spoke with pt. Reports increased SOB and chest tightness. Denies coughing, wheezing or fever. Onset was 1 week ago. Has been using neb solution and rescue inhaler with no relief. Would like something called in.  Allergies  Allergen Reactions  . Clarithromycin     REACTION: itching  . Penicillins     REACTION: hives   Current Outpatient Prescriptions on File Prior to Visit  Medication Sig Dispense Refill  . albuterol (PROAIR HFA) 108 (90 BASE) MCG/ACT inhaler Inhale 2 puffs into the lungs every 4 (four) hours as needed for wheezing or shortness of breath. 1 Inhaler prn  . aspirin 81 MG tablet Take 81 mg by mouth daily.      . Calcium Carbonate (CALTRATE 600) 1500 MG TABS Take 1 tablet by mouth daily.      . cetirizine (ZYRTEC) 10 MG tablet Take 10 mg by mouth daily as needed.     . Cholecalciferol (VITAMIN D) 2000 UNITS CAPS Take 1 capsule by mouth daily.      Marland Kitchen losartan-hydrochlorothiazide (HYZAAR) 50-12.5 MG per tablet Take 1 tablet by mouth daily.  1  . omalizumab (XOLAIR) 150 MG injection Inject into the skin every 28 (twenty-eight) days.    Marland Kitchen omeprazole (PRILOSEC) 20 MG capsule Take 20 mg by mouth daily.      . simvastatin (ZOCOR) 20 MG tablet Take 20 mg by mouth at bedtime.       No current facility-administered medications on file prior to visit.    CY - please advise. Thanks.

## 2015-04-02 NOTE — Telephone Encounter (Signed)
Pt is aware of CY's recommendation. There is a message that was sent to CY on the pt's husband as well and CY documented to send in a Zpack for [place]. Rx's has been sent in. Nothing further was needed.

## 2015-04-02 NOTE — Telephone Encounter (Signed)
Suggest prednisone 20 mg, # 5, 1 daily

## 2015-04-06 NOTE — Telephone Encounter (Signed)
#   vials:2 Ordered date:04/02/15 Shipping Date:04/05/15

## 2015-04-06 NOTE — Telephone Encounter (Signed)
#   Vials:2 Arrival Date:04/06/15 Lot #:[id number]  Exp Date:5/20  

## 2015-04-16 ENCOUNTER — Ambulatory Visit (INDEPENDENT_AMBULATORY_CARE_PROVIDER_SITE_OTHER): Payer: Medicare Other

## 2015-04-16 DIAGNOSIS — J4540 Moderate persistent asthma, uncomplicated: Secondary | ICD-10-CM

## 2015-04-16 MED ORDER — OMALIZUMAB 150 MG ~~LOC~~ SOLR
225.0000 mg | Freq: Once | SUBCUTANEOUS | Status: AC
  Administered 2015-04-16: 225 mg via SUBCUTANEOUS

## 2015-04-27 ENCOUNTER — Ambulatory Visit (INDEPENDENT_AMBULATORY_CARE_PROVIDER_SITE_OTHER): Payer: Medicare Other | Admitting: Adult Health

## 2015-04-27 ENCOUNTER — Telehealth: Payer: Self-pay | Admitting: Internal Medicine

## 2015-04-27 ENCOUNTER — Other Ambulatory Visit: Payer: Self-pay | Admitting: Adult Health

## 2015-04-27 ENCOUNTER — Encounter: Payer: Self-pay | Admitting: Adult Health

## 2015-04-27 VITALS — BP 126/62 | HR 90 | Temp 98.0°F | Ht 60.0 in | Wt 142.0 lb

## 2015-04-27 DIAGNOSIS — R0789 Other chest pain: Secondary | ICD-10-CM | POA: Diagnosis not present

## 2015-04-27 DIAGNOSIS — R05 Cough: Secondary | ICD-10-CM

## 2015-04-27 DIAGNOSIS — R059 Cough, unspecified: Secondary | ICD-10-CM

## 2015-04-27 DIAGNOSIS — J45998 Other asthma: Secondary | ICD-10-CM | POA: Diagnosis not present

## 2015-04-27 DIAGNOSIS — J309 Allergic rhinitis, unspecified: Secondary | ICD-10-CM

## 2015-04-27 MED ORDER — METHYLPREDNISOLONE ACETATE 80 MG/ML IJ SUSP
80.0000 mg | Freq: Once | INTRAMUSCULAR | Status: AC
  Administered 2015-04-27: 80 mg via INTRAMUSCULAR

## 2015-04-27 MED ORDER — LEVALBUTEROL HCL 0.63 MG/3ML IN NEBU
0.6300 mg | INHALATION_SOLUTION | Freq: Once | RESPIRATORY_TRACT | Status: AC
  Administered 2015-04-27: 0.63 mg via RESPIRATORY_TRACT

## 2015-04-27 MED ORDER — PREDNISONE 10 MG PO TABS: ORAL_TABLET | ORAL | Status: DC

## 2015-04-27 NOTE — Patient Instructions (Signed)
Mucinex DM Twice daily  As needed  Cough/congestion  Prednisone taper over next week Please contact office for sooner follow up if symptoms do not improve or worsen or seek emergency care  Follow up Dr. Maple HudsonYoung  As planned

## 2015-04-27 NOTE — Telephone Encounter (Signed)
#   Vials:2 Arrival Date:04/27/15 Lot #[id number] Exp Date:5/19

## 2015-04-27 NOTE — Telephone Encounter (Signed)
Opened note in error.  Closing note.

## 2015-04-28 ENCOUNTER — Telehealth: Payer: Self-pay | Admitting: Adult Health

## 2015-04-28 MED ORDER — ALBUTEROL SULFATE (2.5 MG/3ML) 0.083% IN NEBU: 2.5000 mg | INHALATION_SOLUTION | Freq: Four times a day (QID) | RESPIRATORY_TRACT | Status: DC | PRN

## 2015-04-28 NOTE — Telephone Encounter (Signed)
Spoke with pharmacist at [facility], states that pt needs refill on albuterol neb solution.  This has been sent in.  Nothing further needed.

## 2015-05-02 NOTE — Progress Notes (Signed)
Subjective:    Patient ID: Deborah Branch, female    DOB: 04/15/1935, 80 y.o.   MRN: [id number]  HPI 81 yo female never smoker with chronic asthma (on Xolair) , rhinitis c/by GERD   04/27/15 Acute OV : Asthma flare  Pt presents for an acute office visit. Complains of prod cough with white mucus, chest tightness, SOB, and PND. Sinus drainage and sore throat starting on 04/24/15. Denies fever, nausea or vomiting.  Remains on Xolair and zyrtec.  No fever, or discolored mucus.  No recent abx use, abx use and or ER/hospital visit.  Increased proair use last couple of days.   Past Medical History  Diagnosis Date  . Allergic asthma   . Hyperlipidemia   . Hypertension   . Osteoporosis   . GERD (gastroesophageal reflux disease)    Current Outpatient Prescriptions on File Prior to Visit  Medication Sig Dispense Refill  . albuterol (PROAIR HFA) 108 (90 BASE) MCG/ACT inhaler Inhale 2 puffs into the lungs every 4 (four) hours as needed for wheezing or shortness of breath. 1 Inhaler prn  . aspirin 81 MG tablet Take 81 mg by mouth daily.      . Calcium Carbonate (CALTRATE 600) 1500 MG TABS Take 1 tablet by mouth daily.      . cetirizine (ZYRTEC) 10 MG tablet Take 10 mg by mouth daily as needed.     . Cholecalciferol (VITAMIN D) 2000 UNITS CAPS Take 1 capsule by mouth daily.      Marland Kitchen losartan-hydrochlorothiazide (HYZAAR) 50-12.5 MG per tablet Take 1 tablet by mouth daily.  1  . omalizumab (XOLAIR) 150 MG injection Inject into the skin every 28 (twenty-eight) days.    Marland Kitchen omeprazole (PRILOSEC) 20 MG capsule Take 20 mg by mouth daily.      . simvastatin (ZOCOR) 20 MG tablet Take 20 mg by mouth at bedtime.      . predniSONE (DELTASONE) 20 MG tablet Take 1 tablet (20 mg total) by mouth daily with breakfast. (Patient not taking: Reported on 04/27/2015) 5 tablet 0   No current facility-administered medications on file prior to visit.      Review of Systems Constitutional:   No  weight loss, night sweats,   Fevers, chills, fatigue, or  lassitude.  HEENT:   No headaches,  Difficulty swallowing,  Tooth/dental problems, or  Sore throat,                No sneezing, itching, ear ache,  +nasal congestion, post nasal drip,   CV:  No chest pain,  Orthopnea, PND, swelling in lower extremities, anasarca, dizziness, palpitations, syncope.   GI  No heartburn, indigestion, abdominal pain, nausea, vomiting, diarrhea, change in bowel habits, loss of appetite, bloody stools.   Resp:   No chest wall deformity  Skin: no rash or lesions.  GU: no dysuria, change in color of urine, no urgency or frequency.  No flank pain, no hematuria   MS:  No joint pain or swelling.  No decreased range of motion.  No back pain.  Psych:  No change in mood or affect. No depression or anxiety.  No memory loss.         Objective:   Physical Exam GEN: A/Ox3; pleasant , NAD , elderly   HEENT:  [place]/AT,  EACs-clear, TMs-wnl, NOSE-clear drainage , THROAT-clear, no lesions, no postnasal drip or exudate noted.   NECK:  Supple w/ fair ROM; no JVD; normal carotid impulses w/o bruits; no thyromegaly or nodules  palpated; no lymphadenopathy.no stridor   RESP  Few faint exp wheezes no accessory muscle use, no dullness to percussion, speaks in full sentences   CARD:  RRR, no m/r/g  , no peripheral edema, pulses intact, no cyanosis or clubbing.  GI:   Soft & nt; nml bowel sounds; no organomegaly or masses detected.  Musco: Warm bil, no deformities or joint swelling noted.   Neuro: alert, no focal deficits noted.    Skin: Warm, no lesions or rashes         Assessment & Plan:

## 2015-05-02 NOTE — Assessment & Plan Note (Signed)
Flare  Cont zyrtec and xolair   Plan  Mucinex DM Twice daily  As needed  Cough/congestion  Prednisone taper over next week Please contact office for sooner follow up if symptoms do not improve or worsen or seek emergency care  Follow up Dr. Maple HudsonYoung  As planned

## 2015-05-02 NOTE — Assessment & Plan Note (Signed)
Flare with URI /AR   Plan  Mucinex DM Twice daily  As needed  Cough/congestion  Prednisone taper over next week Please contact office for sooner follow up if symptoms do not improve or worsen or seek emergency care  Follow up Dr. Maple HudsonYoung  As planned

## 2015-05-17 ENCOUNTER — Ambulatory Visit (INDEPENDENT_AMBULATORY_CARE_PROVIDER_SITE_OTHER): Payer: Medicare Other

## 2015-05-17 DIAGNOSIS — J4520 Mild intermittent asthma, uncomplicated: Secondary | ICD-10-CM | POA: Diagnosis not present

## 2015-05-18 MED ORDER — OMALIZUMAB 150 MG ~~LOC~~ SOLR
225.0000 mg | Freq: Once | SUBCUTANEOUS | Status: AC
  Administered 2015-05-18: 225 mg via SUBCUTANEOUS

## 2015-06-02 ENCOUNTER — Telehealth: Payer: Self-pay | Admitting: Internal Medicine

## 2015-06-02 NOTE — Telephone Encounter (Signed)
#   vials:2 Ordered date:06/02/15 Shipping Date:06/02/15 

## 2015-06-03 NOTE — Telephone Encounter (Signed)
#   Vials:2 Arrival Date:06/03/15 Lot #:[id number] Exp Date:6/20  

## 2015-06-15 ENCOUNTER — Ambulatory Visit (INDEPENDENT_AMBULATORY_CARE_PROVIDER_SITE_OTHER): Payer: Medicare Other

## 2015-06-15 DIAGNOSIS — J4540 Moderate persistent asthma, uncomplicated: Secondary | ICD-10-CM

## 2015-06-16 MED ORDER — OMALIZUMAB 150 MG ~~LOC~~ SOLR
225.0000 mg | Freq: Once | SUBCUTANEOUS | Status: AC
  Administered 2015-06-15: 225 mg via SUBCUTANEOUS

## 2015-06-30 ENCOUNTER — Telehealth: Payer: Self-pay | Admitting: Internal Medicine

## 2015-06-30 NOTE — Telephone Encounter (Signed)
#   vials:2 Ordered date:06/30/15 Shipping Date:07/01/15 

## 2015-07-06 NOTE — Telephone Encounter (Signed)
#   Vials:2 Arrival Date:07/06/15 Lot #:[id number] Exp Date:6/20  

## 2015-07-14 ENCOUNTER — Ambulatory Visit (INDEPENDENT_AMBULATORY_CARE_PROVIDER_SITE_OTHER): Payer: Medicare Other

## 2015-07-14 DIAGNOSIS — J4540 Moderate persistent asthma, uncomplicated: Secondary | ICD-10-CM

## 2015-07-14 MED ORDER — OMALIZUMAB 150 MG ~~LOC~~ SOLR
225.0000 mg | Freq: Once | SUBCUTANEOUS | Status: AC
  Administered 2015-07-14: 225 mg via SUBCUTANEOUS

## 2015-07-28 ENCOUNTER — Telehealth: Payer: Self-pay | Admitting: Internal Medicine

## 2015-07-28 NOTE — Telephone Encounter (Signed)
#   vials:2 Ordered date:07/28/15 Shipping Date:07/29/15

## 2015-07-29 NOTE — Telephone Encounter (Signed)
#   Vials:2 Arrival Date:07/29/15 Lot #:[id number] Exp Date:6/20

## 2015-08-10 ENCOUNTER — Telehealth: Payer: Self-pay | Admitting: Internal Medicine

## 2015-08-10 MED ORDER — PREDNISONE 10 MG PO TABS: ORAL_TABLET | ORAL | Status: DC

## 2015-08-10 NOTE — Telephone Encounter (Signed)
Called spoke with pt. Aware of recs below. RX sent in. Nothing further needed 

## 2015-08-10 NOTE — Telephone Encounter (Signed)
Prednisone usually works for her   Offer prednisone 10 mg, # 20, 4 X 2 DAYS, 3 X 2 DAYS, 2 X 2 DAYS, 1 X 2 DAYS

## 2015-08-10 NOTE — Telephone Encounter (Signed)
Called spoke with pt. She c/o increase SOB w/ exertion, chest fullness, wheezing, very little hacking cough. She is using rescue inhaler about 2-3 times a day and taking zyrtec. She is not on prednisone. Wants something called in. Please advise Dr. Maple Hudson thanks  Allergies  Allergen Reactions  . Clarithromycin     REACTION: itching  . Penicillins     REACTION: hives     Current Outpatient Prescriptions on File Prior to Visit  Medication Sig Dispense Refill  . albuterol (PROAIR HFA) 108 (90 BASE) MCG/ACT inhaler Inhale 2 puffs into the lungs every 4 (four) hours as needed for wheezing or shortness of breath. 1 Inhaler prn  . albuterol (PROVENTIL) (2.5 MG/3ML) 0.083% nebulizer solution Take 3 mLs (2.5 mg total) by nebulization every 6 (six) hours as needed for wheezing or shortness of breath. 360 mL 5  . aspirin 81 MG tablet Take 81 mg by mouth daily.      . Calcium Carbonate (CALTRATE 600) 1500 MG TABS Take 1 tablet by mouth daily.      . cetirizine (ZYRTEC) 10 MG tablet Take 10 mg by mouth daily as needed.     . Cholecalciferol (VITAMIN D) 2000 UNITS CAPS Take 1 capsule by mouth daily.      Marland Kitchen losartan-hydrochlorothiazide (HYZAAR) 50-12.5 MG per tablet Take 1 tablet by mouth daily.  1  . omalizumab (XOLAIR) 150 MG injection Inject into the skin every 28 (twenty-eight) days.    Marland Kitchen omeprazole (PRILOSEC) 20 MG capsule Take 20 mg by mouth daily.      . predniSONE (DELTASONE) 10 MG tablet 4 tabs for 2 days, then 3 tabs for 2 days, 2 tabs for 2 days, then 1 tab for 2 days, then stop 20 tablet 0  . predniSONE (DELTASONE) 20 MG tablet Take 1 tablet (20 mg total) by mouth daily with breakfast. (Patient not taking: Reported on 04/27/2015) 5 tablet 0  . simvastatin (ZOCOR) 20 MG tablet Take 20 mg by mouth at bedtime.       No current facility-administered medications on file prior to visit.

## 2015-08-12 ENCOUNTER — Ambulatory Visit (INDEPENDENT_AMBULATORY_CARE_PROVIDER_SITE_OTHER): Payer: Medicare Other

## 2015-08-12 DIAGNOSIS — J4540 Moderate persistent asthma, uncomplicated: Secondary | ICD-10-CM | POA: Diagnosis not present

## 2015-08-12 MED ORDER — OMALIZUMAB 150 MG ~~LOC~~ SOLR
225.0000 mg | Freq: Once | SUBCUTANEOUS | Status: AC
  Administered 2015-08-12: 225 mg via SUBCUTANEOUS

## 2015-08-24 ENCOUNTER — Telehealth: Payer: Self-pay | Admitting: Internal Medicine

## 2015-08-24 NOTE — Telephone Encounter (Signed)
Patient would like to be seen, she is at the beach right now, she will be coming home from beach tomorrow.  Patient having trouble with SOB.  She was taking Prednisone during the time she has been at the beach.  It has not helped her this time, she said that usually it works. Wheezing a lot, raspy sounding voice, chest tightness.    Current Outpatient Prescriptions on File Prior to Visit  Medication Sig Dispense Refill  . albuterol (PROAIR HFA) 108 (90 BASE) MCG/ACT inhaler Inhale 2 puffs into the lungs every 4 (four) hours as needed for wheezing or shortness of breath. 1 Inhaler prn  . albuterol (PROVENTIL) (2.5 MG/3ML) 0.083% nebulizer solution Take 3 mLs (2.5 mg total) by nebulization every 6 (six) hours as needed for wheezing or shortness of breath. 360 mL 5  . aspirin 81 MG tablet Take 81 mg by mouth daily.      . Calcium Carbonate (CALTRATE 600) 1500 MG TABS Take 1 tablet by mouth daily.      . cetirizine (ZYRTEC) 10 MG tablet Take 10 mg by mouth daily as needed.     . Cholecalciferol (VITAMIN D) 2000 UNITS CAPS Take 1 capsule by mouth daily.      Marland Kitchen losartan-hydrochlorothiazide (HYZAAR) 50-12.5 MG per tablet Take 1 tablet by mouth daily.  1  . omalizumab (XOLAIR) 150 MG injection Inject into the skin every 28 (twenty-eight) days.    Marland Kitchen omeprazole (PRILOSEC) 20 MG capsule Take 20 mg by mouth daily.      . predniSONE (DELTASONE) 10 MG tablet 4 tabs for 2 days, then 3 tabs for 2 days, 2 tabs for 2 days, then 1 tab for 2 days, then stop 20 tablet 0  . simvastatin (ZOCOR) 20 MG tablet Take 20 mg by mouth at bedtime.       No current facility-administered medications on file prior to visit.   Allergies  Allergen Reactions  . Clarithromycin     REACTION: itching  . Penicillins     REACTION: hives

## 2015-08-24 NOTE — Telephone Encounter (Signed)
Ok to use a held spsot for me next 2- days, or give to a NP

## 2015-08-24 NOTE — Telephone Encounter (Signed)
Pt scheduled Thursday with TP as pt cannot make an appt tomorrow and cy has no openings.  Nothing further needed.

## 2015-08-26 ENCOUNTER — Encounter: Payer: Self-pay | Admitting: Adult Health

## 2015-08-26 ENCOUNTER — Telehealth: Payer: Self-pay | Admitting: Internal Medicine

## 2015-08-26 ENCOUNTER — Ambulatory Visit (INDEPENDENT_AMBULATORY_CARE_PROVIDER_SITE_OTHER): Payer: Medicare Other | Admitting: Adult Health

## 2015-08-26 ENCOUNTER — Ambulatory Visit (INDEPENDENT_AMBULATORY_CARE_PROVIDER_SITE_OTHER)
Admission: RE | Admit: 2015-08-26 | Discharge: 2015-08-26 | Disposition: A | Discharge status: Home / Self Care | Payer: Medicare Other | Source: Ambulatory Visit | Attending: Adult Health | Admitting: Adult Health

## 2015-08-26 VITALS — BP 126/62 | HR 68 | Temp 98.3°F | Ht 60.0 in | Wt 151.0 lb

## 2015-08-26 DIAGNOSIS — J45998 Other asthma: Secondary | ICD-10-CM

## 2015-08-26 DIAGNOSIS — R0602 Shortness of breath: Secondary | ICD-10-CM

## 2015-08-26 LAB — NITRIC OXIDE: Nitric Oxide: 61

## 2015-08-26 MED ORDER — PREDNISONE 10 MG PO TABS: ORAL_TABLET | ORAL | Status: DC

## 2015-08-26 MED ORDER — MOMETASONE FURO-FORMOTEROL FUM 100-5 MCG/ACT IN AERO: 2.0000 | INHALATION_SPRAY | Freq: Two times a day (BID) | RESPIRATORY_TRACT | Status: DC

## 2015-08-26 MED ORDER — AZITHROMYCIN 250 MG PO TABS: ORAL_TABLET | ORAL | Status: DC

## 2015-08-26 NOTE — Telephone Encounter (Signed)
We can see patient on Monday 10-11-15 at 11:15am. Thanks.

## 2015-08-26 NOTE — Telephone Encounter (Signed)
Please advise on when she can be worked in thanks!

## 2015-08-26 NOTE — Telephone Encounter (Signed)
Spoke with the pt and appt was scheduled  

## 2015-08-26 NOTE — Progress Notes (Signed)
Subjective:    Patient ID: Deborah Branch, female    DOB: 07-Dec-1934, 81 y.o.   MRN: [id number]  HPI 81 yo female never smoker with asthma   08/26/2015 Acute OV : Asthma  Presents for an acute office visit  Complains of 2 weeks of CY pt is here c/o dry cough, chest tightness, SOB, wheezing x 1 week.  Denies any sinus congestion/drainage, chest congestion, fever, nausea or vomiting.      On Xoliar monthly   . Previously on Advair but stopped in 2012 after starting Xolair .  Has asthma flare 2-3 times a year, in which she gets prednisone.  On avg on good days no albuterol use.  Was at beach , says pollen was bad.  Takes zyrtec As needed  .   Spirometry today w/ FEV1 67%, ratio 66. And FENO 61.    Past Medical History  Diagnosis Date  . Allergic asthma   . Hyperlipidemia   . Hypertension   . Osteoporosis   . GERD (gastroesophageal reflux disease)    Current Outpatient Prescriptions on File Prior to Visit  Medication Sig Dispense Refill  . albuterol (PROAIR HFA) 108 (90 BASE) MCG/ACT inhaler Inhale 2 puffs into the lungs every 4 (four) hours as needed for wheezing or shortness of breath. 1 Inhaler prn  . albuterol (PROVENTIL) (2.5 MG/3ML) 0.083% nebulizer solution Take 3 mLs (2.5 mg total) by nebulization every 6 (six) hours as needed for wheezing or shortness of breath. 360 mL 5  . aspirin 81 MG tablet Take 81 mg by mouth daily.      . Calcium Carbonate (CALTRATE 600) 1500 MG TABS Take 1 tablet by mouth daily.      . cetirizine (ZYRTEC) 10 MG tablet Take 10 mg by mouth daily as needed.     . Cholecalciferol (VITAMIN D) 2000 UNITS CAPS Take 1 capsule by mouth daily.      Marland Kitchen losartan-hydrochlorothiazide (HYZAAR) 50-12.5 MG per tablet Take 1 tablet by mouth daily.  1  . omalizumab (XOLAIR) 150 MG injection Inject into the skin every 28 (twenty-eight) days.    Marland Kitchen omeprazole (PRILOSEC) 20 MG capsule Take 20 mg by mouth daily.      . simvastatin (ZOCOR) 20 MG tablet Take 20 mg by mouth at  bedtime.       No current facility-administered medications on file prior to visit.      Review of Systems Constitutional:   No  weight loss, night sweats,  Fevers, chills, fatigue, or  lassitude.  HEENT:   No headaches,  Difficulty swallowing,  Tooth/dental problems, or  Sore throat,                No sneezing, itching, ear ache, nasal congestion, post nasal drip,   CV:  No chest pain,  Orthopnea, PND, swelling in lower extremities, anasarca, dizziness, palpitations, syncope.   GI  No heartburn, indigestion, abdominal pain, nausea, vomiting, diarrhea, change in bowel habits, loss of appetite, bloody stools.   Resp: No shortness of breath with exertion or at rest.  No excess mucus, no productive cough,  No non-productive cough,  No coughing up of blood.  No change in color of mucus.  No wheezing.  No chest wall deformity  Skin: no rash or lesions.  GU: no dysuria, change in color of urine, no urgency or frequency.  No flank pain, no hematuria   MS:  No joint pain or swelling.  No decreased range of motion.  No  back pain.  Psych:  No change in mood or affect. No depression or anxiety.  No memory loss.         Objective:   Physical Exam Filed Vitals:   08/26/15 1430  BP: 126/62  Pulse: 68  Temp: 98.3 F (36.8 C)  TempSrc: Oral  Height: 5' (1.524 m)  Weight: 151 lb (68.493 kg)  SpO2: 97%   GEN: A/Ox3; pleasant , NAD, well nourished   HEENT:  [place]/AT,  EACs-clear, TMs-wnl, NOSE-clear, THROAT-clear, no lesions, no postnasal drip or exudate noted.   NECK:  Supple w/ fair ROM; no JVD; normal carotid impulses w/o bruits; no thyromegaly or nodules palpated; no lymphadenopathy.  RESP  Clear  P & A; w/o, wheezes/ rales/ or rhonchi.no accessory muscle use, no dullness to percussion  CARD:  RRR, no m/r/g  , no peripheral edema, pulses intact, no cyanosis or clubbing.  GI:   Soft & nt; nml bowel sounds; no organomegaly or masses detected.  Musco: Warm bil, no deformities or  joint swelling noted.   Neuro: alert, no focal deficits noted.    Skin: Warm, no lesions or rashes   [name] NP-C  [facility]  08/26/2015       Assessment & Plan:

## 2015-08-26 NOTE — Assessment & Plan Note (Signed)
Recurrent flare  Check cxr today   Plan  Pack to have on hold if symptoms worsen with discolored mucus.  Mucinex DM Twice daily  As needed  Cough/congestion  Prednisone taper over next week  chest xray today  Begin Dulera 2 puffs Twice daily  , rinse after use.  Please contact office for sooner follow up if symptoms do not improve or worsen or seek emergency care  Follow up Dr. Maple Hudson  As planned

## 2015-08-26 NOTE — Addendum Note (Signed)
Addended by: Karalee Height on: 08/26/2015 03:24 PM   Modules accepted: Orders

## 2015-08-26 NOTE — Patient Instructions (Signed)
ZPack to have on hold if symptoms worsen with discolored mucus.  Mucinex DM Twice daily  As needed  Cough/congestion  Prednisone taper over next week  chest xray today  Begin Dulera 2 puffs Twice daily  , rinse after use.  Please contact office for sooner follow up if symptoms do not improve or worsen or seek emergency care  Follow up Dr. Maple Hudson  As planned

## 2015-08-27 ENCOUNTER — Other Ambulatory Visit: Payer: Self-pay | Admitting: Adult Health

## 2015-08-27 DIAGNOSIS — J690 Pneumonitis due to inhalation of food and vomit: Secondary | ICD-10-CM

## 2015-08-27 NOTE — Progress Notes (Signed)
Result Notes     Notes Recorded by Lowell BoutonJessica E Jones, CMA on 08/27/2015 at 5:40 PM Called spoke with patient, advised of cxr results / recs as stated by TP. Pt verbalized her understanding and denied any questions. Pt to take the zpak given at the 3.2.17 office visit and will have her repeat cxr on 3.17.17 when she comes for her Xolair on that date. Orders only encounter created for the cxr. ------  Notes Recorded by Julio Sicksammy S Parrett, NP on 08/27/2015 at 5:16 PM Xray shows increased congestion on left ? Early PNA  Take Zpack ,  cxr in 2-3 weeks  Please contact office for sooner follow up if symptoms do not improve or worsen or seek emergency care

## 2015-08-27 NOTE — Progress Notes (Signed)
Quick Note:  Called spoke with patient, advised of cxr results / recs as stated by TP. Pt verbalized her understanding and denied any questions. Pt to take the zpak given at the 3.2.17 office visit and will have her repeat cxr on 3.17.17 when she comes for her Xolair on that date. Orders only encounter created for the cxr. ______

## 2015-08-30 ENCOUNTER — Telehealth: Payer: Self-pay | Admitting: *Deleted

## 2015-08-30 NOTE — Telephone Encounter (Signed)
[facility] Key:[id number] Submitted for review

## 2015-08-31 NOTE — Telephone Encounter (Signed)
[facility] is approved thru 06/25/2016. Pharmacy informed.

## 2015-09-01 ENCOUNTER — Telehealth: Payer: Self-pay | Admitting: Internal Medicine

## 2015-09-02 NOTE — Telephone Encounter (Signed)
#   vials:2 Ordered date:09/01/15 Shipping Date:09/02/15 

## 2015-09-06 NOTE — Telephone Encounter (Signed)
#   Vials:2 Arrival Date:09/06/15 Lot #:[id number] Exp Date:9/20  

## 2015-09-10 ENCOUNTER — Ambulatory Visit (INDEPENDENT_AMBULATORY_CARE_PROVIDER_SITE_OTHER): Payer: Medicare Other

## 2015-09-10 ENCOUNTER — Telehealth: Payer: Self-pay | Admitting: Adult Health

## 2015-09-10 ENCOUNTER — Ambulatory Visit (INDEPENDENT_AMBULATORY_CARE_PROVIDER_SITE_OTHER)
Admission: RE | Admit: 2015-09-10 | Discharge: 2015-09-10 | Disposition: A | Discharge status: Home / Self Care | Payer: Medicare Other | Source: Ambulatory Visit | Attending: Adult Health | Admitting: Adult Health

## 2015-09-10 DIAGNOSIS — J45998 Other asthma: Secondary | ICD-10-CM | POA: Diagnosis not present

## 2015-09-10 DIAGNOSIS — J690 Pneumonitis due to inhalation of food and vomit: Secondary | ICD-10-CM

## 2015-09-10 MED ORDER — MOMETASONE FURO-FORMOTEROL FUM 100-5 MCG/ACT IN AERO: 2.0000 | INHALATION_SPRAY | Freq: Two times a day (BID) | RESPIRATORY_TRACT | Status: DC

## 2015-09-10 NOTE — Telephone Encounter (Signed)
Spoke with pt, states she needs a rx for dulera to her pharmacy.  This has been sent.  Nothing further needed.

## 2015-09-30 ENCOUNTER — Telehealth: Payer: Self-pay | Admitting: Internal Medicine

## 2015-09-30 NOTE — Telephone Encounter (Signed)
#   Vials:2 Arrival Date:09/30/15 Lot #:[id number] Exp Date:9/20  

## 2015-09-30 NOTE — Telephone Encounter (Addendum)
#   vials:2 Ordered date:09/29/15 Shipping Date:09/30/15 I didn't know Geoffry Paradisexolair had been ordered until it came in today.

## 2015-10-01 ENCOUNTER — Ambulatory Visit: Payer: Medicare Other | Admitting: Internal Medicine

## 2015-10-11 ENCOUNTER — Ambulatory Visit (INDEPENDENT_AMBULATORY_CARE_PROVIDER_SITE_OTHER): Payer: Medicare Other

## 2015-10-11 DIAGNOSIS — J4540 Moderate persistent asthma, uncomplicated: Secondary | ICD-10-CM

## 2015-10-11 MED ORDER — OMALIZUMAB 150 MG ~~LOC~~ SOLR
225.0000 mg | Freq: Once | SUBCUTANEOUS | Status: AC
  Administered 2015-10-11: 225 mg via SUBCUTANEOUS

## 2015-10-19 ENCOUNTER — Telehealth: Payer: Self-pay | Admitting: Internal Medicine

## 2015-10-20 NOTE — Telephone Encounter (Signed)
#   vials:2 Ordered date:10/19/15 Shipping Date:10/20/15

## 2015-10-20 NOTE — Telephone Encounter (Signed)
#   Vials:2 Arrival Date:10/20/15 Lot #[id number]#:[id number] Exp Date:11/20 I didn't realize the xolair order sheet was in here until 5:27. Florentina AddisonKatie is aware.

## 2015-11-10 ENCOUNTER — Ambulatory Visit (INDEPENDENT_AMBULATORY_CARE_PROVIDER_SITE_OTHER): Payer: Medicare Other

## 2015-11-10 ENCOUNTER — Ambulatory Visit (INDEPENDENT_AMBULATORY_CARE_PROVIDER_SITE_OTHER): Payer: Medicare Other | Admitting: Internal Medicine

## 2015-11-10 ENCOUNTER — Encounter: Payer: Self-pay | Admitting: Internal Medicine

## 2015-11-10 VITALS — BP 114/66 | HR 63 | Ht 60.0 in | Wt 146.8 lb

## 2015-11-10 DIAGNOSIS — J45998 Other asthma: Secondary | ICD-10-CM | POA: Diagnosis not present

## 2015-11-10 DIAGNOSIS — J0101 Acute recurrent maxillary sinusitis: Secondary | ICD-10-CM

## 2015-11-10 DIAGNOSIS — J4540 Moderate persistent asthma, uncomplicated: Secondary | ICD-10-CM

## 2015-11-10 MED ORDER — OMALIZUMAB 150 MG ~~LOC~~ SOLR
225.0000 mg | Freq: Once | SUBCUTANEOUS | Status: AC
  Administered 2015-11-10: 225 mg via SUBCUTANEOUS

## 2015-11-10 NOTE — Assessment & Plan Note (Signed)
She describes symptoms as largely resolved now after exacerbation which may have been viral/allergic. Plan-nasal saline rinse as needed.

## 2015-11-10 NOTE — Progress Notes (Signed)
Patient ID: Deborah Branch, female    DOB: [birth year], 81 y.o.   MRN: [id number]  HPI 03/14/11- 81 year old female never smoker followed for chronic asthma, allergic rhinosinusitis complicated by GERD Last here March 14, 2010 Good year- still doing very well on Xolair shots monthly.  Continues Advair once daily but almost never needs to use her rescue inhaler. Continues flonase about every other day- I gave permission to try off it.  No recognized asthma in a very long time.   06/23/11- 81 year old female never smoker followed for chronic asthma, allergic rhinosinusitis complicated by GERD She has continued Xolair and it still works well for her. Has not needed Advair but still has prescription if she needs to restart. In the last 2 days she has started cough, dry or productive of yellow nonbloody sputum, some chills without fever, mild sore throat, no myalgias, no GI upset.  03/12/12- 81 year old female never smoker followed for chronic asthma(Xolair), allergic rhino-sinusitis complicated by GERD   PCP Dr Regino SchultzeMcGough Still on Xolair and no flare ups since viral infection last winter.. Minor sneezing if she sits outdoors. Off of maintenance medications, never needing rescue inhaler. We discussed long-term use of Xolair and studies that are looking at that now. She is reluctant to come off of Xolair which she associates with significantly improved quality of life.  07/03/12- 81 year old female never smoker followed for chronic asthma(Xolair), allergic rhino-sinusitis complicated by GERD   PCP Dr Regino SchultzeMcGough ACUTE VISIT: Increased wheezing and SOB, cough-productive-yellow, denies any chills/unsure of fevers. 2-3 days ago onset sore throat, cough yellow sputum, chest tight, wheeze. No fever, myalgias, GI. We sent Zpak. Her nebulizer is modest help only.  03/12/13- 81 year old female never smoker followed for chronic asthma(Xolair), allergic rhino-sinusitis complicated by GERD   PCP Dr Regino SchultzeMcGough FOLLOWS  FOR: recently had flare up and used Prednisone; continues to take Xolair and no troubles. Flare of asthma several weeks ago, reason unclear. Responded well to prednisone which is the first time she has needed it in a long time. Occasional Zyrtec for rhinitis.  12/05/13-81 year old female never smoker followed for chronic asthma(Xolair), allergic rhino-sinusitis complicated by GERD   PCP Dr Regino SchultzeMcGough FOLLOWS FOR: Pt c/o sore throat and dry cough x 3 days. C/o chest tightness with and without activity.  Going out of town next week and concerned about acute illness. Denies fever chills or purulent sputum. CXR 07/13/12 IMPRESSION:  Hyperinflation and may indicate COPD. No acute abnormality.  Original Report Authenticated By: Janeece Riggersavid Clark, M.D.  03/12/14- 81 year old female never smoker followed for chronic asthma(Xolair), allergic rhino-sinusitis complicated by GERD   PCP Dr Regino SchultzeMcGough FOLLOWS FOR: Pt denies SOB, cough and CP. Pt states she is doing very well with the Xolair. Pt states overall she is doing very well.   03/15/15- 81 year old female never smoker followed for chronic asthma(Xolair), allergic rhino-sinusitis complicated by GERD   Follows For: pt sates she is doing pretty good;she does have her episodes of wheezing every now and again and uses  her rescue inhailer with relief if needed. pt taking xolair injections every month and it is working well for.  Xolair continues good. Once or twice a year needs prednisone taper- latest 3-4 months ago.  Rare need for rescue inhaler.   11/10/2015-  81 year old female never smoker followed for chronic asthma (Xolair) allergic rhinosinusitis, complicated by GERD LOV-NP- 08/26/15- asthma flare in pollen season while at the beach. Given Dulera, prednisone taper,CXR FOLLOWS FOR: Still on Xolair; Pt states she is doing  well with her breathing. Seen TP on 08-26-15 for SOB-"was in bad shape-no relief with neb tx's"-was placed on prednisone rx and CXR-"close to  PNA"-09-10-15 follow up CXR and then see CY. Complains now of sinusitis 2 weeks initially treated by primary infection in [facility],. Slowly getting better. Asthma control has been pretty good. Complains of cost of Dulera. Using rescue once or twice daily at most now CXR 09/10/2015-chronic hyperinflation and changes of chronic bronchitis  Review of Systems- see HPI Constitutional:   No-   weight loss, night sweats, fevers, chills, fatigue, lassitude. HEENT:   No-  headaches, difficulty swallowing, tooth/dental problems, +sore throat,       No-  sneezing, itching, ear ache, +nasal congestion, post nasal drip,  CV:  No-   chest pain, orthopnea, PND, swelling in lower extremities, anasarca, dizziness, palpitations Resp: no-shortness of breath with exertion or at rest.              No-  productive cough,  + non-productive cough,  No-  coughing up of blood.            No- change in color of mucus.  No-wheezing.   Skin: No-   rash or lesions. GI:  No-   heartburn, indigestion, abdominal pain, nausea, vomiting,  GU: MS:  No-   joint pain or swelling. Neuro- grossly normal  Psych:  No- change in mood or affect. No depression or anxiety.  No memory loss.     Objective:   Physical Exam General- Alert, Oriented, Affect-appropriate, Distress- none acute  Medium build Skin- rash-none, lesions- none, excoriation- none Lymphadenopathy- none Head- atraumatic            Eyes- Gross vision intact, PERRLA, conjunctivae clear secretions            Ears- Hearing, canals normal            Nose- + minor sniffing, no-Septal dev, mucus, polyps, erosion, perforation             Throat- Mallampati III-IV , mucosa clear- not red , drainage- none, tonsils- atrophic Neck- flexible , trachea midline, no stridor , thyroid nl, carotid no bruit Chest - symmetrical excursion , unlabored           Heart/CV- RRR , no murmur , no gallop  , no rub, nl s1 s2                           - JVD- none , edema- none, stasis  changes- none, varices- none           Lung-  Clear, wheeze-none , dullness-none, rub- none, cough + dry           Chest wall-  Abd-  Br/ Gen/ Rectal- Not done, not indicated Extrem- cyanosis- none, clubbing, none, atrophy- none, strength- nl Neuro- grossly intact to observation

## 2015-11-10 NOTE — Assessment & Plan Note (Signed)
Recent exacerbation mild intermittent asthma, now improving. Plan-continue Xolair. Price comparison with Symbicort, Advair, Briel, Dulera. Okay to taper off Dulera if no longer needed.

## 2015-11-10 NOTE — Patient Instructions (Addendum)
Glad you are feeling better  We can keep your scheduled appointment for September 22  Ok to take an otc antihistamine for allergy drainage, like Zyrtec  Ok to gradually [facility] off [facility] for now, as you find you aren't needing it.   Ok to continue Xolair shots

## 2015-11-17 ENCOUNTER — Telehealth: Payer: Self-pay | Admitting: Internal Medicine

## 2015-11-18 NOTE — Telephone Encounter (Signed)
#   vials:2 Ordered date:11/17/15 Shipping Date:11/18/15

## 2015-11-18 NOTE — Telephone Encounter (Signed)
#   Vials:2 Arrival Date:11/18/15 Lot #[id number]#:[id number] Exp Date:12/20

## 2015-12-09 ENCOUNTER — Ambulatory Visit (INDEPENDENT_AMBULATORY_CARE_PROVIDER_SITE_OTHER): Payer: Medicare Other

## 2015-12-09 DIAGNOSIS — J4540 Moderate persistent asthma, uncomplicated: Secondary | ICD-10-CM | POA: Diagnosis not present

## 2015-12-09 MED ORDER — OMALIZUMAB 150 MG ~~LOC~~ SOLR
225.0000 mg | Freq: Once | SUBCUTANEOUS | Status: AC
  Administered 2015-12-09: 225 mg via SUBCUTANEOUS

## 2015-12-15 ENCOUNTER — Telehealth: Payer: Self-pay | Admitting: Internal Medicine

## 2015-12-15 NOTE — Telephone Encounter (Signed)
#   vials:2 Ordered date:12/15/15 Shipping Date:12/16/15

## 2015-12-16 NOTE — Telephone Encounter (Signed)
#   Vials:2 Arrival Date:12-16-15 Lot #:[id number]#:[id number] Exp Date:05/2019

## 2016-01-07 ENCOUNTER — Ambulatory Visit (INDEPENDENT_AMBULATORY_CARE_PROVIDER_SITE_OTHER): Payer: Medicare Other

## 2016-01-07 DIAGNOSIS — J4540 Moderate persistent asthma, uncomplicated: Secondary | ICD-10-CM | POA: Diagnosis not present

## 2016-01-07 MED ORDER — OMALIZUMAB 150 MG ~~LOC~~ SOLR
225.0000 mg | Freq: Once | SUBCUTANEOUS | Status: AC
  Administered 2016-01-07: 225 mg via SUBCUTANEOUS

## 2016-01-14 ENCOUNTER — Telehealth: Payer: Self-pay | Admitting: Internal Medicine

## 2016-01-14 NOTE — Telephone Encounter (Signed)
#   vials:2 Ordered date:01/14/16 Shipping Date:01/17/16

## 2016-01-17 NOTE — Telephone Encounter (Signed)
#   Vials:2 Arrival Date:01/17/16 Lot #:[id number] Exp Date:12/20

## 2016-02-07 ENCOUNTER — Ambulatory Visit (INDEPENDENT_AMBULATORY_CARE_PROVIDER_SITE_OTHER): Payer: Medicare Other

## 2016-02-07 DIAGNOSIS — J4540 Moderate persistent asthma, uncomplicated: Secondary | ICD-10-CM

## 2016-02-07 MED ORDER — OMALIZUMAB 150 MG ~~LOC~~ SOLR
225.0000 mg | SUBCUTANEOUS | Status: DC
  Administered 2016-02-07: 225 mg via SUBCUTANEOUS

## 2016-02-11 ENCOUNTER — Telehealth: Payer: Self-pay | Admitting: Internal Medicine

## 2016-02-11 NOTE — Telephone Encounter (Signed)
#   vials:2 Ordered date:02/11/16 Shipping Date:02/17/16 

## 2016-02-14 NOTE — Telephone Encounter (Signed)
#   Vials:2 Arrival Date:02/14/16 Lot #:[id number] Exp Date:3/21  

## 2016-03-07 ENCOUNTER — Ambulatory Visit: Payer: Medicare Other

## 2016-03-08 ENCOUNTER — Ambulatory Visit (INDEPENDENT_AMBULATORY_CARE_PROVIDER_SITE_OTHER): Payer: Medicare Other

## 2016-03-08 DIAGNOSIS — J4540 Moderate persistent asthma, uncomplicated: Secondary | ICD-10-CM

## 2016-03-09 DIAGNOSIS — J4540 Moderate persistent asthma, uncomplicated: Secondary | ICD-10-CM | POA: Diagnosis not present

## 2016-03-09 MED ORDER — OMALIZUMAB 150 MG ~~LOC~~ SOLR
225.0000 mg | Freq: Once | SUBCUTANEOUS | Status: AC
  Administered 2016-03-09: 225 mg via SUBCUTANEOUS

## 2016-03-14 ENCOUNTER — Ambulatory Visit: Payer: Medicare Other | Admitting: Internal Medicine

## 2016-03-17 ENCOUNTER — Encounter: Payer: Self-pay | Admitting: Internal Medicine

## 2016-03-17 ENCOUNTER — Ambulatory Visit (INDEPENDENT_AMBULATORY_CARE_PROVIDER_SITE_OTHER): Payer: Medicare Other | Admitting: Internal Medicine

## 2016-03-17 DIAGNOSIS — J3089 Other allergic rhinitis: Secondary | ICD-10-CM

## 2016-03-17 DIAGNOSIS — J309 Allergic rhinitis, unspecified: Secondary | ICD-10-CM | POA: Diagnosis not present

## 2016-03-17 DIAGNOSIS — J45998 Other asthma: Secondary | ICD-10-CM

## 2016-03-17 DIAGNOSIS — Z23 Encounter for immunization: Secondary | ICD-10-CM | POA: Diagnosis not present

## 2016-03-17 DIAGNOSIS — J302 Other seasonal allergic rhinitis: Secondary | ICD-10-CM

## 2016-03-17 MED ORDER — COMPRESSOR/NEBULIZER MISC: 0 refills | Status: DC

## 2016-03-17 NOTE — Patient Instructions (Signed)
We are continuing Xolair injections- glad they have helped so much  Script printed for replacement of broken nebulizer machine. You can take it to [facility].  Flu vax

## 2016-03-17 NOTE — Progress Notes (Signed)
Patient ID: Deborah Branch, female    DOB: 1934/12/03, 81 y.o.   MRN: [id number]  HPI  female never smoker followed for chronic asthma, allergic rhinosinusitis complicated by GERD  03/15/15- 81 year old female never smoker followed for chronic asthma(Xolair), allergic rhino-sinusitis complicated by GERD   Follows For: pt sates she is doing pretty good;she does have her episodes of wheezing every now and again and uses  her rescue inhailer with relief if needed. pt taking xolair injections every month and it is working well for.  Xolair continues good. Once or twice a year needs prednisone taper- latest 3-4 months ago.  Rare need for rescue inhaler.   11/10/2015-  81 year old female never smoker followed for chronic asthma (Xolair) allergic rhinosinusitis, complicated by GERD LOV-NP- 08/26/15- asthma flare in pollen season while at the beach. Given Dulera, prednisone taper,CXR FOLLOWS FOR: Still on Xolair; Pt states she is doing well with her breathing. Seen TP on 08-26-15 for SOB-"was in bad shape-no relief with neb tx's"-was placed on prednisone rx and CXR-"close to PNA"-09-10-15 follow up CXR and then see CY. Complains now of sinusitis 2 weeks initially treated by primary infection in [facility],. Slowly getting better. Asthma control has been pretty good. Complains of cost of Dulera. Using rescue once or twice daily at most now CXR 09/10/2015-chronic hyperinflation and changes of chronic bronchitis  03/17/2016-81 year old female never smoker followed for chronic asthma (Xolair), allergic rhinosinusitis, complicated by GERD FOLLOWS FOR: Pt states that her breathing has been doing well. Tolerating Xolair injection - no new complaints. Wants flu vaccine today. Needs new nebulizer machine (D[facility]) Occasional mild rhinorrhea managed with zyrtec Has done very well continuing Xolair once monthly with no routine need for maintenance or rescue inhalers. Nebulizer machine is broken and she would like to  have a working one available for this winter. We discussed stopping or stretching out the interval between Xolair doses. She was concerned that she wasn't stable enough for that yet.  Review of Systems- see HPI Constitutional:   No-   weight loss, night sweats, fevers, chills, fatigue, lassitude. HEENT:   No-  headaches, difficulty swallowing, tooth/dental problems, +sore throat,       No-  sneezing, itching, ear ache, +nasal congestion, + post nasal drip,  CV:  No-   chest pain, orthopnea, PND, swelling in lower extremities, anasarca, dizziness, palpitations Resp: no-shortness of breath with exertion or at rest.              No-  productive cough,   non-productive cough,  No-  coughing up of blood.            No- change in color of mucus.  No-wheezing.   Skin: No-   rash or lesions. GI:  No-   heartburn, indigestion, abdominal pain, nausea, vomiting,  GU: MS:  No-   joint pain or swelling. Neuro- grossly normal  Psych:  No- change in mood or affect. No depression or anxiety.  No memory loss.     Objective:   Physical Exam General- Alert, Oriented, Affect-appropriate, Distress- none acute  Medium build Skin- rash-none, lesions- none, excoriation- none Lymphadenopathy- none Head- atraumatic            Eyes- Gross vision intact, PERRLA, conjunctivae clear secretions            Ears- Hearing, canals normal            Nose- No sniffing, no-Septal dev, mucus, polyps, erosion, perforation  Throat- Mallampati III-IV , mucosa clear- not red , drainage- none, tonsils- atrophic Neck- flexible , trachea midline, no stridor , thyroid nl, carotid no bruit Chest - symmetrical excursion , unlabored           Heart/CV- RRR , no murmur , no gallop  , no rub, nl s1 s2                           - JVD- none , edema- none, stasis changes- none, varices- none           Lung-  Clear, wheeze-none , dullness-none, rub- none, cough -none           Chest wall-  Abd-  Br/ Gen/ Rectal- Not done, not  indicated Extrem- cyanosis- none, clubbing, none, atrophy- none, strength- nl Neuro- grossly intact to observation

## 2016-03-18 NOTE — Assessment & Plan Note (Signed)
Asthma was originally moderately severe and persistent but has been extremely well controlled on Xolair. Plan-flu vaccine. Continue Xolair. Replace broken nebulizer machine to have available.

## 2016-03-18 NOTE — Assessment & Plan Note (Signed)
OTC antihistamine has been sufficient this summer. No problems with early fall season.

## 2016-03-20 ENCOUNTER — Telehealth: Payer: Self-pay | Admitting: Internal Medicine

## 2016-03-20 NOTE — Telephone Encounter (Signed)
#   vials:2 Ordered date:03/20/16 Shipping Date:03/20/16 

## 2016-03-21 NOTE — Telephone Encounter (Signed)
#   Vials:2 Arrival Date:03/21/16 Lot #:[id number] Exp Date:3/21  

## 2016-04-06 ENCOUNTER — Ambulatory Visit: Payer: Medicare Other

## 2016-04-07 ENCOUNTER — Ambulatory Visit (INDEPENDENT_AMBULATORY_CARE_PROVIDER_SITE_OTHER): Payer: Medicare Other

## 2016-04-07 DIAGNOSIS — J4540 Moderate persistent asthma, uncomplicated: Secondary | ICD-10-CM

## 2016-04-07 MED ORDER — OMALIZUMAB 150 MG ~~LOC~~ SOLR
225.0000 mg | SUBCUTANEOUS | Status: DC
  Administered 2016-04-07: 225 mg via SUBCUTANEOUS

## 2016-04-07 NOTE — Patient Instructions (Signed)
15 units of medication discarded to accomodate patient's 225mg  dosage appropriately.

## 2016-04-13 ENCOUNTER — Telehealth: Payer: Self-pay | Admitting: Internal Medicine

## 2016-04-13 NOTE — Telephone Encounter (Signed)
#   vials:2 Ordered date:04/13/16 Shipping Date:04/14/16 

## 2016-04-14 NOTE — Telephone Encounter (Signed)
#   Vials:2 Arrival Date:04/14/16 Lot #:[id number] Exp Date:4/21  

## 2016-05-08 ENCOUNTER — Ambulatory Visit (INDEPENDENT_AMBULATORY_CARE_PROVIDER_SITE_OTHER): Payer: Medicare Other

## 2016-05-08 DIAGNOSIS — J45998 Other asthma: Secondary | ICD-10-CM | POA: Diagnosis not present

## 2016-05-08 MED ORDER — OMALIZUMAB 150 MG ~~LOC~~ SOLR
225.0000 mg | SUBCUTANEOUS | Status: DC
  Administered 2016-05-08: 225 mg via SUBCUTANEOUS

## 2016-05-09 ENCOUNTER — Telehealth: Payer: Self-pay | Admitting: Internal Medicine

## 2016-05-09 NOTE — Telephone Encounter (Signed)
#   vials:2 Ordered date:05/09/16 Shipping Date:05/09/16

## 2016-05-10 NOTE — Telephone Encounter (Signed)
#   Vials:2 Arrival Date:05/10/16 Lot #[id number] Exp Date:4/21

## 2016-06-06 ENCOUNTER — Ambulatory Visit (INDEPENDENT_AMBULATORY_CARE_PROVIDER_SITE_OTHER): Payer: Medicare Other

## 2016-06-06 DIAGNOSIS — J4540 Moderate persistent asthma, uncomplicated: Secondary | ICD-10-CM | POA: Diagnosis not present

## 2016-06-06 MED ORDER — OMALIZUMAB 150 MG ~~LOC~~ SOLR
225.0000 mg | SUBCUTANEOUS | Status: DC
  Administered 2016-06-06: 225 mg via SUBCUTANEOUS

## 2016-06-06 NOTE — Progress Notes (Signed)
patient came in on 06/06/16  to receive a Xolair injection. The patient is given 225 mg every 14 days. Due to each vial equalling 150mg , 75 mg of medication was wasted.

## 2016-06-20 ENCOUNTER — Telehealth: Payer: Self-pay | Admitting: Internal Medicine

## 2016-06-20 NOTE — Telephone Encounter (Signed)
#   vials:2 Ordered date:06/20/16 Shipping Date:06/21/16 

## 2016-06-22 NOTE — Telephone Encounter (Signed)
#   Vials:2 Arrival Date:06/21/16 Lot #[id number]#:[id number] Exp Date:6/21

## 2016-07-05 ENCOUNTER — Ambulatory Visit (INDEPENDENT_AMBULATORY_CARE_PROVIDER_SITE_OTHER): Payer: Medicare Other

## 2016-07-05 DIAGNOSIS — J4540 Moderate persistent asthma, uncomplicated: Secondary | ICD-10-CM

## 2016-07-07 MED ORDER — OMALIZUMAB 150 MG ~~LOC~~ SOLR
225.0000 mg | SUBCUTANEOUS | Status: DC
  Administered 2016-07-05: 225 mg via SUBCUTANEOUS

## 2016-07-07 NOTE — Progress Notes (Signed)
Patient came in on 05/05/2017 to receive a Xolair injection. The patient is given 225 mg every 28 days. Due to each vial equalling 150mg , 75 mg of medication was wasted. Velvet Batheshley L Caulfield, CMA 07/07/16

## 2016-07-14 ENCOUNTER — Telehealth: Payer: Self-pay | Admitting: Internal Medicine

## 2016-07-14 NOTE — Telephone Encounter (Signed)
#   vials:2 Ordered date:07/14/16 Shipping Date:07/18/16 

## 2016-07-17 NOTE — Telephone Encounter (Signed)
#   Vials:2 Arrival Date:07/17/16 Lot #[id number]#:[id number] Exp Date:8/21

## 2016-07-21 IMAGING — DX DG CHEST 2V
2 series · 2 of 2 positions shown · non-contrast
Comparison: Chest x-ray of July 03, 2012

CLINICAL DATA: Asthma flare, chest tightness and shortness of
breath for the past week, nonsmoker.

EXAM:
CHEST  2 VIEW

[chest pa]
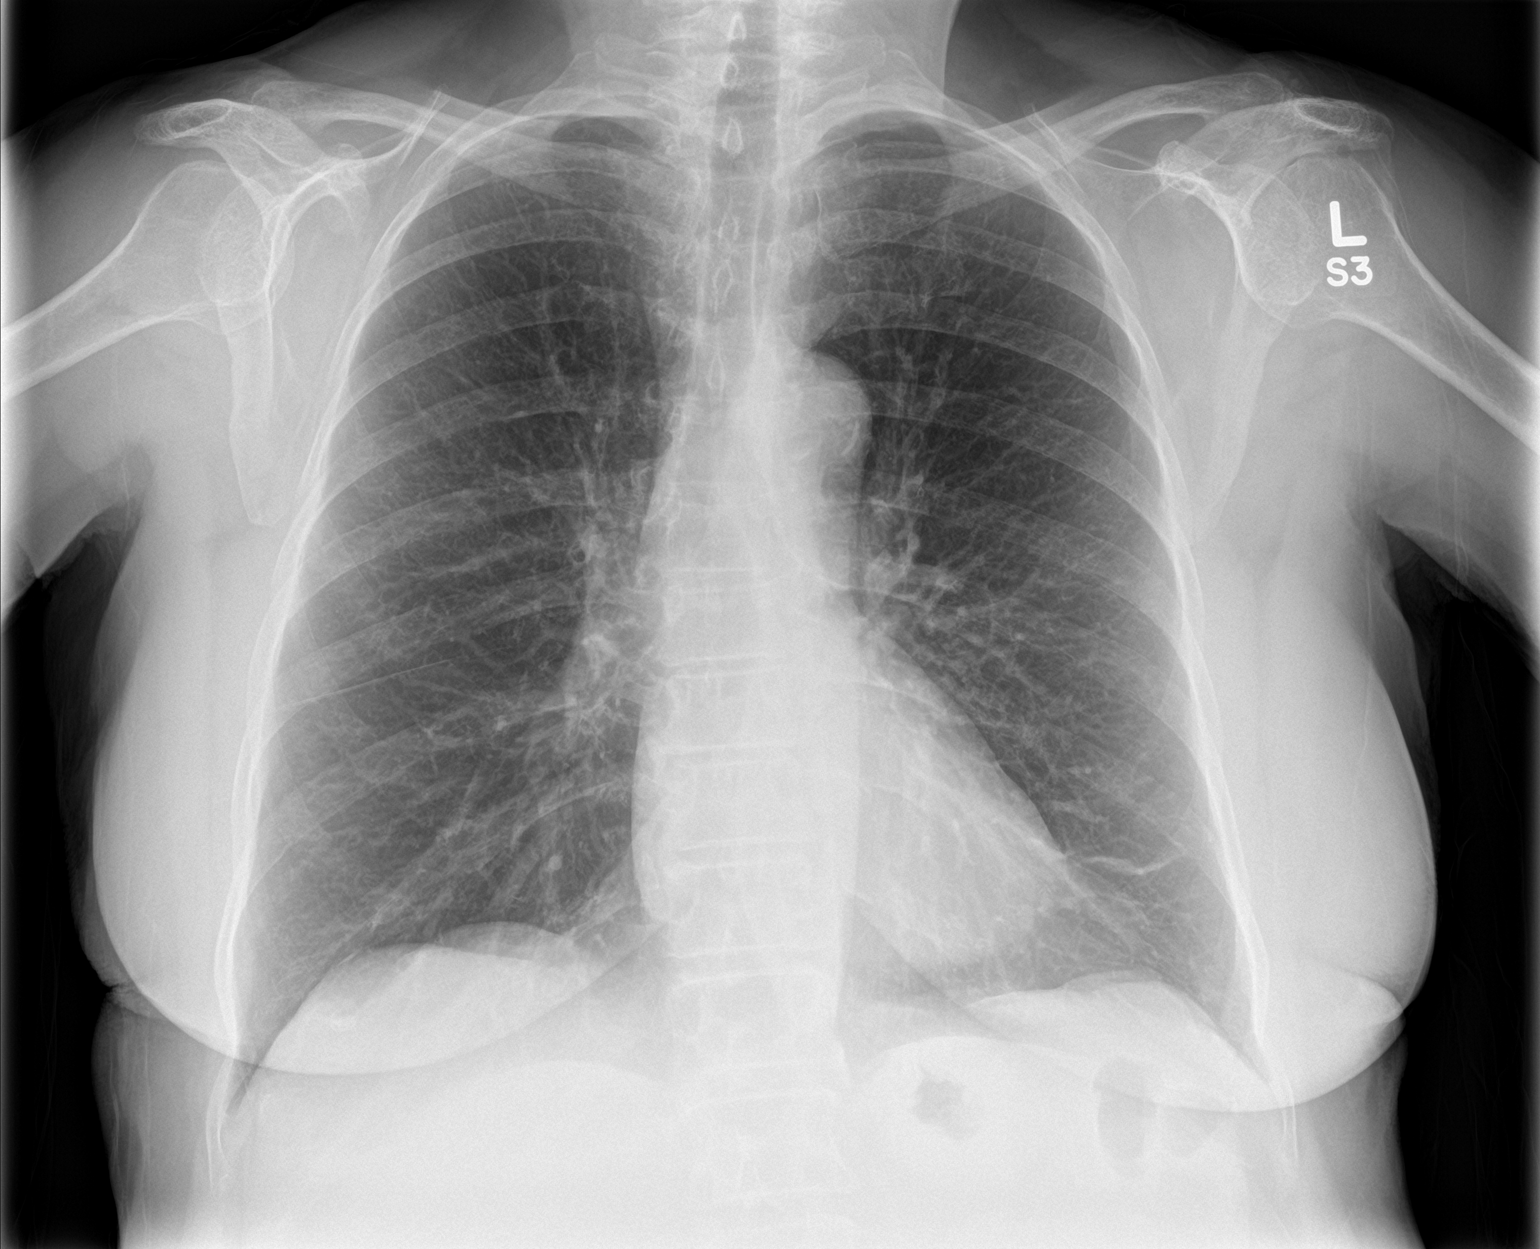

[chest lat]
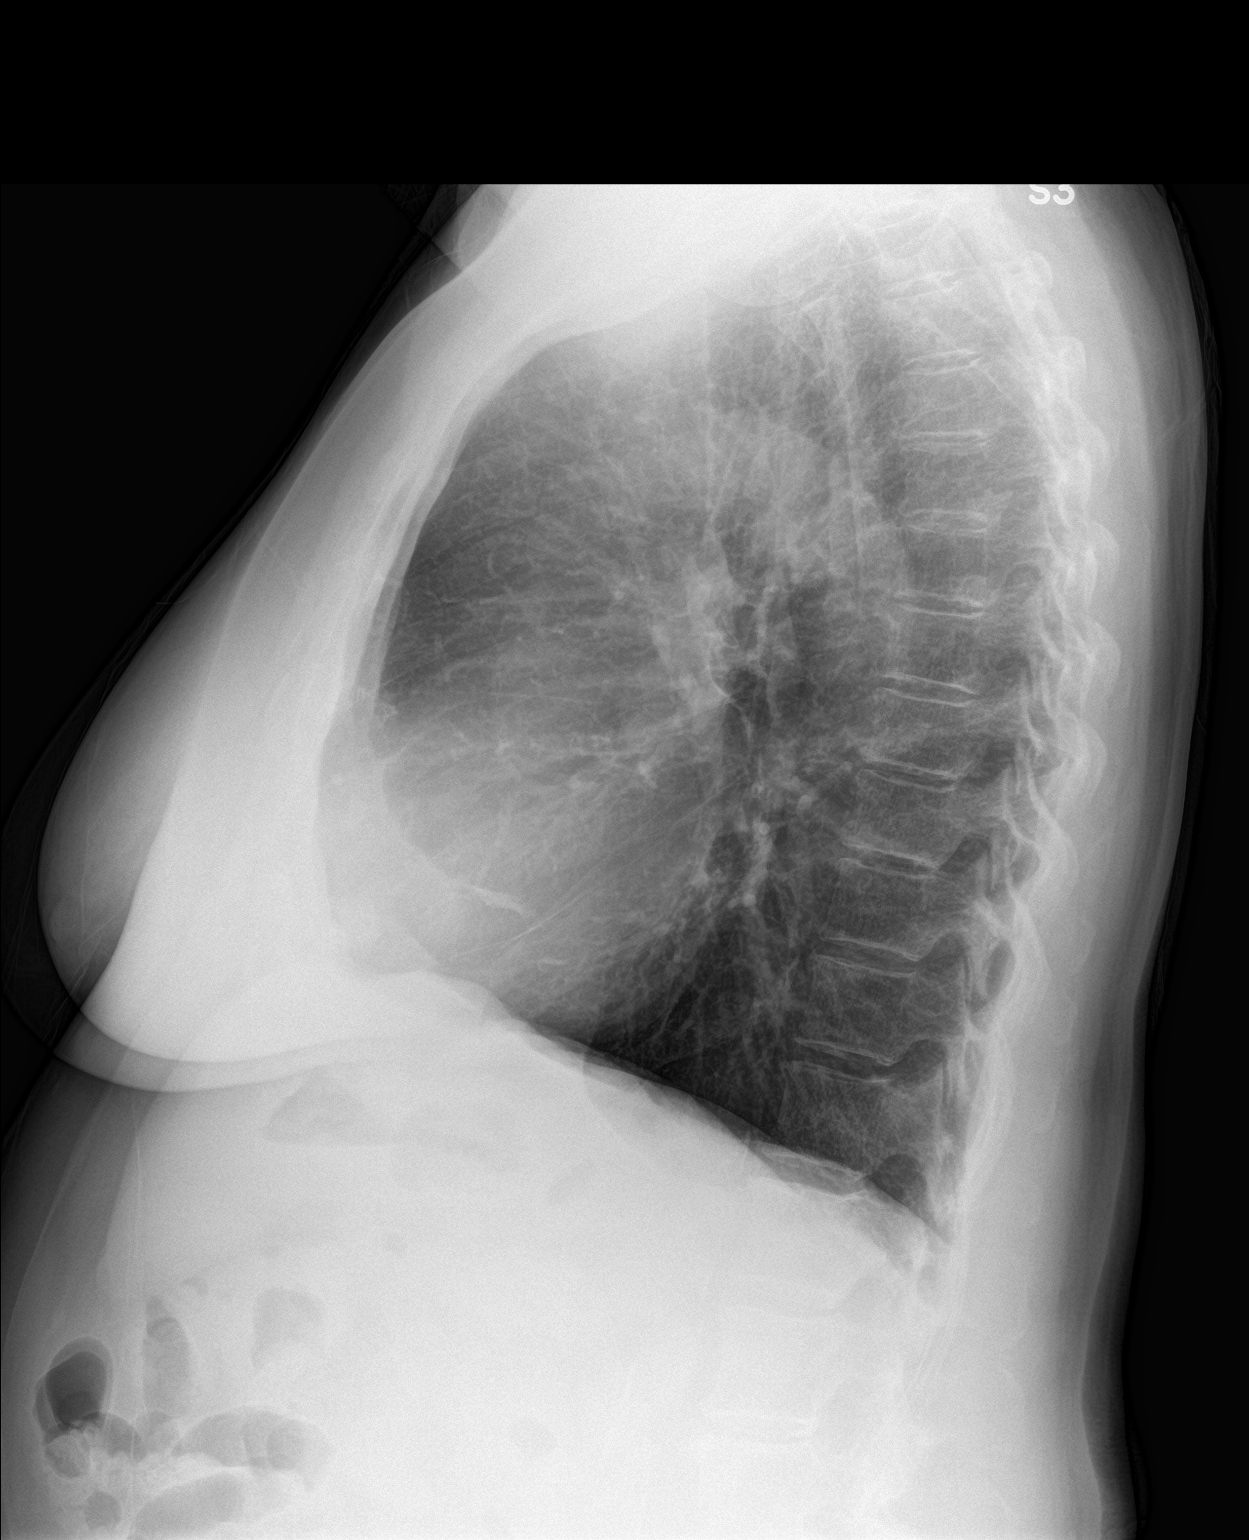

[2 of 2 positions shown; findings below may reference images not displayed]

FINDINGS: The lungs remain hyperinflated. There is a small amount of
atelectasis in the lingula. There is no alveolar infiltrate. There
is no pleural effusion. The heart and pulmonary vascularity are
normal. The bony thorax exhibits no acute abnormality.
IMPRESSION: Reactive airway disease. New subsegmental atelectasis in the
lingula. No alveolar pneumonia.

Follow-up radiographs are recommended to assure clearing.

## 2016-08-03 ENCOUNTER — Ambulatory Visit (INDEPENDENT_AMBULATORY_CARE_PROVIDER_SITE_OTHER): Payer: Medicare Other

## 2016-08-03 ENCOUNTER — Encounter: Payer: Self-pay | Admitting: Internal Medicine

## 2016-08-03 DIAGNOSIS — J4540 Moderate persistent asthma, uncomplicated: Secondary | ICD-10-CM | POA: Diagnosis not present

## 2016-08-04 MED ORDER — OMALIZUMAB 150 MG ~~LOC~~ SOLR
225.0000 mg | SUBCUTANEOUS | Status: DC
  Administered 2016-08-03: 225 mg via SUBCUTANEOUS

## 2016-08-04 NOTE — Progress Notes (Signed)
patient came in on 08/03/2016 to receive a Xolair injection. The patient is given 225mg  every 14 days. Due to each vial equalling 150mg , 15mg  of medication was wasted.

## 2016-08-05 IMAGING — DX DG CHEST 2V
2 series · 2 of 2 positions shown · non-contrast
Comparison: 08/26/2015

CLINICAL DATA: Follow-up pneumonia.  No complaints.

EXAM:
CHEST  2 VIEW

[chest pa]
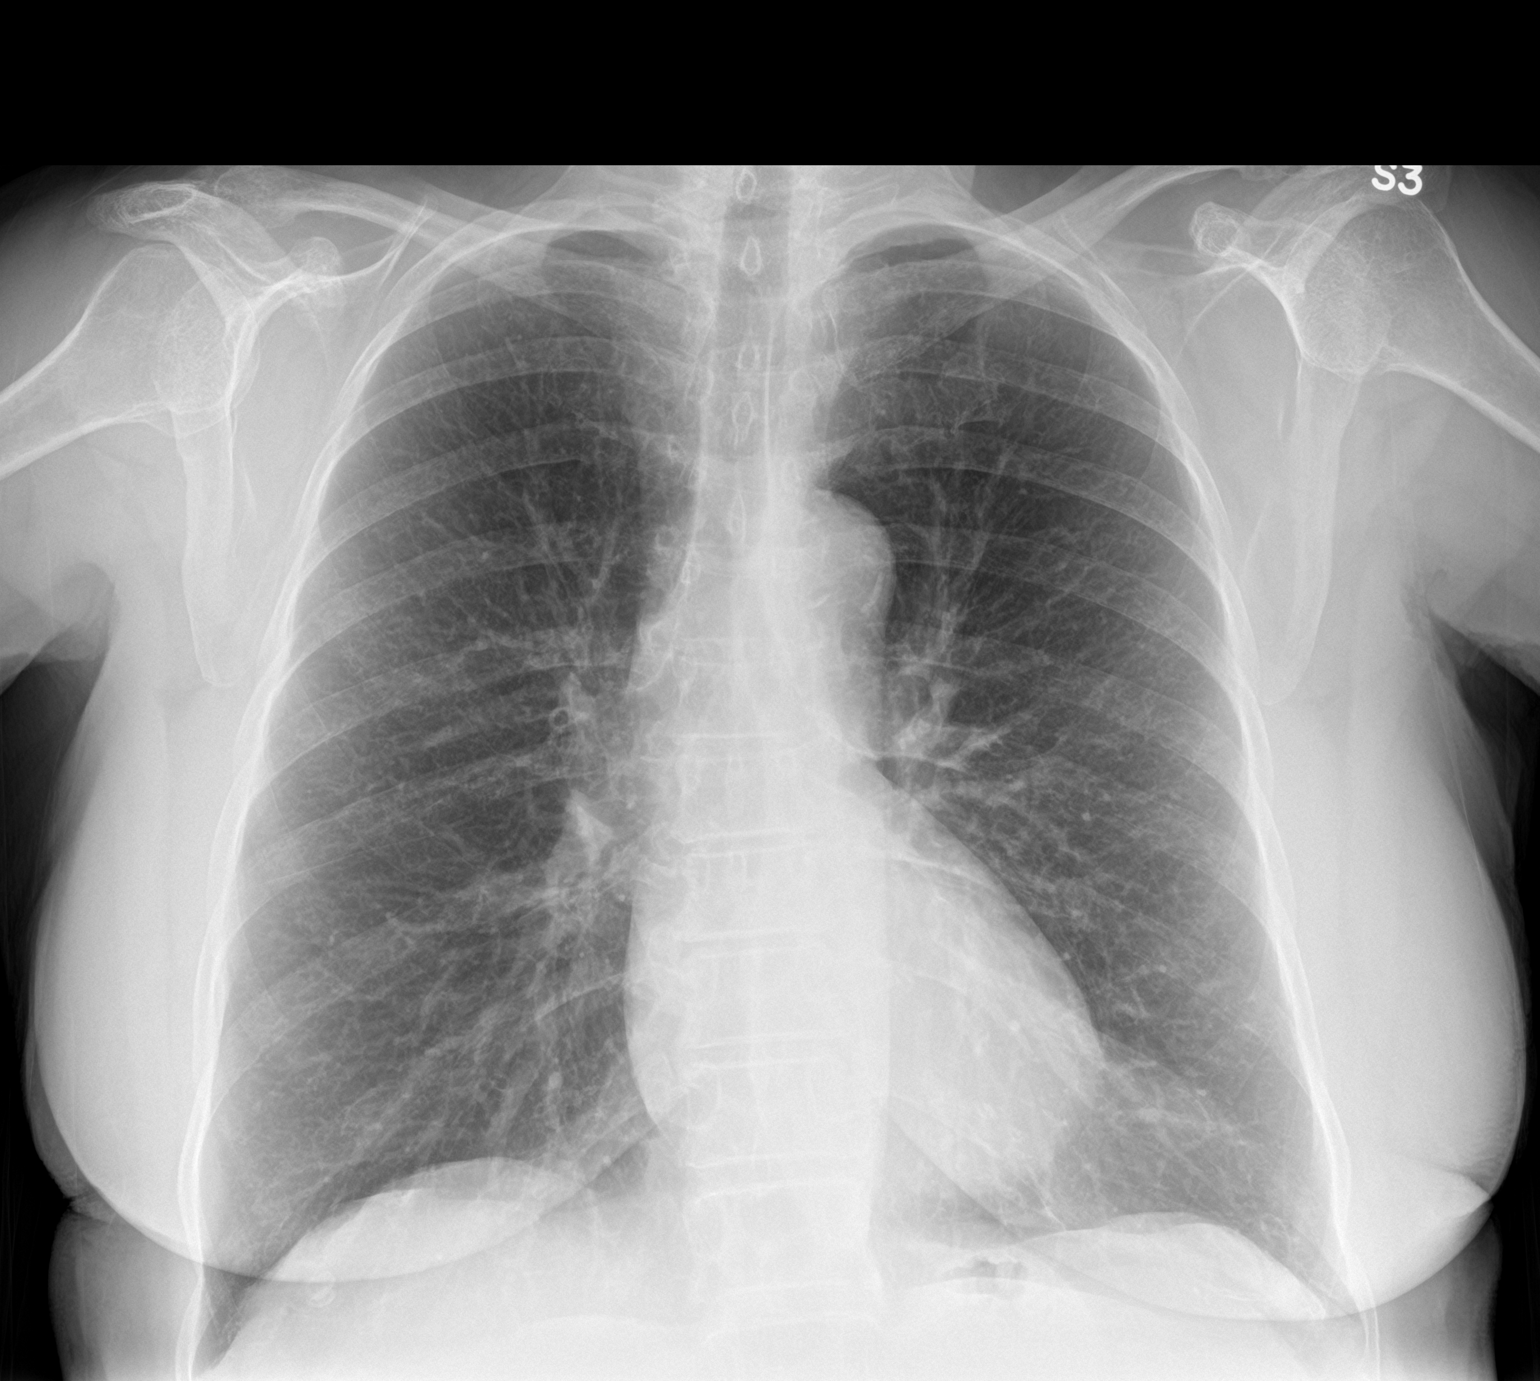

[chest lat]
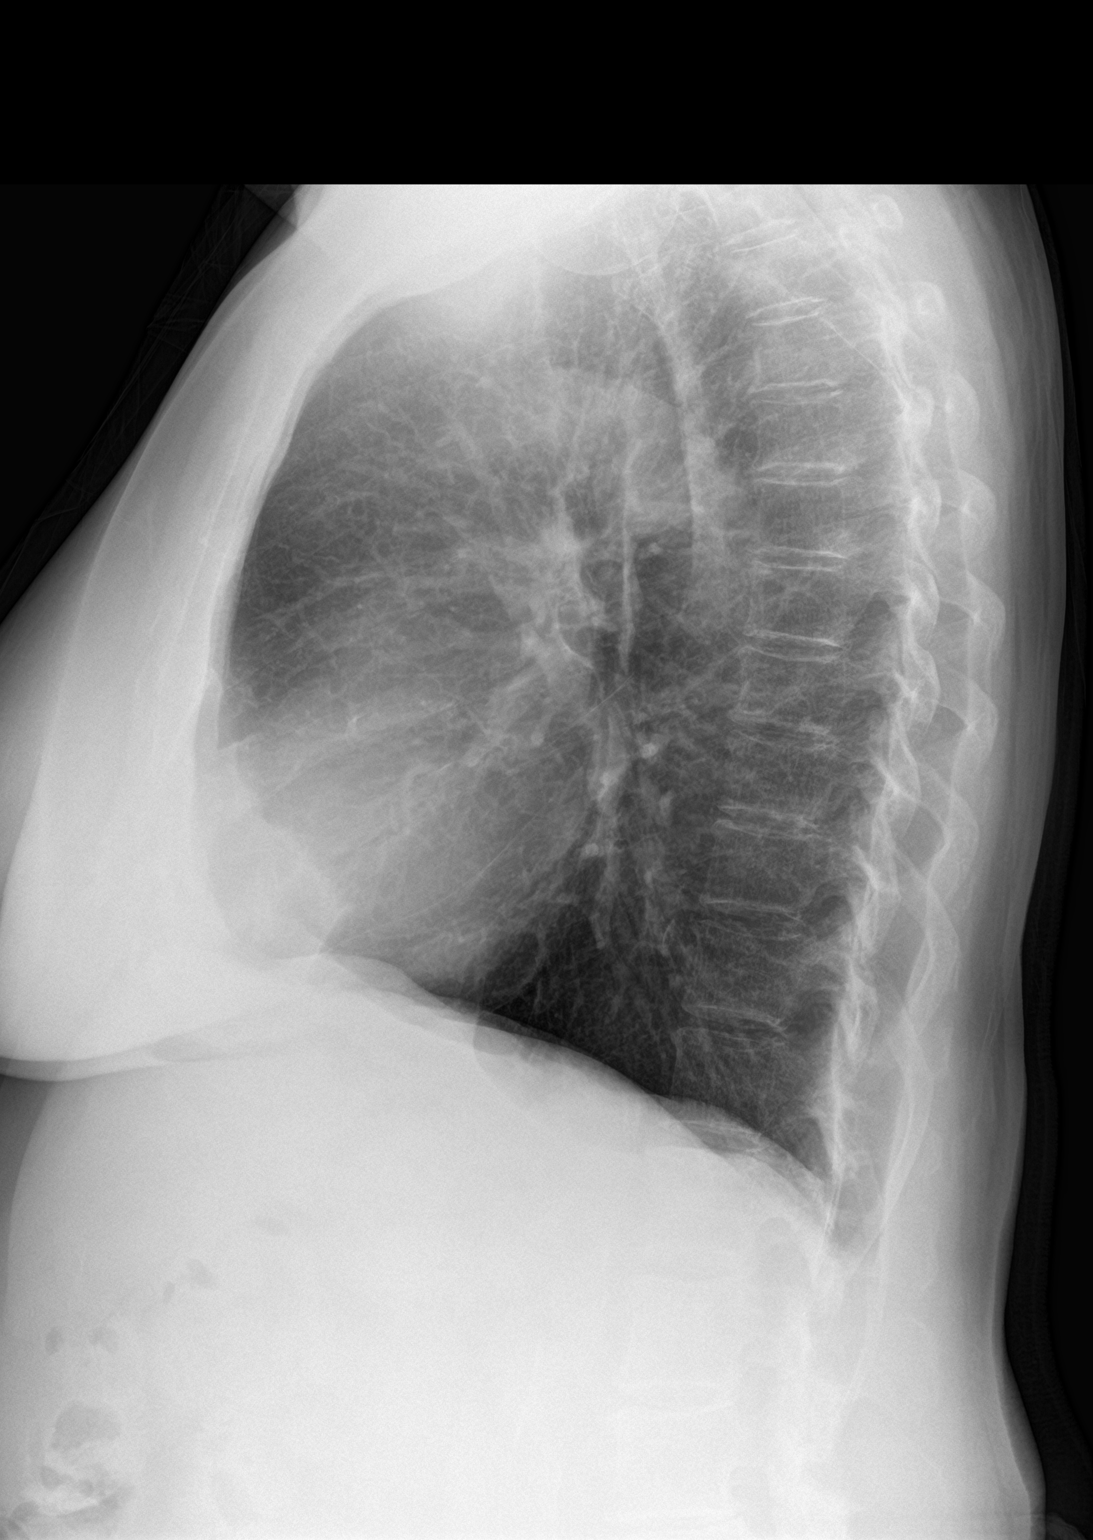

[2 of 2 positions shown; findings below may reference images not displayed]

FINDINGS: The cardiomediastinal silhouette is within normal limits. Thoracic
aortic atherosclerosis is again noted. The lungs are mildly
hyperinflated with similar appearance of mild airway thickening. No
confluent airspace opacity, edema, pleural effusion, or pneumothorax
is identified. Subsegmental atelectasis in the lingula has decreased
or resolved. No acute osseous abnormality is identified.
IMPRESSION: No evidence of acute airspace disease. Chronic hyperinflation and
reactive airways disease or chronic bronchitis.

## 2016-08-08 ENCOUNTER — Telehealth: Payer: Self-pay | Admitting: Internal Medicine

## 2016-08-08 NOTE — Telephone Encounter (Signed)
#   vials:2 Ordered date:08/08/16 Shipping Date:08/08/16 

## 2016-08-09 NOTE — Telephone Encounter (Signed)
#   Vials:2 Arrival Date:08/09/16 Lot #[id number] Exp Date:9/21  

## 2016-08-15 MED ORDER — OMALIZUMAB 150 MG ~~LOC~~ SOLR
225.0000 mg | SUBCUTANEOUS | Status: DC
  Administered 2015-09-10: 225 mg via SUBCUTANEOUS

## 2016-09-01 ENCOUNTER — Ambulatory Visit (INDEPENDENT_AMBULATORY_CARE_PROVIDER_SITE_OTHER): Payer: Medicare Other

## 2016-09-01 DIAGNOSIS — J45998 Other asthma: Secondary | ICD-10-CM

## 2016-09-01 MED ORDER — OMALIZUMAB 150 MG ~~LOC~~ SOLR: 150.0000 mg | Freq: Once | SUBCUTANEOUS | Status: DC

## 2016-09-01 MED ORDER — OMALIZUMAB 150 MG ~~LOC~~ SOLR
225.0000 mg | Freq: Once | SUBCUTANEOUS | Status: AC
  Administered 2016-09-01: 225 mg via SUBCUTANEOUS

## 2016-09-05 ENCOUNTER — Telehealth: Payer: Self-pay | Admitting: Internal Medicine

## 2016-09-06 ENCOUNTER — Telehealth: Payer: Self-pay | Admitting: Internal Medicine

## 2016-09-06 NOTE — Telephone Encounter (Signed)
#   Vials:2 Arrival Date:09/06/16 Lot #:[id number] Exp Date:9/21  

## 2016-09-06 NOTE — Telephone Encounter (Signed)
#   vials:2 Ordered date: 09/05/16 Shipping Date:09/05/16 Ordered after 5:00 and ran out of time.

## 2016-09-06 NOTE — Telephone Encounter (Addendum)
Created in error

## 2016-10-02 ENCOUNTER — Ambulatory Visit (INDEPENDENT_AMBULATORY_CARE_PROVIDER_SITE_OTHER): Payer: Medicare Other

## 2016-10-02 DIAGNOSIS — J4540 Moderate persistent asthma, uncomplicated: Secondary | ICD-10-CM | POA: Diagnosis not present

## 2016-10-02 MED ORDER — OMALIZUMAB 150 MG ~~LOC~~ SOLR
225.0000 mg | SUBCUTANEOUS | Status: DC
  Administered 2016-10-02: 225 mg via SUBCUTANEOUS

## 2016-10-02 NOTE — Progress Notes (Signed)
patient came in on 10/02/16  to receive a Xolair injection. The patient is given  every 14 days. Due to each vial equalling ,  of medication was wasted.

## 2016-10-04 ENCOUNTER — Telehealth: Payer: Self-pay | Admitting: Internal Medicine

## 2016-10-04 NOTE — Telephone Encounter (Signed)
#   vials:2 Ordered date:10/04/16 Shipping Date:10/04/16

## 2016-10-05 NOTE — Telephone Encounter (Signed)
#   Vials:2 Arrival Date:10/05/16 Lot #:[id number] Exp Date:9/21

## 2016-11-01 ENCOUNTER — Ambulatory Visit (INDEPENDENT_AMBULATORY_CARE_PROVIDER_SITE_OTHER): Payer: Medicare Other

## 2016-11-01 DIAGNOSIS — J4540 Moderate persistent asthma, uncomplicated: Secondary | ICD-10-CM

## 2016-11-02 MED ORDER — OMALIZUMAB 150 MG ~~LOC~~ SOLR
225.0000 mg | SUBCUTANEOUS | Status: DC
  Administered 2016-11-01: 225 mg via SUBCUTANEOUS

## 2016-11-02 NOTE — Progress Notes (Signed)
Xolair injection documentation and charges entered by Dorethea ClanAshley Cerenity Goshorn, RMA, based on injection sheet filled out by Dimas Millinammy Elena per office protocol.   patient came in on 11/01/2016 to receive a Xolair injection. The patient is given 225mg  every 14 days. Due to each vial equalling 150mg , 75mg  of medication was wasted.

## 2016-11-07 ENCOUNTER — Telehealth: Payer: Self-pay | Admitting: Internal Medicine

## 2016-11-07 NOTE — Telephone Encounter (Signed)
#   vials:2 Ordered date:11/07/16 Shipping Date:11/07/16

## 2016-11-08 NOTE — Telephone Encounter (Signed)
#   Vials:2 Arrival Date:11/08/16 Lot #:[id number] Exp Date:10/21  

## 2016-11-30 ENCOUNTER — Ambulatory Visit (INDEPENDENT_AMBULATORY_CARE_PROVIDER_SITE_OTHER): Payer: Medicare Other

## 2016-11-30 DIAGNOSIS — J4520 Mild intermittent asthma, uncomplicated: Secondary | ICD-10-CM

## 2016-12-01 MED ORDER — OMALIZUMAB 150 MG ~~LOC~~ SOLR
225.0000 mg | SUBCUTANEOUS | Status: DC
  Administered 2016-11-30: 225 mg via SUBCUTANEOUS

## 2016-12-01 NOTE — Progress Notes (Signed)
Documentation of medication administration of Xolair and charges have been completed by Lindsay Lemons, CMA based on the Xolair documentation sheet completed by Tammy Olsson.  

## 2016-12-05 ENCOUNTER — Telehealth: Payer: Self-pay | Admitting: Internal Medicine

## 2016-12-05 NOTE — Telephone Encounter (Signed)
#   vials:2 Ordered date:12/05/16 Shipping Date:12/05/16 

## 2016-12-06 NOTE — Telephone Encounter (Signed)
#   Vials:2 Arrival Date:12/06/16 Lot #[id number]#:[id number] Exp Date:11/21

## 2016-12-29 ENCOUNTER — Ambulatory Visit (INDEPENDENT_AMBULATORY_CARE_PROVIDER_SITE_OTHER): Payer: Medicare Other

## 2016-12-29 DIAGNOSIS — J4540 Moderate persistent asthma, uncomplicated: Secondary | ICD-10-CM

## 2017-01-01 MED ORDER — OMALIZUMAB 150 MG ~~LOC~~ SOLR: 150.0000 mg | Freq: Once | SUBCUTANEOUS | Status: DC

## 2017-01-01 MED ORDER — OMALIZUMAB 150 MG ~~LOC~~ SOLR
225.0000 mg | Freq: Once | SUBCUTANEOUS | Status: AC
  Administered 2016-12-29: 225 mg via SUBCUTANEOUS

## 2017-01-01 NOTE — Progress Notes (Signed)
Mrs. Deborah Branch came in on 12/29/16 to receive a Xolair injection. The patient is given 225mg  every 14 days. Due to each vial equalling 150mg , 15 units  of medication was wasted.

## 2017-01-03 ENCOUNTER — Telehealth: Payer: Self-pay | Admitting: Internal Medicine

## 2017-01-04 NOTE — Telephone Encounter (Signed)
#   vials:2 Ordered date:01/03/17 Shipping Date:01/03/17 Forgot to put in epic.

## 2017-01-04 NOTE — Telephone Encounter (Signed)
#   Vials:2 Arrival Date:01/03/17 Lot #[id number]#:[id number] Exp Date:12/21

## 2017-01-29 ENCOUNTER — Ambulatory Visit (INDEPENDENT_AMBULATORY_CARE_PROVIDER_SITE_OTHER): Payer: Medicare Other

## 2017-01-29 DIAGNOSIS — J4540 Moderate persistent asthma, uncomplicated: Secondary | ICD-10-CM | POA: Diagnosis not present

## 2017-01-30 MED ORDER — OMALIZUMAB 150 MG ~~LOC~~ SOLR
225.0000 mg | SUBCUTANEOUS | Status: DC
  Administered 2017-01-29: 225 mg via SUBCUTANEOUS

## 2017-01-30 NOTE — Progress Notes (Signed)
Xolair injection documentation and charges entered by Dorethea ClanAshley Aum Caggiano, RMA, based on injection sheet filled out by Dimas Millinammy Tribby per office protocol.   patient came in on 01/29/2017 to receive a Xolair injection. The patient is given 225mg  every 14 days. Due to each vial equalling 150mg , 75mg  of medication was wasted.

## 2017-02-22 ENCOUNTER — Telehealth: Payer: Self-pay | Admitting: Internal Medicine

## 2017-02-22 NOTE — Telephone Encounter (Signed)
#   vials:2 Ordered date:02/22/17 Shipping Date:02/22/17

## 2017-02-23 NOTE — Telephone Encounter (Signed)
#   Vials:2 Arrival Date:02/23/17 Lot #[id number]#:[id number] Exp Date:07/2020

## 2017-02-27 ENCOUNTER — Ambulatory Visit (INDEPENDENT_AMBULATORY_CARE_PROVIDER_SITE_OTHER): Payer: Medicare Other

## 2017-02-27 DIAGNOSIS — J4540 Moderate persistent asthma, uncomplicated: Secondary | ICD-10-CM | POA: Diagnosis not present

## 2017-02-28 ENCOUNTER — Ambulatory Visit: Payer: Medicare Other

## 2017-02-28 MED ORDER — OMALIZUMAB 150 MG ~~LOC~~ SOLR
225.0000 mg | SUBCUTANEOUS | Status: DC
  Administered 2017-02-28: 225 mg via SUBCUTANEOUS

## 2017-02-28 NOTE — Progress Notes (Signed)
Deborah Branch came in on 02/27/2017 to receive a Xolair injection. The patient is given 225mg  every 14 days. Due to each vial equalling 150mg , 75mg  of medication was wasted.   Documentation of medication administration and charges of Xolair have been completed by Jaynee EaglesLindsay Lemons, CMA based on the Xolair documentation sheet completed by [facility] Balicki.

## 2017-03-05 ENCOUNTER — Telehealth: Payer: Self-pay | Admitting: Internal Medicine

## 2017-03-05 NOTE — Telephone Encounter (Signed)
#   vials:2 Ordered date:03/05/17 Shipping Date:03/05/17 

## 2017-03-06 NOTE — Telephone Encounter (Signed)
#   Vials:2 Arrival Date:03/06/17 Lot #:[id number] Exp Date:07/2020  

## 2017-03-19 ENCOUNTER — Encounter: Payer: Self-pay | Admitting: Internal Medicine

## 2017-03-19 ENCOUNTER — Ambulatory Visit (INDEPENDENT_AMBULATORY_CARE_PROVIDER_SITE_OTHER): Payer: Medicare Other | Admitting: Internal Medicine

## 2017-03-19 DIAGNOSIS — Z23 Encounter for immunization: Secondary | ICD-10-CM | POA: Diagnosis not present

## 2017-03-19 DIAGNOSIS — J4521 Mild intermittent asthma with (acute) exacerbation: Secondary | ICD-10-CM

## 2017-03-19 MED ORDER — FLUTICASONE FUROATE-VILANTEROL 200-25 MCG/INH IN AEPB: 1.0000 | INHALATION_SPRAY | Freq: Every day | RESPIRATORY_TRACT | 0 refills | Status: DC

## 2017-03-19 MED ORDER — IPRATROPIUM-ALBUTEROL 0.5-2.5 (3) MG/3ML IN SOLN: 3.0000 mL | Freq: Four times a day (QID) | RESPIRATORY_TRACT | 12 refills | Status: DC | PRN

## 2017-03-19 MED ORDER — FLUTICASONE FUROATE-VILANTEROL 100-25 MCG/INH IN AEPB: INHALATION_SPRAY | RESPIRATORY_TRACT | 12 refills | Status: DC

## 2017-03-19 MED ORDER — DOXYCYCLINE HYCLATE 100 MG PO TABS: ORAL_TABLET | ORAL | 0 refills | Status: DC

## 2017-03-19 NOTE — Progress Notes (Signed)
Patient ID: Deborah Branch, female    DOB: 1935-03-28, 82 y.o.   MRN: [id number]  HPI  female never smoker followed for chronic asthma (Xolair), allergic rhinosinusitis, complicated by GERD Office Spirometry 08/26/15- --------------------------------------------------------------------------------------------  03/17/2016-82 year old female never smoker followed for chronic asthma (Xolair), allergic rhinosinusitis, complicated by GERD FOLLOWS FOR: Pt states that her breathing has been doing well. Tolerating Xolair injection - no new complaints. Wants flu vaccine today. Needs new nebulizer machine ([facility]) Occasional mild rhinorrhea managed with zyrtec Has done very well continuing Xolair once monthly with no routine need for maintenance or rescue inhalers. Nebulizer machine is broken and she would like to have a working one available for this winter. We discussed stopping or stretching out the interval between Xolair doses. She was concerned that she wasn't stable enough for that yet.  03/19/17- 82 year old female never smoker followed for chronic asthma (Xolair), allergic rhinosinusitis, complicated by GERD FOLLOWS FOR: Pt continues on Xolair; Pt has noticed cough and wheezing past 2 weeks. Pt has used nebulizer treatments but would like to have something to help with cough. Pt started using Duoneb and this works better than albuterol.  Resumed Dulera 100. Increased wheeze, cough and some yellow sputum over the last 2 weeks without fever or sore throat. `  Review of Systems- see HPI  + = positive Constitutional:   No-   weight loss, night sweats, fevers, chills, fatigue, lassitude. HEENT:   No-  headaches, difficulty swallowing, tooth/dental problems, +sore throat,       No-  sneezing, itching, ear ache, +nasal congestion, + post nasal drip,  CV:  No-   chest pain, orthopnea, PND, swelling in lower extremities, anasarca, dizziness, palpitations Resp: no-shortness of breath with exertion or at  rest.              +  productive cough,   non-productive cough,  No-  coughing up of blood.            + change in color of mucus.  +.   Skin: No-   rash or lesions. GI:  No-   heartburn, indigestion, abdominal pain, nausea, vomiting,  GU: MS:  No-   joint pain or swelling. Neuro- grossly normal  Psych:  No- change in mood or affect. No depression or anxiety.  No memory loss.     Objective:   Physical Exam General- Alert, Oriented, Affect-appropriate, Distress- none acute  Medium build Skin- rash-none, lesions- none, excoriation- none Lymphadenopathy- none Head- atraumatic            Eyes- Gross vision intact, PERRLA, conjunctivae clear secretions            Ears- Hearing, canals normal            Nose- No sniffing, no-Septal dev, mucus, polyps, erosion, perforation             Throat- Mallampati III-IV , mucosa clear- not red , drainage- none, tonsils- atrophic Neck- flexible , trachea midline, no stridor , thyroid nl, carotid no bruit Chest - symmetrical excursion , unlabored           Heart/CV- RRR , no murmur , no gallop  , no rub, nl s1 s2                           - JVD- none , edema- none, stasis changes- none, varices- none  Lung-  Wheeze+slight , dullness-none, rub- none, cough -none           Chest wall-  Abd-  Br/ Gen/ Rectal- Not done, not indicated Extrem- cyanosis- none, clubbing, none, atrophy- none, strength- nl Neuro- grossly intact to observation

## 2017-03-19 NOTE — Patient Instructions (Signed)
Script sent for doxycycline antibiotic  Script sent for ipratropium/albuterol (DuoNeb) nebulizer solution    Use 1 every 6 hours if needed  Sample and printed script for Breo Ellipta inhaler-   Inhale 1 puff, then rinse mouth, once daily    Try this instead of Dulera, suing it every day while needed as a maintenance medicine.     You can still use th nebulizer, or your albuterol rescue inhaler, if needed  Flu vax- senior

## 2017-03-28 ENCOUNTER — Ambulatory Visit (INDEPENDENT_AMBULATORY_CARE_PROVIDER_SITE_OTHER): Payer: Medicare Other

## 2017-03-28 DIAGNOSIS — J4540 Moderate persistent asthma, uncomplicated: Secondary | ICD-10-CM

## 2017-03-29 MED ORDER — OMALIZUMAB 150 MG ~~LOC~~ SOLR
225.0000 mg | SUBCUTANEOUS | Status: DC
  Administered 2017-03-28: 225 mg via SUBCUTANEOUS

## 2017-03-29 NOTE — Progress Notes (Signed)
Xolair injection documentation and charges entered by Dorethea Clan, RMA, based on injection sheet filled out by Deborah Branch per office protocol.   patient came in on 03/28/17 to receive a Xolair injection. The patient is given  every 14 days. Due to each vial equalling ,  of medication was wasted.

## 2017-04-08 NOTE — Assessment & Plan Note (Signed)
Previously moderate to severe chronic asthma has been substantially improved and she continues Xolair. Current acute exacerbation probably viral or related to season change. Plan-Try changing Dulera to Breo 100. Nebulizer machine DuoNeb, flu vaccine, doxycycline

## 2017-04-19 ENCOUNTER — Telehealth: Payer: Self-pay | Admitting: Internal Medicine

## 2017-04-19 NOTE — Telephone Encounter (Signed)
#   vials:2 Ordered date:04/19/17 Shipping Date:04/19/17 

## 2017-04-20 NOTE — Telephone Encounter (Signed)
#   Vials:2 Arrival Date:04/20/17 Lot #[id number]#:[id number] Exp Date:08/2020

## 2017-04-27 ENCOUNTER — Ambulatory Visit (INDEPENDENT_AMBULATORY_CARE_PROVIDER_SITE_OTHER): Payer: Medicare Other

## 2017-04-27 DIAGNOSIS — J4521 Mild intermittent asthma with (acute) exacerbation: Secondary | ICD-10-CM

## 2017-05-02 MED ORDER — OMALIZUMAB 150 MG ~~LOC~~ SOLR
225.0000 mg | Freq: Once | SUBCUTANEOUS | Status: AC
  Administered 2017-04-27: 225 mg via SUBCUTANEOUS

## 2017-05-02 NOTE — Progress Notes (Signed)
Mrs. Deborah Branch came in on 04/27/17 to receive a Xolair injection. The patient is given 225mg  every 14 days. Due to each vial equalling 150mg , 15mg  of medication was wasted.

## 2017-05-21 ENCOUNTER — Telehealth: Payer: Self-pay | Admitting: Internal Medicine

## 2017-05-21 NOTE — Telephone Encounter (Signed)
#   vials:2 Ordered date:05/21/17 Shipping Date:05/21/17 

## 2017-05-23 NOTE — Telephone Encounter (Signed)
#   Vials:2 Arrival Date:05/23/17 Shipment was a day late due to the holiday. Lot #:[id number]#:[id number] Exp Date:08/2020

## 2017-05-28 ENCOUNTER — Ambulatory Visit (INDEPENDENT_AMBULATORY_CARE_PROVIDER_SITE_OTHER): Payer: Medicare Other

## 2017-05-28 DIAGNOSIS — J4521 Mild intermittent asthma with (acute) exacerbation: Secondary | ICD-10-CM

## 2017-05-29 MED ORDER — OMALIZUMAB 150 MG ~~LOC~~ SOLR
225.0000 mg | Freq: Once | SUBCUTANEOUS | Status: AC
  Administered 2017-05-28: 225 mg via SUBCUTANEOUS

## 2017-05-29 NOTE — Progress Notes (Signed)
Mrs. Deborah Branch came in on 05/28/17 to receive a Xolair injection. The patient is given 225mg  every 14 days. Due to each vial equalling 150mg , 15mg  of medication was wasted.

## 2017-06-20 ENCOUNTER — Telehealth: Payer: Self-pay | Admitting: Internal Medicine

## 2017-06-20 NOTE — Telephone Encounter (Addendum)
#   Pre-Filled Syringes:2 1) 150mg  syringe 1) 75mg  syringe Ordered date:06/20/17 Shipping Date:06/20/17  shipment did not come in. No invoice.

## 2017-06-27 ENCOUNTER — Ambulatory Visit (INDEPENDENT_AMBULATORY_CARE_PROVIDER_SITE_OTHER): Payer: Medicare Other

## 2017-06-27 DIAGNOSIS — J4540 Moderate persistent asthma, uncomplicated: Secondary | ICD-10-CM | POA: Diagnosis not present

## 2017-07-03 MED ORDER — OMALIZUMAB 150 MG ~~LOC~~ SOLR
225.0000 mg | SUBCUTANEOUS | Status: DC
  Administered 2017-06-27: 225 mg via SUBCUTANEOUS

## 2017-07-03 NOTE — Progress Notes (Signed)
Documentation of medication administration and charges of Xolair have been completed by Marylynn Rigdon, CMA based on the Xolair documentation sheet completed by Tammy Ciesla.  

## 2017-07-16 ENCOUNTER — Telehealth: Payer: Self-pay | Admitting: Internal Medicine

## 2017-07-16 MED ORDER — DOXYCYCLINE HYCLATE 100 MG PO TABS: 100.0000 mg | ORAL_TABLET | Freq: Two times a day (BID) | ORAL | 0 refills | Status: DC

## 2017-07-16 NOTE — Telephone Encounter (Signed)
Spoke with patient. She stated that she started coughing and not feeling well yesterday. Since then, she has developed a productive cough with yellow phlegm. She also has a slight headache. Denied any symptoms of SOB or chest pain.   She did wish to mention that she has 2 days left worth of a prednisone taper. 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   She wants to know if CY could call in a medication to make her feel better.   [facility] [place]   CY, please advise. Thanks!

## 2017-07-16 NOTE — Telephone Encounter (Signed)
Spoke with patient. Advised her if CY's recs. Medication has been called into pharmacy.

## 2017-07-16 NOTE — Telephone Encounter (Signed)
Offer doxycycline 100 mg, # 14, 1 twice daily 

## 2017-07-17 NOTE — Telephone Encounter (Signed)
#   vials:2 Ordered date:07/17/2017 Shipping Date:07/17/2017

## 2017-07-18 NOTE — Telephone Encounter (Signed)
#   Vials:2 Arrival Date:07/18/17 Lot #[id number]#:[id number] Exp Date:09/2020

## 2017-07-20 ENCOUNTER — Telehealth: Payer: Self-pay | Admitting: Internal Medicine

## 2017-07-20 MED ORDER — PREDNISONE 10 MG PO TABS: ORAL_TABLET | ORAL | 0 refills | Status: DC

## 2017-07-20 NOTE — Telephone Encounter (Signed)
ATC patient twice. Received a fax signal the first time. Received a busy signal the second time. Will call back later.

## 2017-07-20 NOTE — Telephone Encounter (Signed)
Patient returning call - she can be reached at [phone] -pr

## 2017-07-20 NOTE — Telephone Encounter (Signed)
Spoke with patient. She called in on 1/21 due to having a productive cough with yellow phlegm, chest tightness and increased SOB. CY called in doxy 100mg  twice daily for 7 days. Patient states that she is not feeling any better and the coughing is getting worse.   She wants to know if something else could be called in for her.   She wishes to use [facility] in [place].   MW, please advise since CY is out today. Thanks!

## 2017-07-20 NOTE — Telephone Encounter (Signed)
Called pt letting her know we were sending a Rx of prednisone to her pharmacy. Pt expressed understanding.  Told pt if she did become worse that recommendations were for her to go to the ER. Pt stated understanding.  Nothing further needed at this current time.

## 2017-07-20 NOTE — Telephone Encounter (Signed)
Prednisone 10 mg take  4 each am x 2 days,   2 each am x 2 days,  1 each am x 2 days and stop To er over w/e if worse  May need off breo as DPI's may aggravate cough more than alternative rx but defer that to her primary pulmonary provider

## 2017-07-26 ENCOUNTER — Telehealth: Payer: Self-pay | Admitting: Internal Medicine

## 2017-07-26 NOTE — Telephone Encounter (Signed)
Called pt letting her know the information relayed by CY.  Pt expressed understanding. Nothing further needed at this current time.

## 2017-07-26 NOTE — Telephone Encounter (Signed)
As long as she isn't actively running a fever or feeling acutely ill, it should be ok for her to get her Xolair injection.

## 2017-07-26 NOTE — Telephone Encounter (Signed)
Called Deborah Branch who stated she has been coughing x1 week with clear to yellow mucus.  Deborah Branch stated she has also had some tightness in chest.  Deborah Branch states she took her last dose of prednisone that was recently prescribed by CY today and states she is much better than she originally did.  Since Deborah Branch is still coughing up clear to yellow mucus, Deborah Branch is wanting to know if it will be okay for her to get her xolair injection tomorrow, 07/27/17 or if she needs to hold off on that.  Dr. Maple HudsonYoung, please advise on this for Deborah Branch.  Thanks!  Allergies  Allergen Reactions  . Clarithromycin     REACTION: itching  . Penicillins     REACTION: hives     Current Outpatient Medications:  .  albuterol (PROAIR HFA) 108 (90 BASE) MCG/ACT inhaler, Inhale 2 puffs into the lungs every 4 (four) hours as needed for wheezing or shortness of breath., Disp: 1 Inhaler, Rfl: prn .  aspirin 81 MG tablet, Take 81 mg by mouth daily.  , Disp: , Rfl:  .  Calcium Carbonate (CALTRATE 600) 1500 MG TABS, Take 1 tablet by mouth daily.  , Disp: , Rfl:  .  Cholecalciferol (VITAMIN D) 2000 UNITS CAPS, Take 1 capsule by mouth daily.  , Disp: , Rfl:  .  doxycycline (VIBRA-TABS) 100 MG tablet, 2 today then one daily, Disp: 8 tablet, Rfl: 0 .  doxycycline (VIBRA-TABS) 100 MG tablet, Take 1 tablet (100 mg total) by mouth 2 (two) times daily., Disp: 14 tablet, Rfl: 0 .  fluticasone furoate-vilanterol (BREO ELLIPTA) 100-25 MCG/INH AEPB, Inhale 1 puff, then rinse mouth, once daily, Disp: 60 each, Rfl: 12 .  fluticasone furoate-vilanterol (BREO ELLIPTA) 200-25 MCG/INH AEPB, Inhale 1 puff into the lungs daily., Disp: 1 each, Rfl: 0 .  ipratropium-albuterol (DUONEB) 0.5-2.5 (3) MG/3ML SOLN, Take 3 mLs by nebulization every 6 (six) hours as needed., Disp: 225 mL, Rfl: 12 .  losartan-hydrochlorothiazide (HYZAAR) 50-12.5 MG per tablet, Take 1 tablet by mouth daily., Disp: , Rfl: 1 .  Nebulizers (COMPRESSOR/NEBULIZER) MISC, Use every 6 hours for wheezing, Disp: 1 each,  Rfl: 0 .  omeprazole (PRILOSEC) 20 MG capsule, Take 20 mg by mouth daily.  , Disp: , Rfl:  .  predniSONE (DELTASONE) 10 MG tablet, Take 40mg x2days,20mg x2days,10mg x2days,then stop, Disp: 14 tablet, Rfl: 0 .  simvastatin (ZOCOR) 20 MG tablet, Take 20 mg by mouth at bedtime.  , Disp: , Rfl:  .  vitamin B-12 (CYANOCOBALAMIN) 1000 MCG tablet, Take 1,000 mcg by mouth daily., Disp: , Rfl:   Current Facility-Administered Medications:  .  omalizumab (XOLAIR) injection 225 mg, 225 mg, Subcutaneous, Q14 Days, Young, Clinton D, MD, 225 mg at 06/27/17 1552

## 2017-07-27 ENCOUNTER — Ambulatory Visit (INDEPENDENT_AMBULATORY_CARE_PROVIDER_SITE_OTHER): Payer: Medicare Other

## 2017-07-27 DIAGNOSIS — J4540 Moderate persistent asthma, uncomplicated: Secondary | ICD-10-CM | POA: Diagnosis not present

## 2017-07-30 MED ORDER — OMALIZUMAB 150 MG ~~LOC~~ SOLR
225.0000 mg | SUBCUTANEOUS | Status: DC
  Administered 2017-07-27: 225 mg via SUBCUTANEOUS

## 2017-07-30 NOTE — Progress Notes (Addendum)
Xolair injection documentation and charges entered by Dorethea ClanAshley Caulfield, RMA, based on injection sheet filled out by Reynaldo MiniumKatie Welchel per office protocol.   patient came in on 07/27/17 to receive a Xolair injection. The patient is given 225mg  every 14 days. Due to each vial equalling 150mg , 75mg  of medication was wasted.

## 2017-08-02 ENCOUNTER — Other Ambulatory Visit: Payer: Self-pay | Admitting: Internal Medicine

## 2017-08-02 ENCOUNTER — Telehealth: Payer: Self-pay | Admitting: Internal Medicine

## 2017-08-02 MED ORDER — CEFDINIR 300 MG PO CAPS: 300.0000 mg | ORAL_CAPSULE | Freq: Two times a day (BID) | ORAL | 0 refills | Status: DC

## 2017-08-02 NOTE — Telephone Encounter (Signed)
Spoke with patient. She is aware of CY's recs. Will go ahead and send in omnicef 300mg .   Nothing else is needed at time of call.

## 2017-08-02 NOTE — Telephone Encounter (Signed)
She has had doxycycline and prednisone. Did either help?  She has listed allergy to prednisone. I suggest we try her with omnicef. This is a cephalosporin. A few people with penicillin allergy cross react with cephalosporins, but most tolerate it fine.   Order omnicef 300 mg, # 14, 1 twice daily

## 2017-08-02 NOTE — Telephone Encounter (Signed)
Spoke with patient. She stated that she is still feeling bad. She has a productive cough with thick yellow mucus in the mornings, but the mucus turns clear during the day. Increased wheezing. Denies any body aches or fever.   Wishes to use [facility] in [place].   CY, please advise. Thanks!

## 2017-08-09 ENCOUNTER — Telehealth: Payer: Self-pay | Admitting: Internal Medicine

## 2017-08-09 NOTE — Telephone Encounter (Signed)
#   vials:2 Ordered date:08/09/2017 Shipping Date:08/09/2017

## 2017-08-10 NOTE — Telephone Encounter (Signed)
#   PFS:(1) 150mg  (1) 75mg   Arrival Date:08/10/17 Lot #[id number]#:[id number] 150mg  [id number] 75mg  Exp Date:05/2018

## 2017-08-27 ENCOUNTER — Ambulatory Visit (INDEPENDENT_AMBULATORY_CARE_PROVIDER_SITE_OTHER): Payer: Medicare Other

## 2017-08-27 DIAGNOSIS — J4521 Mild intermittent asthma with (acute) exacerbation: Secondary | ICD-10-CM

## 2017-08-28 MED ORDER — OMALIZUMAB 150 MG ~~LOC~~ SOLR
225.0000 mg | SUBCUTANEOUS | Status: DC
  Administered 2017-08-27: 225 mg via SUBCUTANEOUS

## 2017-08-28 NOTE — Progress Notes (Signed)
Documentation of medication administration and charges of Xolair have been completed by Dayona Shaheen, CMA based on the Xolair documentation sheet completed by Tammy Osika.  

## 2017-09-18 ENCOUNTER — Telehealth: Payer: Self-pay | Admitting: Internal Medicine

## 2017-09-18 NOTE — Telephone Encounter (Signed)
Prefilled Syringe: #150mg  1  #75mg  1 Ordered Date: 09/18/17 Shipping Date: 09/18/17

## 2017-09-20 NOTE — Telephone Encounter (Signed)
Prefilled Syringes: # 150mg  1  #75mg  1 Arrival Date: 09/20/2017 Lot #: 150mg  [id number]       75mg : [id number] Exp Date: 150mg  05/2018   75mg  05/2018 Ok to put in today per MillingtonKatie.

## 2017-09-25 ENCOUNTER — Ambulatory Visit (INDEPENDENT_AMBULATORY_CARE_PROVIDER_SITE_OTHER): Payer: Medicare Other

## 2017-09-25 DIAGNOSIS — J4540 Moderate persistent asthma, uncomplicated: Secondary | ICD-10-CM | POA: Diagnosis not present

## 2017-09-26 MED ORDER — OMALIZUMAB 150 MG ~~LOC~~ SOLR
225.0000 mg | SUBCUTANEOUS | Status: DC
  Administered 2017-09-25: 225 mg via SUBCUTANEOUS

## 2017-10-03 ENCOUNTER — Telehealth: Payer: Self-pay

## 2017-10-03 NOTE — Telephone Encounter (Signed)
#   vials:1-150mg  prefilled syringe, 1-75mg  prefilled syringe Ordered date:10/03/17  Shipping Date:10/04/17

## 2017-10-04 NOTE — Telephone Encounter (Signed)
Prefilled Syringes: # 150mgLzVNGnitJVe$  1  #75mg  1 Arrival Date: 4.11.19 Lot #: 150mg  [id number]      75mg  [id number] Exp Date: 150mg  12.31.19   75mg  12.31.19

## 2017-10-24 ENCOUNTER — Ambulatory Visit (INDEPENDENT_AMBULATORY_CARE_PROVIDER_SITE_OTHER): Payer: Medicare Other

## 2017-10-24 DIAGNOSIS — J4520 Mild intermittent asthma, uncomplicated: Secondary | ICD-10-CM | POA: Diagnosis not present

## 2017-10-26 MED ORDER — OMALIZUMAB 150 MG ~~LOC~~ SOLR
225.0000 mg | Freq: Once | SUBCUTANEOUS | Status: AC
  Administered 2017-10-24: 225 mg via SUBCUTANEOUS

## 2017-10-26 MED ORDER — OMALIZUMAB 150 MG ~~LOC~~ SOLR: 150.0000 mg | Freq: Once | SUBCUTANEOUS | Status: DC

## 2017-11-01 ENCOUNTER — Telehealth: Payer: Self-pay | Admitting: Internal Medicine

## 2017-11-01 NOTE — Telephone Encounter (Signed)
Prefilled Syringe: #150mg  1  #75mg  1 Ordered Date: 11/01/17 Shipping Date: 11/01/17

## 2017-11-02 NOTE — Telephone Encounter (Signed)
Prefilled Syringes: #  1  #75mg  1 Arrival Date: 11/02/17 Lot #:  [id number]       [id number] Exp Date:  12/19    12/19

## 2017-11-26 ENCOUNTER — Ambulatory Visit (INDEPENDENT_AMBULATORY_CARE_PROVIDER_SITE_OTHER): Payer: Medicare Other

## 2017-11-26 DIAGNOSIS — J4540 Moderate persistent asthma, uncomplicated: Secondary | ICD-10-CM | POA: Diagnosis not present

## 2017-11-27 MED ORDER — OMALIZUMAB 150 MG ~~LOC~~ SOLR
225.0000 mg | Freq: Once | SUBCUTANEOUS | Status: AC
  Administered 2017-11-26: 225 mg via SUBCUTANEOUS

## 2017-12-20 ENCOUNTER — Telehealth: Payer: Self-pay | Admitting: Internal Medicine

## 2017-12-20 NOTE — Telephone Encounter (Signed)
Prefilled Syringes: # 150mg  1  #75mg  1 Arrival Date: 12/20/17 Lot #: 150mg  [id number]      75mg [id number] Exp Date: 150mg  06/2018   75mg   07/2018

## 2017-12-26 ENCOUNTER — Ambulatory Visit (INDEPENDENT_AMBULATORY_CARE_PROVIDER_SITE_OTHER): Payer: Medicare Other

## 2017-12-26 DIAGNOSIS — J4540 Moderate persistent asthma, uncomplicated: Secondary | ICD-10-CM | POA: Diagnosis not present

## 2017-12-28 MED ORDER — OMALIZUMAB 150 MG ~~LOC~~ SOLR
225.0000 mg | Freq: Once | SUBCUTANEOUS | Status: AC
  Administered 2017-12-26: 225 mg via SUBCUTANEOUS

## 2018-01-15 ENCOUNTER — Telehealth: Payer: Self-pay | Admitting: Internal Medicine

## 2018-01-16 NOTE — Telephone Encounter (Signed)
Prefilled Syringe: #150mg  1  #75mg  1 is on back order. I'm hoping it will be here 01/18/18. Ordered Date: 01/15/18 150mg  only  Shipping Date: 01/15/18

## 2018-01-16 NOTE — Telephone Encounter (Signed)
Prefilled Syringes: # 150mg  1  #75mg  0 it's back ordered Arrival Date: 01/16/18 150mg  Only Lot #: 150mg  [id number]      75mg  0 Exp Date: 150mg  06/2018   75mg  0 I will leave encounter open to record del of 75mg .

## 2018-01-18 NOTE — Telephone Encounter (Signed)
Prefilled Syringes: # 150mg  0  #75mg  1 Arrival Date: 01/18/18 Lot #: 150mg  0     75mg  [id number] Exp Date: 150mg  0   75mg  07/2018

## 2018-01-24 ENCOUNTER — Ambulatory Visit (INDEPENDENT_AMBULATORY_CARE_PROVIDER_SITE_OTHER): Payer: Medicare Other

## 2018-01-24 DIAGNOSIS — J4521 Mild intermittent asthma with (acute) exacerbation: Secondary | ICD-10-CM

## 2018-02-01 ENCOUNTER — Emergency Department (HOSPITAL_COMMUNITY): Payer: Medicare Other

## 2018-02-01 ENCOUNTER — Emergency Department (HOSPITAL_COMMUNITY)
Admission: EM | Admit: 2018-02-01 | Discharge: 2018-02-01 | Disposition: A | Discharge status: Home / Self Care | Payer: Medicare Other | Attending: Emergency Medicine | Admitting: Emergency Medicine

## 2018-02-01 ENCOUNTER — Other Ambulatory Visit: Payer: Self-pay

## 2018-02-01 ENCOUNTER — Encounter (HOSPITAL_COMMUNITY): Payer: Self-pay | Admitting: Emergency Medicine

## 2018-02-01 DIAGNOSIS — I10 Essential (primary) hypertension: Secondary | ICD-10-CM | POA: Diagnosis not present

## 2018-02-01 DIAGNOSIS — J45909 Unspecified asthma, uncomplicated: Secondary | ICD-10-CM | POA: Diagnosis not present

## 2018-02-01 DIAGNOSIS — R079 Chest pain, unspecified: Secondary | ICD-10-CM | POA: Diagnosis not present

## 2018-02-01 DIAGNOSIS — Z79899 Other long term (current) drug therapy: Secondary | ICD-10-CM | POA: Insufficient documentation

## 2018-02-01 DIAGNOSIS — Z7982 Long term (current) use of aspirin: Secondary | ICD-10-CM | POA: Diagnosis not present

## 2018-02-01 LAB — BASIC METABOLIC PANEL
ANION GAP: 9 (ref 5–15)
BUN: 17 mg/dL (ref 8–23)
CO2: 30 mmol/L (ref 22–32)
Calcium: 9.7 mg/dL (ref 8.9–10.3)
Chloride: 102 mmol/L (ref 98–111)
Creatinine, Ser: 1 mg/dL (ref 0.44–1.00)
GFR calc Af Amer: 60 mL/min — ABNORMAL LOW (ref >60)
GFR, EST NON AFRICAN AMERICAN: 51 mL/min — AB (ref >60)
GLUCOSE: 120 mg/dL — AB (ref 70–99)
POTASSIUM: 3.7 mmol/L (ref 3.5–5.1)
SODIUM: 141 mmol/L (ref 135–145)

## 2018-02-01 LAB — CBC
HEMATOCRIT: 41.9 % (ref 36.0–46.0)
HEMOGLOBIN: 14.1 g/dL (ref 12.0–15.0)
MCH: 31.5 pg (ref 26.0–34.0)
MCHC: 33.7 g/dL (ref 30.0–36.0)
MCV: 93.5 fL (ref 78.0–100.0)
Platelets: 231 10*3/uL (ref 150–400)
RBC: 4.48 MIL/uL (ref 3.87–5.11)
RDW: 13.6 % (ref 11.5–15.5)
WBC: 12 10*3/uL — ABNORMAL HIGH (ref 4.0–10.5)

## 2018-02-01 LAB — TROPONIN I: Troponin I: <0.03 ng/mL (ref <0.03)

## 2018-02-01 MED ORDER — ALUM & MAG HYDROXIDE-SIMETH 200-200-20 MG/5ML PO SUSP
15.0000 mL | Freq: Once | ORAL | Status: AC
  Administered 2018-02-01: 15 mL via ORAL
  Filled 2018-02-01: qty 30

## 2018-02-01 MED ORDER — OMALIZUMAB 150 MG ~~LOC~~ SOLR
225.0000 mg | SUBCUTANEOUS | Status: DC
  Administered 2018-01-24: 225 mg via SUBCUTANEOUS

## 2018-02-01 MED ORDER — LIDOCAINE VISCOUS HCL 2 % MT SOLN
15.0000 mL | Freq: Once | OROMUCOSAL | Status: AC
  Administered 2018-02-01: 15 mL via OROMUCOSAL
  Filled 2018-02-01: qty 15

## 2018-02-01 NOTE — Discharge Instructions (Addendum)
You were evaluated in the emergency department for chest pain.  You had blood work chest x-ray and EKG that did not show any obvious cause of your pain.  Will be important for you to follow-up in the emergency department.  Please return if any concerns.

## 2018-02-01 NOTE — Progress Notes (Signed)
Documentation of medication administration and charges of Xolair have been completed by Lindsay Lemons, CMA based on the Xolair documentation sheet completed by Tammy Knutson.  

## 2018-02-01 NOTE — ED Provider Notes (Signed)
[facility] EMERGENCY DEPARTMENT Provider Note   CSN: [id number] Arrival date & time: 02/01/18  1119     History   Chief Complaint Chief Complaint  Patient presents with  . Chest Pain    HPI [name] is a 83 y.o. female.  She is brought in by her family for evaluation of chest tightness.  She states it felt tight yesterday evening and she would notice some difficulty swallowing.  It was there stronger today and she elected to come in for evaluation.  She states it is improved now but has not completely gone away.  She felt if she were to try to eat something that he would have difficulty going down.  She has no prior history of any cardiac disease.  She denies shortness of breath.  She is felt lightheaded before.  She states she just last week had a dental extraction and bone graft and she is still recovering from that.  She admits to a lot of stress at home and caring for her invalid husband.  Her son is here also and agrees that she is under a lot of stress and cries easily.  The history is provided by the patient and a relative.  Chest Pain   This is a new problem. The current episode started yesterday. The problem occurs constantly. The problem has been gradually improving. Associated with: swallowing. The pain is present in the substernal region. The pain is moderate. The quality of the pain is described as pressure-like. The pain does not radiate. Pertinent negatives include no abdominal pain, no back pain, no cough, no diaphoresis, no fever, no headaches, no hemoptysis, no leg pain, no lower extremity edema, no palpitations, no shortness of breath, no sputum production, no syncope and no vomiting. She has tried rest for the symptoms. The treatment provided mild relief. Risk factors include being elderly.  Her past medical history is significant for hyperlipidemia and hypertension.  Pertinent negatives for past medical history include no seizures.    Past Medical History:    Diagnosis Date  . Allergic asthma   . GERD (gastroesophageal reflux disease)   . Hyperlipidemia   . Hypertension   . Osteoporosis     Patient Active Problem List   Diagnosis Date Noted  . Asthma, mild intermittent 10/01/2007  . Acute recurrent maxillary sinusitis 07/25/2007  . Seasonal and perennial allergic rhinitis 07/25/2007  . ESOPHAGEAL REFLUX 07/25/2007  . ECZEMA 07/25/2007  . OSTEOPOROSIS 07/25/2007    Past Surgical History:  Procedure Laterality Date  . CATARACT EXTRACTION, BILATERAL  2012     OB History   None      Home Medications    Prior to Admission medications   Medication Sig Start Date End Date Taking? Authorizing Provider  aspirin 81 MG tablet Take 81 mg by mouth daily.      [provider]  Calcium Carbonate (CALTRATE 600) 1500 MG TABS Take 1 tablet by mouth daily.      [provider]  cefdinir (OMNICEF) 300 MG capsule Take 1 capsule (300 mg total) by mouth 2 (two) times daily. 08/02/17   Jetty Duhamel D, MD  Cholecalciferol (VITAMIN D) 2000 UNITS CAPS Take 1 capsule by mouth daily.      [provider]  doxycycline (VIBRA-TABS) 100 MG tablet 2 today then one daily 03/19/17   Jetty Duhamel D, MD  doxycycline (VIBRA-TABS) 100 MG tablet Take 1 tablet (100 mg total) by mouth 2 (two) times daily. 07/16/17  Jetty DuhamelYoung, Clinton D, MD  fluticasone furoate-vilanterol (BREO ELLIPTA) 100-25 MCG/INH AEPB Inhale 1 puff, then rinse mouth, once daily 03/19/17   Jetty DuhamelYoung, Clinton D, MD  fluticasone furoate-vilanterol (BREO ELLIPTA) 200-25 MCG/INH AEPB Inhale 1 puff into the lungs daily. 03/19/17   Jetty DuhamelYoung, Clinton D, MD  ipratropium-albuterol (DUONEB) 0.5-2.5 (3) MG/3ML SOLN Take 3 mLs by nebulization every 6 (six) hours as needed. 03/19/17   Waymon BudgeYoung, Clinton D, MD  losartan-hydrochlorothiazide (HYZAAR) 50-12.5 MG per tablet Take 1 tablet by mouth daily. 02/02/15   [provider]  Nebulizers (COMPRESSOR/NEBULIZER) MISC Use every 6 hours for wheezing  03/17/16   Jetty DuhamelYoung, Clinton D, MD  omeprazole (PRILOSEC) 20 MG capsule Take 20 mg by mouth daily.      [provider]  predniSONE (DELTASONE) 10 MG tablet Take 40mg x2days,20mg x2days,10mg x2days,then stop 07/20/17   Nyoka CowdenWert, Michael B, MD  PROAIR HFA 108 [phone](90 Base) MCG/ACT inhaler INHALE TWO PUFFS BY MOUTH EVERY 4 HOURS AS NEEDED FOR WHEEZING AND SHORTNESS OF BREATH 08/02/17   Jetty DuhamelYoung, Clinton D, MD  simvastatin (ZOCOR) 20 MG tablet Take 20 mg by mouth at bedtime.      [provider]  vitamin B-12 (CYANOCOBALAMIN) 1000 MCG tablet Take 1,000 mcg by mouth daily.    [provider]    Family History Family History  Problem Relation Age of Onset  . Colon cancer Neg Hx   . Esophageal cancer Neg Hx   . Rectal cancer Neg Hx   . Stomach cancer Neg Hx     Social History Social History   Tobacco Use  . Smoking status: Never Smoker  . Smokeless tobacco: Never Used  Substance Use Topics  . Alcohol use: Yes    Comment: margarita every month  . Drug use: No     Allergies   Clarithromycin and Penicillins   Review of Systems Review of Systems  Constitutional: Negative for chills, diaphoresis and fever.  HENT: Negative for ear pain and sore throat.   Eyes: Negative for pain and visual disturbance.  Respiratory: Negative for cough, hemoptysis, sputum production and shortness of breath.   Cardiovascular: Positive for chest pain. Negative for palpitations and syncope.  Gastrointestinal: Negative for abdominal pain and vomiting.  Genitourinary: Negative for dysuria and hematuria.  Musculoskeletal: Negative for arthralgias and back pain.  Skin: Negative for color change and rash.  Neurological: Negative for seizures, syncope and headaches.  Psychiatric/Behavioral:       Stressed, crying  All other systems reviewed and are negative.    Physical Exam Updated Vital Signs BP (!) 153/72   Pulse 63   Temp 98 F (36.7 C) (Oral)   Resp 14   Wt 68 kg   SpO2 95%   BMI  29.29 kg/m   Physical Exam  Constitutional: She appears well-developed and well-nourished. No distress.  HENT:  Head: Normocephalic and atraumatic.  Eyes: Conjunctivae are normal.  Neck: Neck supple.  Cardiovascular: Normal rate, regular rhythm, intact distal pulses and normal pulses.  No murmur heard. Pulmonary/Chest: Effort normal and breath sounds normal. No respiratory distress.  Abdominal: Soft. There is no tenderness.  Musculoskeletal: Normal range of motion. She exhibits no edema.       Right lower leg: She exhibits no tenderness and no edema.       Left lower leg: She exhibits no tenderness and no edema.  Neurological: She is alert.  Skin: Skin is warm and dry. Capillary refill takes less than 2 seconds.  Psychiatric: She has a normal mood  and affect.  Nursing note and vitals reviewed.    ED Treatments / Results  Labs (all labs ordered are listed, but only abnormal results are displayed) Labs Reviewed  CBC - Abnormal; Notable for the following components:      Result Value   WBC 12.0 (*)    All other components within normal limits  BASIC METABOLIC PANEL  TROPONIN I    EKG EKG Interpretation  Date/Time:  Friday February 01 2018 11:22:59 EDT Ventricular Rate:  66 PR Interval:  194 QRS Duration: 80 QT Interval:  386 QTC Calculation: 404 R Axis:   72 Text Interpretation:  Normal sinus rhythm Nonspecific ST abnormality Abnormal ECG no prior available Confirmed by Meridee Score [phone]) on 02/01/2018 12:29:18 PM   Radiology Dg Chest 2 View  Result Date: 02/01/2018 CLINICAL DATA:  83 year old female with a history of chest tightness EXAM: CHEST - 2 VIEW COMPARISON:  09/10/2015 FINDINGS: Cardiomediastinal silhouette unchanged in size and contour. No evidence of central vascular congestion. No pneumothorax or pleural effusion. No confluent airspace disease. Coarsened interstitial markings. Degenerative changes without acute fracture IMPRESSION: Negative for acute  cardiopulmonary disease Electronically Signed   By: Gilmer Mor D.O.   On: 02/01/2018 12:07    Procedures Procedures (including critical care time)  Medications Ordered in ED Medications  alum & mag hydroxide-simeth (MAALOX/MYLANTA) 200-200-20 MG/5ML suspension 15 mL (15 mLs Oral Given 02/01/18 1337)  lidocaine (XYLOCAINE) 2 % viscous mouth solution 15 mL (15 mLs Mouth/Throat Given 02/01/18 1337)     Initial Impression / Assessment and Plan / ED Course  I have reviewed the triage vital signs and the nursing notes.  Pertinent labs & imaging results that were available during my care of the patient were reviewed by me and considered in my medical decision making (see chart for details).    Patient had some relief with gi cocktail. 2 trop negative. She wants to go home and I think this is reasonable. Has had cardiac testing in past with no known cad. Will followup with pcp and return if any problems.   Final Clinical Impressions(s) / ED Diagnoses   Final diagnoses:  Nonspecific chest pain    ED Discharge Orders    None       Terrilee Files, MD 02/03/18 [phone]

## 2018-02-01 NOTE — ED Notes (Signed)
Dr Butler in to assess 

## 2018-02-01 NOTE — ED Triage Notes (Signed)
Patient complaining of chest "tightness" since last night. States pain radiates into neck area. Also complains of light headedness.

## 2018-02-19 ENCOUNTER — Telehealth: Payer: Self-pay | Admitting: Internal Medicine

## 2018-02-19 NOTE — Telephone Encounter (Signed)
Prefilled Syringe: #150mg  1  #75mg  1 Ordered Date: 02/19/18 Shipping Date: 02/19/18

## 2018-02-20 NOTE — Telephone Encounter (Signed)
Prefilled Syringes: # 150mg  1  #75mg  1 Arrival Date: 02/20/18 Lot #: 150mg  [id number]      75mg  [id number] Exp Date: 150mg  07/2018   75mg  07/2018

## 2018-02-22 ENCOUNTER — Ambulatory Visit (INDEPENDENT_AMBULATORY_CARE_PROVIDER_SITE_OTHER): Payer: Medicare Other

## 2018-02-22 DIAGNOSIS — J4540 Moderate persistent asthma, uncomplicated: Secondary | ICD-10-CM

## 2018-02-22 MED ORDER — OMALIZUMAB 150 MG ~~LOC~~ SOLR
225.0000 mg | Freq: Once | SUBCUTANEOUS | Status: AC
  Administered 2018-02-22: 225 mg via SUBCUTANEOUS

## 2018-03-20 ENCOUNTER — Telehealth: Payer: Self-pay | Admitting: Internal Medicine

## 2018-03-20 NOTE — Telephone Encounter (Signed)
Prefilled Syringe: #150mg  1  #75mg  1 Ordered Date: 03/20/18 Shipping Date: 03/20/18

## 2018-03-21 ENCOUNTER — Ambulatory Visit: Payer: Medicare Other | Admitting: Internal Medicine

## 2018-03-21 ENCOUNTER — Encounter: Payer: Self-pay | Admitting: Internal Medicine

## 2018-03-21 DIAGNOSIS — Z23 Encounter for immunization: Secondary | ICD-10-CM

## 2018-03-21 DIAGNOSIS — J302 Other seasonal allergic rhinitis: Secondary | ICD-10-CM | POA: Diagnosis not present

## 2018-03-21 DIAGNOSIS — K219 Gastro-esophageal reflux disease without esophagitis: Secondary | ICD-10-CM | POA: Diagnosis not present

## 2018-03-21 DIAGNOSIS — J4520 Mild intermittent asthma, uncomplicated: Secondary | ICD-10-CM

## 2018-03-21 DIAGNOSIS — J3089 Other allergic rhinitis: Secondary | ICD-10-CM | POA: Diagnosis not present

## 2018-03-21 NOTE — Patient Instructions (Addendum)
We can continue current meds, including Xolair  Order- flu vaccine senior  Please call if we can help

## 2018-03-21 NOTE — Progress Notes (Signed)
Patient ID: Deborah Branch, female    DOB: 1934/07/05, 83 y.o.   MRN: [id number]  HPI  female never smoker followed for chronic asthma (Xolair), allergic rhinosinusitis, complicated by GERD Office Spirometry 08/26/15-moderate obstruction-FVC 1.7/76%, FEV1 1.1/67%, ratio 0.66, FEF 25-75% 0.6/41% --------------------------------------------------------------------------------------------  03/19/17- 83 year old female never smoker followed for chronic asthma (Xolair), allergic rhinosinusitis, complicated by GERD FOLLOWS FOR: Pt continues on Xolair; Pt has noticed cough and wheezing past 2 weeks. Pt has used nebulizer treatments but would like to have something to help with cough. Pt started using Duoneb and this works better than albuterol.  Resumed Dulera 100. Increased wheeze, cough and some yellow sputum over the last 2 weeks without fever or sore throat.   03/21/2018- 83 year old female never smoker followed for chronic asthma (Xolair), allergic rhinosinusitis, complicated by GERD -----Asthma: Pt continues take Xolair; pt has slight wheezing today but states she has not noticed it herself.  Pro-air HFA, neb Duoneb, Breo 100,  Feels well.  Occasional wheezing is faint and may be exertional.  She had not felt need for nebulizer all spring which used to be a problem season for her. CXR 02/01/2018- IMPRESSION: Negative for acute cardiopulmonary disease  Review of Systems- see HPI  + = positive Constitutional:   No-   weight loss, night sweats, fevers, chills, fatigue, lassitude. HEENT:   No-  headaches, difficulty swallowing, tooth/dental problems, +sore throat,       No-  sneezing, itching, ear ache, +nasal congestion, + post nasal drip,  CV:  No-   chest pain, orthopnea, PND, swelling in lower extremities, anasarca, dizziness, palpitations Resp: no-shortness of breath with exertion or at rest.              +  productive cough,   non-productive cough,  No-  coughing up of blood.            +  change in color of mucus.  +.   Skin: No-   rash or lesions. GI:  No-   heartburn, indigestion, abdominal pain, nausea, vomiting,  GU: MS:  No-   joint pain or swelling. Neuro- grossly normal  Psych:  No- change in mood or affect. No depression or anxiety.  No memory loss.     Objective:   Physical Exam General- Alert, Oriented, Affect-appropriate, Distress- none acute  Medium build Skin- rash-none, lesions- none, excoriation- none Lymphadenopathy- none Head- atraumatic            Eyes- Gross vision intact, PERRLA, conjunctivae clear secretions            Ears- Hearing, canals normal            Nose- No sniffing, no-Septal dev, mucus, polyps, erosion, perforation             Throat- Mallampati III-IV , mucosa clear- not red , drainage- none, tonsils- atrophic Neck- flexible , trachea midline, no stridor , thyroid nl, carotid no bruit Chest - symmetrical excursion , unlabored           Heart/CV- RRR , + murmur 1/6 syst , no gallop  , no rub, nl s1 s2                           - JVD- none , edema- none, stasis changes- none, varices- none           Lung-  Wheeze+slight , dullness-none, rub- none, cough -none  Chest wall-  Abd-  Br/ Gen/ Rectal- Not done, not indicated Extrem- cyanosis- none, clubbing, none, atrophy- none, strength- nl Neuro- grossly intact to observation

## 2018-03-21 NOTE — Telephone Encounter (Signed)
Prefilled Syringes: # 150mg  1  #75mg  1 Arrival Date: 03/21/18 Lot #: 150mg  [id number]      75mg  [id number] Exp Date: 150mg  08/2018   75mg  08/2018

## 2018-03-25 ENCOUNTER — Ambulatory Visit (INDEPENDENT_AMBULATORY_CARE_PROVIDER_SITE_OTHER): Payer: Medicare Other

## 2018-03-25 DIAGNOSIS — J4540 Moderate persistent asthma, uncomplicated: Secondary | ICD-10-CM

## 2018-03-25 MED ORDER — OMALIZUMAB 150 MG ~~LOC~~ SOLR
225.0000 mg | Freq: Once | SUBCUTANEOUS | Status: AC
  Administered 2018-03-25: 225 mg via SUBCUTANEOUS

## 2018-03-28 ENCOUNTER — Ambulatory Visit: Payer: Medicare Other

## 2018-03-31 NOTE — Assessment & Plan Note (Signed)
Reminded to continue reflux precautions.

## 2018-03-31 NOTE — Assessment & Plan Note (Signed)
She continues to have very little problem with rhinitis now.  Unclear if the Xolair injections make the difference, or simple aging.  She is comfortable.

## 2018-03-31 NOTE — Assessment & Plan Note (Signed)
Good control.  Medications reviewed. Plan-continue Xolair.  Flu vaccine

## 2018-04-04 ENCOUNTER — Telehealth: Payer: Self-pay | Admitting: Internal Medicine

## 2018-04-04 NOTE — Telephone Encounter (Signed)
Prefilled Syringe: #150mg  1  #75mg  1 Ordered Date: 10.10.19 Shipping Date: 10.11.19

## 2018-04-05 NOTE — Telephone Encounter (Signed)
Prefilled Syringes: # 150mg  1  #75mg  1 Arrival Date: 10.11.19 Lot #: 150mg  [id number]      75mg  [id number] Exp Date: 150mg  09/2018   75mg  08/2018

## 2018-04-23 ENCOUNTER — Other Ambulatory Visit: Payer: Self-pay | Admitting: Internal Medicine

## 2018-04-24 ENCOUNTER — Ambulatory Visit (INDEPENDENT_AMBULATORY_CARE_PROVIDER_SITE_OTHER): Payer: Medicare Other

## 2018-04-24 DIAGNOSIS — J4540 Moderate persistent asthma, uncomplicated: Secondary | ICD-10-CM | POA: Diagnosis not present

## 2018-04-25 MED ORDER — OMALIZUMAB 150 MG ~~LOC~~ SOLR
225.0000 mg | Freq: Once | SUBCUTANEOUS | Status: AC
  Administered 2018-04-24: 225 mg via SUBCUTANEOUS

## 2018-04-25 NOTE — Progress Notes (Signed)
Documented by Amarii Bordas CMA based on hand-written Xolair documentation sheet completed by Tammy Plantz CMA, who administered the medication.  

## 2018-05-20 ENCOUNTER — Telehealth: Payer: Self-pay | Admitting: Internal Medicine

## 2018-05-20 NOTE — Telephone Encounter (Signed)
Prefilled Syringes: # 150mg  1  #75mg  1 Arrival Date: 05/20/18 Lot #: 150mg  [id number]      75mg  [id number] Exp Date: 150mg  10/2018   75mg  08/2018  There was mix up with the address and the order in general.

## 2018-05-22 ENCOUNTER — Ambulatory Visit (INDEPENDENT_AMBULATORY_CARE_PROVIDER_SITE_OTHER): Payer: Medicare Other

## 2018-05-22 DIAGNOSIS — J4540 Moderate persistent asthma, uncomplicated: Secondary | ICD-10-CM | POA: Diagnosis not present

## 2018-05-22 MED ORDER — OMALIZUMAB 150 MG ~~LOC~~ SOLR
225.0000 mg | SUBCUTANEOUS | Status: DC
  Administered 2018-05-22: 225 mg via SUBCUTANEOUS

## 2018-05-22 NOTE — Progress Notes (Signed)
Documentation of medication administration and charges of Xolair have been completed by Rhyanna Sorce, CMA based on the hand written Xolair documentation sheet completed by Tammy Harland, who administered the medication.  

## 2018-06-11 ENCOUNTER — Telehealth: Payer: Self-pay | Admitting: Internal Medicine

## 2018-06-11 NOTE — Telephone Encounter (Signed)
Prefilled Syringes: # 150mg  1  #75mg  1 Arrival Date: 06/11/18 Lot #: 150mg  [id number]      75mg [id number] Exp Date: 150mg  11/2018   75mg  12/2018  Didn't get a chance to put order in.

## 2018-06-21 ENCOUNTER — Ambulatory Visit (INDEPENDENT_AMBULATORY_CARE_PROVIDER_SITE_OTHER): Payer: Medicare Other

## 2018-06-21 DIAGNOSIS — J4540 Moderate persistent asthma, uncomplicated: Secondary | ICD-10-CM | POA: Diagnosis not present

## 2018-06-21 MED ORDER — OMALIZUMAB 150 MG ~~LOC~~ SOLR
225.0000 mg | Freq: Once | SUBCUTANEOUS | Status: AC
  Administered 2018-06-21: 225 mg via SUBCUTANEOUS

## 2018-07-22 ENCOUNTER — Ambulatory Visit (INDEPENDENT_AMBULATORY_CARE_PROVIDER_SITE_OTHER): Payer: Medicare Other

## 2018-07-22 DIAGNOSIS — J4520 Mild intermittent asthma, uncomplicated: Secondary | ICD-10-CM | POA: Diagnosis not present

## 2018-07-22 MED ORDER — OMALIZUMAB 150 MG ~~LOC~~ SOLR
225.0000 mg | Freq: Once | SUBCUTANEOUS | Status: AC
  Administered 2018-07-22: 225 mg via SUBCUTANEOUS

## 2018-08-21 ENCOUNTER — Telehealth: Payer: Self-pay | Admitting: Internal Medicine

## 2018-08-22 ENCOUNTER — Ambulatory Visit (INDEPENDENT_AMBULATORY_CARE_PROVIDER_SITE_OTHER): Payer: Medicare Other

## 2018-08-22 DIAGNOSIS — J4540 Moderate persistent asthma, uncomplicated: Secondary | ICD-10-CM

## 2018-08-22 NOTE — Telephone Encounter (Signed)
Prefilled Syringes: # 150mg  1  #75mg  1 Arrival Date: 08/22/2018 Lot #: 150mg  [id number]      75mg  [id number] Exp Date: 150mg  05/2019   75mg  05/2019

## 2018-08-22 NOTE — Telephone Encounter (Signed)
Prefilled Syringe: #150mg  1  #75mg  1 Ordered Date: 08/21/2018 Shipping Date: 08/21/2018

## 2018-08-23 MED ORDER — OMALIZUMAB 150 MG ~~LOC~~ SOLR
225.0000 mg | Freq: Once | SUBCUTANEOUS | Status: AC
  Administered 2018-08-22: 225 mg via SUBCUTANEOUS

## 2018-09-17 ENCOUNTER — Telehealth: Payer: Self-pay | Admitting: Internal Medicine

## 2018-09-17 NOTE — Telephone Encounter (Signed)
Prefilled Syringes: # 150mg  1  #75mg  1 Arrival Date: 09/17/2018 Lot #: 150mg  [id number]      75mg [id number] Exp Date: 150mg  07/2019   75mg  05/2019

## 2018-09-17 NOTE — Telephone Encounter (Signed)
Prefilled Syringe: #150mg  1  #75mg  1 Ordered Date: 09/16/2018 Shipping Date: 09/16/2018

## 2018-09-20 ENCOUNTER — Ambulatory Visit (INDEPENDENT_AMBULATORY_CARE_PROVIDER_SITE_OTHER): Payer: Medicare Other

## 2018-09-20 ENCOUNTER — Telehealth: Payer: Self-pay | Admitting: Internal Medicine

## 2018-09-20 ENCOUNTER — Other Ambulatory Visit: Payer: Self-pay

## 2018-09-20 DIAGNOSIS — J4540 Moderate persistent asthma, uncomplicated: Secondary | ICD-10-CM | POA: Diagnosis not present

## 2018-09-20 MED ORDER — BUDESONIDE-FORMOTEROL FUMARATE 80-4.5 MCG/ACT IN AERO: 2.0000 | INHALATION_SPRAY | Freq: Two times a day (BID) | RESPIRATORY_TRACT | 11 refills | Status: DC

## 2018-09-20 MED ORDER — OMALIZUMAB 150 MG ~~LOC~~ SOLR
225.0000 mg | Freq: Once | SUBCUTANEOUS | Status: AC
  Administered 2018-09-20: 225 mg via SUBCUTANEOUS

## 2018-09-20 NOTE — Telephone Encounter (Signed)
Called and spoke with pt. Pt stated she went to pharmacy to pick up a refill of Breo and she was told by the pharmacy that they were out of Breo. Pt is wanting to know if there is another inhaler that CY would recommend in place of Breo for now since the pharmacy was out.  Just to let you know that we are currently not giving out any samples to patients other than the ones who are in the office for a visit.  Dr. Maple Hudson, please advise on this for pt. Thanks!  Allergies  Allergen Reactions  . Clarithromycin     REACTION: itching  . Penicillins     REACTION: hives     Current Outpatient Medications:  .  aspirin 81 MG tablet, Take 81 mg by mouth daily.  , Disp: , Rfl:  .  BREO ELLIPTA 100-25 MCG/INH AEPB, INHALE ONE PUFF ONCE DAILY, Disp: 60 each, Rfl: 12 .  Calcium Carbonate (CALTRATE 600) 1500 MG TABS, Take 1 tablet by mouth daily.  , Disp: , Rfl:  .  Cholecalciferol (VITAMIN D) 2000 UNITS CAPS, Take 1 capsule by mouth daily.  , Disp: , Rfl:  .  escitalopram (LEXAPRO) 5 MG tablet, Take 5 mg by mouth daily., Disp: , Rfl: 1 .  ipratropium-albuterol (DUONEB) 0.5-2.5 (3) MG/3ML SOLN, Take 3 mLs by nebulization every 6 (six) hours as needed., Disp: 225 mL, Rfl: 12 .  losartan-hydrochlorothiazide (HYZAAR) 50-12.5 MG per tablet, Take 1 tablet by mouth daily., Disp: , Rfl: 1 .  nabumetone (RELAFEN) 500 MG tablet, Take 1 tablet by mouth daily., Disp: , Rfl: 3 .  Nebulizers (COMPRESSOR/NEBULIZER) MISC, Use every 6 hours for wheezing, Disp: 1 each, Rfl: 0 .  omeprazole (PRILOSEC) 20 MG capsule, Take 20 mg by mouth daily.  , Disp: , Rfl:  .  PROAIR HFA 108 (90 Base) MCG/ACT inhaler, INHALE TWO PUFFS BY MOUTH EVERY 4 HOURS AS NEEDED FOR WHEEZING AND SHORTNESS OF BREATH, Disp: 8.5 g, Rfl: 3 .  simvastatin (ZOCOR) 20 MG tablet, Take 20 mg by mouth at bedtime.  , Disp: , Rfl:

## 2018-09-20 NOTE — Telephone Encounter (Signed)
Spoke with pt. She is aware of CY's recommendation. Rx has been sent in. Nothing further was needed.  

## 2018-09-20 NOTE — Telephone Encounter (Signed)
Offer script for Generic Symbicort 80, # 1, inhale 2 puffs then rinse mouth, twice daily, refill x 1 year

## 2018-10-21 ENCOUNTER — Ambulatory Visit (INDEPENDENT_AMBULATORY_CARE_PROVIDER_SITE_OTHER): Payer: Medicare Other

## 2018-10-21 ENCOUNTER — Other Ambulatory Visit: Payer: Self-pay

## 2018-10-21 DIAGNOSIS — J4540 Moderate persistent asthma, uncomplicated: Secondary | ICD-10-CM | POA: Diagnosis not present

## 2018-10-21 MED ORDER — OMALIZUMAB 150 MG ~~LOC~~ SOLR
225.0000 mg | Freq: Once | SUBCUTANEOUS | Status: AC
  Administered 2018-10-21: 225 mg via SUBCUTANEOUS

## 2018-10-21 NOTE — Progress Notes (Signed)
Have you been hospitalized within the last 10 days?  No Do you have a fever?  No Do you have a cough?  No Do you have a headache or sore throat? No  

## 2018-10-21 NOTE — Patient Instructions (Signed)
Deborah Branch came in on 10/21/2018 to receive a Xolair injection. The patient is given 225mg  every 28 days. Due to each vial equalling 150mg , 50mg  of medication was wasted.

## 2018-11-20 ENCOUNTER — Other Ambulatory Visit: Payer: Self-pay

## 2018-11-20 ENCOUNTER — Ambulatory Visit (INDEPENDENT_AMBULATORY_CARE_PROVIDER_SITE_OTHER): Payer: Medicare Other

## 2018-11-20 DIAGNOSIS — J4540 Moderate persistent asthma, uncomplicated: Secondary | ICD-10-CM | POA: Diagnosis not present

## 2018-11-20 MED ORDER — OMALIZUMAB 150 MG ~~LOC~~ SOLR
225.0000 mg | Freq: Once | SUBCUTANEOUS | Status: AC
  Administered 2018-11-20: 11:00:00 225 mg via SUBCUTANEOUS

## 2018-11-20 NOTE — Progress Notes (Signed)
Have you been hospitalized within the last 10 days?  No Do you have a fever?  No Do you have a cough?  No Do you have a headache or sore throat? No  

## 2018-11-20 NOTE — Patient Instructions (Addendum)
Deborah Branch came in on 11/20/2018 to receive a Xolair injection. The patient is given 225mg  every 28 days. Due to each vial equalling 150mg , 75mg  of medication was wasted.

## 2018-12-10 ENCOUNTER — Telehealth: Payer: Self-pay | Admitting: Pulmonary Disease

## 2018-12-10 NOTE — Telephone Encounter (Signed)
Xolair Prefilled Syringe Order: 150mg  Prefilled Syringe:  #1 75mg  Prefilled Syringe: #1 Ordered Date: 12/10/2018 Expected date of arrival: 12/11/2018 Ordered by: Desmond Dike, [place]: [facility]

## 2018-12-11 ENCOUNTER — Telehealth: Payer: Self-pay | Admitting: Internal Medicine

## 2018-12-11 NOTE — Telephone Encounter (Signed)
This was an error. Pt does not need PFT.

## 2018-12-11 NOTE — Telephone Encounter (Signed)
Will cancel f/u and PFT -pr

## 2018-12-11 NOTE — Telephone Encounter (Signed)
Xolair Prefilled Syringe Received:  150mg  Prefilled Syringe >> quantity 1, lot # [id number], exp date 08/2019 75mg  Prefilled Syringe >> quantity 1, lot # [id number], exp date 08/2019 Medication arrival date: 12/11/2018 Received by: Desmond Dike, [place]

## 2018-12-18 ENCOUNTER — Ambulatory Visit: Payer: Medicare Other

## 2018-12-19 ENCOUNTER — Ambulatory Visit (INDEPENDENT_AMBULATORY_CARE_PROVIDER_SITE_OTHER): Payer: Medicare Other

## 2018-12-19 ENCOUNTER — Other Ambulatory Visit: Payer: Self-pay

## 2018-12-19 DIAGNOSIS — J4540 Moderate persistent asthma, uncomplicated: Secondary | ICD-10-CM

## 2018-12-19 MED ORDER — OMALIZUMAB 150 MG/ML ~~LOC~~ SOSY
150.0000 mg | PREFILLED_SYRINGE | Freq: Once | SUBCUTANEOUS | Status: AC
  Administered 2018-12-19: 150 mg via SUBCUTANEOUS

## 2018-12-19 MED ORDER — OMALIZUMAB 75 MG/0.5ML ~~LOC~~ SOSY
75.0000 mg | PREFILLED_SYRINGE | Freq: Once | SUBCUTANEOUS | Status: AC
  Administered 2018-12-19: 75 mg via SUBCUTANEOUS

## 2018-12-19 NOTE — Patient Instructions (Signed)
Pt gets 225 mg every 28 days.

## 2018-12-19 NOTE — Progress Notes (Signed)
Have you been hospitalized within the last 10 days?  No Do you have a fever?  No Do you have a cough?  No Do you have a headache or sore throat? No  

## 2018-12-25 ENCOUNTER — Ambulatory Visit: Payer: Medicare Other | Admitting: Primary Care

## 2019-01-06 ENCOUNTER — Telehealth: Payer: Self-pay | Admitting: Internal Medicine

## 2019-01-06 NOTE — Telephone Encounter (Signed)
Xolair Prefilled Syringe Order: 150mg  Prefilled Syringe:  #2 75mg  Prefilled Syringe: #2 Ordered Date: 01/03/2019 Expected date of arrival: 01/06/2019 Ordered by: Desmond Dike, [place]: Nigel Mormon

## 2019-01-06 NOTE — Telephone Encounter (Signed)
Xolair Prefilled Syringe Received:  150mg  Prefilled Syringe >> quantity 2, lot # [id number], exp date 09/2019 75mg  Prefilled Syringe >> quantity 2, lot # [id number], exp date 09/2019 Medication arrival date: 01/06/2019 Received by: TBS

## 2019-01-17 ENCOUNTER — Ambulatory Visit (INDEPENDENT_AMBULATORY_CARE_PROVIDER_SITE_OTHER): Payer: Medicare Other

## 2019-01-17 ENCOUNTER — Other Ambulatory Visit: Payer: Self-pay

## 2019-01-17 DIAGNOSIS — J4540 Moderate persistent asthma, uncomplicated: Secondary | ICD-10-CM

## 2019-01-17 MED ORDER — OMALIZUMAB 150 MG/ML ~~LOC~~ SOSY
150.0000 mg | PREFILLED_SYRINGE | Freq: Once | SUBCUTANEOUS | Status: AC
  Administered 2019-01-17: 150 mg via SUBCUTANEOUS

## 2019-01-17 MED ORDER — OMALIZUMAB 75 MG/0.5ML ~~LOC~~ SOSY
75.0000 mg | PREFILLED_SYRINGE | Freq: Once | SUBCUTANEOUS | Status: AC
Start: 2019-01-17 — End: 2019-01-17
  Administered 2019-01-17: 75 mg via SUBCUTANEOUS

## 2019-01-17 NOTE — Progress Notes (Signed)
All questions were answered by the patient before medication was administered. Have you been hospitalized in the last 10 days? No Do you have a fever? No Do you have a cough? No Do you have a headache or sore throat? No   Denies any problems from last injection. 

## 2019-02-17 ENCOUNTER — Other Ambulatory Visit: Payer: Self-pay

## 2019-02-17 ENCOUNTER — Ambulatory Visit (INDEPENDENT_AMBULATORY_CARE_PROVIDER_SITE_OTHER): Payer: Medicare Other

## 2019-02-17 DIAGNOSIS — J4540 Moderate persistent asthma, uncomplicated: Secondary | ICD-10-CM

## 2019-02-17 MED ORDER — OMALIZUMAB 150 MG/ML ~~LOC~~ SOSY
150.0000 mg | PREFILLED_SYRINGE | Freq: Once | SUBCUTANEOUS | Status: AC
  Administered 2019-02-17: 150 mg via SUBCUTANEOUS

## 2019-02-17 MED ORDER — OMALIZUMAB 75 MG/0.5ML ~~LOC~~ SOSY
75.0000 mg | PREFILLED_SYRINGE | Freq: Once | SUBCUTANEOUS | Status: AC
  Administered 2019-02-17: 75 mg via SUBCUTANEOUS

## 2019-02-17 NOTE — Progress Notes (Signed)
All questions were answered by the patient before medication was administered. Have you been hospitalized in the last 10 days? No Do you have a fever? No Do you have a cough? No Do you have a headache or sore throat? No  

## 2019-03-10 ENCOUNTER — Telehealth: Payer: Self-pay | Admitting: Pulmonary Disease

## 2019-03-10 NOTE — Telephone Encounter (Signed)
Xolair Prefilled Syringe Order: 150mg  Prefilled Syringe:  #2 75mg  Prefilled Syringe: #2 Ordered Date: 03/10/2019 Expected date of arrival: 03/11/2019 Ordered by: Desmond Dike, [place]  [facility]: Nigel Mormon

## 2019-03-11 NOTE — Telephone Encounter (Signed)
Xolair Prefilled Syringe Received:  150mg  Prefilled Syringe >> quantity #2, lot # [id number], exp date 11/25/2019 75mg  Prefilled Syringe >> quantity #2, lot # [id number], exp date 10/25/2019 Medication arrival date: 03/11/19 Received by: Clerence Gubser,LPN

## 2019-03-17 ENCOUNTER — Ambulatory Visit: Payer: Medicare Other

## 2019-03-19 ENCOUNTER — Ambulatory Visit: Payer: Medicare Other | Admitting: Internal Medicine

## 2019-03-19 ENCOUNTER — Other Ambulatory Visit: Payer: Self-pay

## 2019-03-19 ENCOUNTER — Encounter: Payer: Self-pay | Admitting: Internal Medicine

## 2019-03-19 ENCOUNTER — Ambulatory Visit: Payer: Medicare Other

## 2019-03-19 VITALS — BP 136/60 | HR 74 | Temp 97.1°F | Ht 60.0 in | Wt 157.8 lb

## 2019-03-19 DIAGNOSIS — J3089 Other allergic rhinitis: Secondary | ICD-10-CM

## 2019-03-19 DIAGNOSIS — J4520 Mild intermittent asthma, uncomplicated: Secondary | ICD-10-CM

## 2019-03-19 DIAGNOSIS — J4540 Moderate persistent asthma, uncomplicated: Secondary | ICD-10-CM | POA: Diagnosis not present

## 2019-03-19 DIAGNOSIS — Z23 Encounter for immunization: Secondary | ICD-10-CM | POA: Diagnosis not present

## 2019-03-19 DIAGNOSIS — J302 Other seasonal allergic rhinitis: Secondary | ICD-10-CM

## 2019-03-19 MED ORDER — EPINEPHRINE 0.3 MG/0.3ML IJ SOAJ: 0.3000 mg | Freq: Once | INTRAMUSCULAR | 5 refills | Status: AC

## 2019-03-19 MED ORDER — OMALIZUMAB 150 MG/ML ~~LOC~~ SOSY
150.0000 mg | PREFILLED_SYRINGE | Freq: Once | SUBCUTANEOUS | Status: AC
  Administered 2019-03-19: 150 mg via SUBCUTANEOUS

## 2019-03-19 MED ORDER — OMALIZUMAB 75 MG/0.5ML ~~LOC~~ SOSY
75.0000 mg | PREFILLED_SYRINGE | Freq: Once | SUBCUTANEOUS | Status: AC
  Administered 2019-03-19: 10:00:00 75 mg via SUBCUTANEOUS

## 2019-03-19 NOTE — Progress Notes (Signed)
Patient ID: Deborah Branch, female    DOB: Sep 17, 1934, 84 y.o.   MRN: [id number]  HPI  female never smoker followed for chronic asthma (Xolair), allergic rhinosinusitis, complicated by GERD Office Spirometry 08/26/15-moderate obstruction-FVC 1.7/76%, FEV1 1.1/67%, ratio 0.66, FEF 25-75% 0.6/41% --------------------------------------------------------------------------------------------  03/21/2018- 84 year old female never smoker followed for chronic asthma (Xolair), allergic rhinosinusitis, complicated by GERD -----Asthma: Pt continues take Xolair; pt has slight wheezing today but states she has not noticed it herself.  Pro-air HFA, neb Duoneb, Breo 100,        Feels well.  Occasional wheezing is faint and may be exertional.  She had not felt need for nebulizer all spring which used to be a problem season for her. CXR 02/01/2018- IMPRESSION: Negative for acute cardiopulmonary disease  Xolair 03/19/2019- Infusion Clinic note   Have you been hospitalized within the last 10 days?  No Do you have a fever?  No Do you have a cough?  No Do you have a headache or sore throat? No  Epipen prescription sent to [facility] and Patient assistance application for [employer] Epipen given to Patient.  03/19/2019- 84 year old female never smoker followed for chronic asthma (Xolair), allergic rhinosinusitis, complicated by GERD Pro-air HFA, neb Duoneb, Breo 100, Xolair inj, Epipen ------pt received Xolair inj in office today, pt states breathing is at baseline, reports morning congestion                         Husband died- dementia/ heart Infrequent mild wheeze. Meds are adequate, continuing Xolair. Covid careful.  Review of Systems- see HPI  + = positive Constitutional:   No-   weight loss, night sweats, fevers, chills, fatigue, lassitude. HEENT:   No-  headaches, difficulty swallowing, tooth/dental problems, sore throat,       No-  sneezing, itching, ear ache, +nasal congestion, + post nasal drip,   CV:  No-   chest pain, orthopnea, PND, swelling in lower extremities, anasarca, dizziness, palpitations Resp: no-shortness of breath with exertion or at rest.                productive cough,   non-productive cough,  No-  coughing up of blood.             change in color of mucus.  .   Skin: No-   rash or lesions. GI:  No-   heartburn, indigestion, abdominal pain, nausea, vomiting,  GU: MS:  No-   joint pain or swelling. Neuro- grossly normal  Psych:  No- change in mood or affect. No depression or anxiety.  No memory loss.     Objective:   Physical Exam General- Alert, Oriented, Affect-appropriate, Distress- none acute  Medium build Skin- rash-none, lesions- none, excoriation- none Lymphadenopathy- none Head- atraumatic            Eyes- Gross vision intact, PERRLA, conjunctivae clear secretions            Ears- Hearing, canals normal            Nose- No sniffing, no-Septal dev, mucus, polyps, erosion, perforation             Throat- Mallampati III-IV , mucosa clear- not red , drainage- none, tonsils- atrophic Neck- flexible , trachea midline, no stridor , thyroid nl, carotid no bruit Chest - symmetrical excursion , unlabored           Heart/CV- RRR , + murmur 1/6 syst , no gallop  ,  no rub, nl s1 s2                           - JVD- none , edema- none, stasis changes- none, varices- none           Lung-  Wheeze- none , dullness-none, rub- none, cough -none           Chest wall-  Abd-  Br/ Gen/ Rectal- Not done, not indicated Extrem- cyanosis- none, clubbing, none, atrophy- none, strength- nl Neuro- grossly intact to observation

## 2019-03-19 NOTE — Assessment & Plan Note (Signed)
Seasonal symptoms improved by Covid masking.  Ok to continue current meds.

## 2019-03-19 NOTE — Assessment & Plan Note (Signed)
Much improved and well controlled since she began xolair.  She is satisfied with current control Plan- continue meds, Flu vax

## 2019-03-19 NOTE — Patient Instructions (Signed)
Order- flu vax senior  We can continue current meds  Please cal if we can elp

## 2019-04-17 ENCOUNTER — Other Ambulatory Visit: Payer: Self-pay

## 2019-04-17 ENCOUNTER — Ambulatory Visit (INDEPENDENT_AMBULATORY_CARE_PROVIDER_SITE_OTHER): Payer: Medicare Other

## 2019-04-17 DIAGNOSIS — J4540 Moderate persistent asthma, uncomplicated: Secondary | ICD-10-CM

## 2019-04-17 MED ORDER — OMALIZUMAB 150 MG/ML ~~LOC~~ SOSY
150.0000 mg | PREFILLED_SYRINGE | Freq: Once | SUBCUTANEOUS | Status: AC
  Administered 2019-04-17: 150 mg via SUBCUTANEOUS

## 2019-04-17 MED ORDER — OMALIZUMAB 75 MG/0.5ML ~~LOC~~ SOSY
75.0000 mg | PREFILLED_SYRINGE | Freq: Once | SUBCUTANEOUS | Status: AC
  Administered 2019-04-17: 75 mg via SUBCUTANEOUS

## 2019-04-17 NOTE — Progress Notes (Signed)
All questions were answered by the patient before medication was administered. Have you been hospitalized in the last 10 days? No Do you have a fever? No Do you have a cough? No Do you have a headache or sore throat? No  

## 2019-05-05 ENCOUNTER — Telehealth: Payer: Self-pay | Admitting: Internal Medicine

## 2019-05-05 NOTE — Telephone Encounter (Signed)
Xolair Prefilled Syringe Order: 150mg  Prefilled Syringe:  #1 75mg  Prefilled Syringe: N/A Ordered Date: 05/05/2019 Expected date of arrival: 05/06/2019 Ordered by: Desmond Dike, [place]  [facility]ty [facility]

## 2019-05-06 NOTE — Telephone Encounter (Signed)
Xolair Prefilled Syringe Received:  150mg  Prefilled Syringe >> quantity #1, lot # [id number], exp date 12/2019 75mg  Prefilled Syringe >> N/A Medication arrival date: 05/06/2019 Received by: Desmond Dike, [place]

## 2019-05-07 ENCOUNTER — Telehealth: Payer: Self-pay | Admitting: Internal Medicine

## 2019-05-07 NOTE — Telephone Encounter (Signed)
Suggest drinking a little more water to avid getting dehydrated, and take otc Mucinex. These will help clear secretions.

## 2019-05-07 NOTE — Telephone Encounter (Signed)
Called and spoke to patient. Advised patient of CY's recommendations. Patient verbalized understanding. Nothing further needed at this time.

## 2019-05-07 NOTE — Telephone Encounter (Signed)
Spoke with the pt  She is c/o the feeling of phlegm in her throat and states she has to clear her throat a lot during the day  She states that this started about a wk ago  She is not coughing up anything or really coughing at all  She hears a "wheezing" sound in her throat occ  No fever, SOB, fatigue, body aches, and is feeling well in general  She wants to know what CDY can rec for this, thanks  Allergies  Allergen Reactions  . Clarithromycin     REACTION: itching  . Penicillins     REACTION: hives   Current Outpatient Medications on File Prior to Visit  Medication Sig Dispense Refill  . aspirin 81 MG tablet Take 81 mg by mouth daily.      . budesonide-formoterol (SYMBICORT) 80-4.5 MCG/ACT inhaler Inhale 2 puffs into the lungs 2 (two) times daily. 1 Inhaler 11  . Calcium Carbonate (CALTRATE 600) 1500 MG TABS Take 1 tablet by mouth daily.      . Cholecalciferol (VITAMIN D) 2000 UNITS CAPS Take 1 capsule by mouth daily.      Marland Kitchen escitalopram (LEXAPRO) 5 MG tablet Take 5 mg by mouth daily.  1  . ipratropium-albuterol (DUONEB) 0.5-2.5 (3) MG/3ML SOLN Take 3 mLs by nebulization every 6 (six) hours as needed. (Patient not taking: Reported on 03/19/2019) 225 mL 12  . losartan-hydrochlorothiazide (HYZAAR) 50-12.5 MG per tablet Take 1 tablet by mouth daily.  1  . nabumetone (RELAFEN) 500 MG tablet Take 1 tablet by mouth daily.  3  . Nebulizers (COMPRESSOR/NEBULIZER) MISC Use every 6 hours for wheezing (Patient not taking: Reported on 03/19/2019) 1 each 0  . omeprazole (PRILOSEC) 20 MG capsule Take 20 mg by mouth daily.      Marland Kitchen PROAIR HFA 108 (90 Base) MCG/ACT inhaler INHALE TWO PUFFS BY MOUTH EVERY 4 HOURS AS NEEDED FOR WHEEZING AND SHORTNESS OF BREATH (Patient not taking: Reported on 03/19/2019) 8.5 g 3  . simvastatin (ZOCOR) 20 MG tablet Take 20 mg by mouth at bedtime.       No current facility-administered medications on file prior to visit.

## 2019-05-16 ENCOUNTER — Ambulatory Visit (INDEPENDENT_AMBULATORY_CARE_PROVIDER_SITE_OTHER): Payer: Medicare Other

## 2019-05-16 ENCOUNTER — Other Ambulatory Visit: Payer: Self-pay

## 2019-05-16 DIAGNOSIS — J4540 Moderate persistent asthma, uncomplicated: Secondary | ICD-10-CM | POA: Diagnosis not present

## 2019-05-16 MED ORDER — OMALIZUMAB 150 MG/ML ~~LOC~~ SOSY
150.0000 mg | PREFILLED_SYRINGE | Freq: Once | SUBCUTANEOUS | Status: AC
  Administered 2019-05-16: 11:00:00 150 mg via SUBCUTANEOUS

## 2019-05-16 MED ORDER — OMALIZUMAB 75 MG/0.5ML ~~LOC~~ SOSY
75.0000 mg | PREFILLED_SYRINGE | Freq: Once | SUBCUTANEOUS | Status: AC
  Administered 2019-05-16: 75 mg via SUBCUTANEOUS

## 2019-05-16 NOTE — Progress Notes (Signed)
Have you been hospitalized within the last 10 days?  No Do you have a fever?  No Do you have a cough?  No Do you have a headache or sore throat? No  

## 2019-06-09 ENCOUNTER — Telehealth: Payer: Self-pay | Admitting: Internal Medicine

## 2019-06-09 NOTE — Telephone Encounter (Signed)
Xolair Prefilled Syringe Order: 150mg  Prefilled Syringe:  #1 75mg  Prefilled Syringe: #1 Ordered Date: 06/09/2019 Expected date of arrival: 06/10/2019 Ordered by: Desmond Dike, [place]  [facility]ty [facility]

## 2019-06-10 NOTE — Telephone Encounter (Signed)
Xolair Prefilled Syringe Received:  150mg  Prefilled Syringe >> quantity #1, lot # [id number], exp date 01/2020 75mg  Prefilled Syringe >> quantity #1, lot # [id number], exp date 12/2019 Medication arrival date: 06/10/2019 Received by: Desmond Dike, [place]

## 2019-06-16 ENCOUNTER — Ambulatory Visit (INDEPENDENT_AMBULATORY_CARE_PROVIDER_SITE_OTHER): Payer: Medicare Other

## 2019-06-16 ENCOUNTER — Other Ambulatory Visit: Payer: Self-pay

## 2019-06-16 DIAGNOSIS — J4540 Moderate persistent asthma, uncomplicated: Secondary | ICD-10-CM | POA: Diagnosis not present

## 2019-06-16 MED ORDER — OMALIZUMAB 150 MG/ML ~~LOC~~ SOSY
150.0000 mg | PREFILLED_SYRINGE | Freq: Once | SUBCUTANEOUS | Status: AC
  Administered 2019-06-16: 150 mg via SUBCUTANEOUS

## 2019-06-16 MED ORDER — OMALIZUMAB 75 MG/0.5ML ~~LOC~~ SOSY
75.0000 mg | PREFILLED_SYRINGE | Freq: Once | SUBCUTANEOUS | Status: AC
  Administered 2019-06-16: 75 mg via SUBCUTANEOUS

## 2019-06-16 NOTE — Progress Notes (Signed)
All questions were answered by the patient before medication was administered. Have you been hospitalized in the last 10 days? No Do you have a fever? No Do you have a cough? No Do you have a headache or sore throat? No  

## 2019-06-24 ENCOUNTER — Telehealth: Payer: Self-pay | Admitting: Internal Medicine

## 2019-06-24 NOTE — Telephone Encounter (Signed)
Called the patient and made her aware of Dr. Janee Morn response. Patient wrote down the information and repeated it back. Patient voiced understanding. Nothing further needed at this time.

## 2019-06-24 NOTE — Telephone Encounter (Signed)
The best treatment for this is Sudafed 60 mg- not the 24 hour form. Go to the pharmacy counter to ask for this, not the otc "Sudafed-PE", which doesn't do much.

## 2019-06-24 NOTE — Telephone Encounter (Signed)
Spoke with patient. She stated that her congestion is hard to describe. Best described as pressure behind both of her ears. When she swallows, she can hear a clicking noise. Her nose is also congested. When she is able to blow her nose, the mucus is clear. Denied any headaches, fevers, body aches, facial pain or cough. Also denied being around anyone who has been sick recently.   She has not used any OTC medications. Still using her Symbicort daily.   She wants to know if CY would call in something to help with the congestion.   Pharmacy is [facility].   Allergies  Allergen Reactions  . Clarithromycin     REACTION: itching  . Penicillins     REACTION: hives   Current Outpatient Medications on File Prior to Visit  Medication Sig Dispense Refill  . aspirin 81 MG tablet Take 81 mg by mouth daily.      . budesonide-formoterol (SYMBICORT) 80-4.5 MCG/ACT inhaler Inhale 2 puffs into the lungs 2 (two) times daily. 1 Inhaler 11  . Calcium Carbonate (CALTRATE 600) 1500 MG TABS Take 1 tablet by mouth daily.      . Cholecalciferol (VITAMIN D) 2000 UNITS CAPS Take 1 capsule by mouth daily.      Marland Kitchen escitalopram (LEXAPRO) 5 MG tablet Take 5 mg by mouth daily.  1  . ipratropium-albuterol (DUONEB) 0.5-2.5 (3) MG/3ML SOLN Take 3 mLs by nebulization every 6 (six) hours as needed. (Patient not taking: Reported on 03/19/2019) 225 mL 12  . losartan-hydrochlorothiazide (HYZAAR) 50-12.5 MG per tablet Take 1 tablet by mouth daily.  1  . nabumetone (RELAFEN) 500 MG tablet Take 1 tablet by mouth daily.  3  . Nebulizers (COMPRESSOR/NEBULIZER) MISC Use every 6 hours for wheezing (Patient not taking: Reported on 03/19/2019) 1 each 0  . omeprazole (PRILOSEC) 20 MG capsule Take 20 mg by mouth daily.      Marland Kitchen PROAIR HFA 108 (90 Base) MCG/ACT inhaler INHALE TWO PUFFS BY MOUTH EVERY 4 HOURS AS NEEDED FOR WHEEZING AND SHORTNESS OF BREATH (Patient not taking: Reported on 03/19/2019) 8.5 g 3  . simvastatin (ZOCOR) 20  MG tablet Take 20 mg by mouth at bedtime.       No current facility-administered medications on file prior to visit.    CY, please advise. Thanks!

## 2019-07-07 ENCOUNTER — Telehealth: Payer: Self-pay | Admitting: Internal Medicine

## 2019-07-07 NOTE — Telephone Encounter (Signed)
Xolair Prefilled Syringe Order: 150mg  Prefilled Syringe:  #1 75mg  Prefilled Syringe: #1 Ordered Date: 07/07/2019 Expected date of arrival: 07/08/19 Ordered by: Tymere Depuy,LPN Specialty Pharmacy: [facility]

## 2019-07-08 DIAGNOSIS — H52203 Unspecified astigmatism, bilateral: Secondary | ICD-10-CM | POA: Diagnosis not present

## 2019-07-08 DIAGNOSIS — Z961 Presence of intraocular lens: Secondary | ICD-10-CM | POA: Diagnosis not present

## 2019-07-08 NOTE — Telephone Encounter (Signed)
Xolair Prefilled Syringe Received:  150mg  Prefilled Syringe >> quantity #1, lot # , exp date 02/25/2020 75mg  Prefilled Syringe >> quantity #1, lot # 04/26/2020, exp date 01/25/2020 Medication arrival date: 07/08/19 Received by: Journey Castonguay,LPN

## 2019-07-17 ENCOUNTER — Ambulatory Visit: Payer: Medicare Other

## 2019-07-17 ENCOUNTER — Ambulatory Visit (INDEPENDENT_AMBULATORY_CARE_PROVIDER_SITE_OTHER): Payer: Medicare PPO

## 2019-07-17 ENCOUNTER — Other Ambulatory Visit: Payer: Self-pay

## 2019-07-17 DIAGNOSIS — J4540 Moderate persistent asthma, uncomplicated: Secondary | ICD-10-CM

## 2019-07-17 MED ORDER — OMALIZUMAB 75 MG/0.5ML ~~LOC~~ SOSY
75.0000 mg | PREFILLED_SYRINGE | Freq: Once | SUBCUTANEOUS | Status: AC
  Administered 2019-07-17: 75 mg via SUBCUTANEOUS

## 2019-07-17 MED ORDER — OMALIZUMAB 150 MG/ML ~~LOC~~ SOSY
150.0000 mg | PREFILLED_SYRINGE | Freq: Once | SUBCUTANEOUS | Status: AC
  Administered 2019-07-17: 150 mg via SUBCUTANEOUS

## 2019-07-17 NOTE — Progress Notes (Signed)
Have you been hospitalized within the last 10 days?  No Do you have a fever?  No Do you have a cough?  No Do you have a headache or sore throat? No  

## 2019-08-14 ENCOUNTER — Ambulatory Visit: Payer: Medicare PPO

## 2019-08-18 ENCOUNTER — Ambulatory Visit (INDEPENDENT_AMBULATORY_CARE_PROVIDER_SITE_OTHER): Payer: Medicare PPO

## 2019-08-18 ENCOUNTER — Other Ambulatory Visit: Payer: Self-pay

## 2019-08-18 DIAGNOSIS — J4540 Moderate persistent asthma, uncomplicated: Secondary | ICD-10-CM | POA: Diagnosis not present

## 2019-08-18 MED ORDER — OMALIZUMAB 75 MG/0.5ML ~~LOC~~ SOSY
75.0000 mg | PREFILLED_SYRINGE | Freq: Once | SUBCUTANEOUS | Status: AC
  Administered 2019-08-18: 14:00:00 75 mg via SUBCUTANEOUS

## 2019-08-18 MED ORDER — OMALIZUMAB 150 MG/ML ~~LOC~~ SOSY
150.0000 mg | PREFILLED_SYRINGE | Freq: Once | SUBCUTANEOUS | Status: AC
  Administered 2019-08-18: 150 mg via SUBCUTANEOUS

## 2019-08-18 NOTE — Progress Notes (Signed)
All questions were answered by the patient before medication was administered. Have you been hospitalized in the last 10 days? No Do you have a fever? No Do you have a cough? No Do you have a headache or sore throat? No  

## 2019-09-05 ENCOUNTER — Telehealth: Payer: Self-pay | Admitting: Internal Medicine

## 2019-09-05 NOTE — Telephone Encounter (Signed)
Xolair Prefilled Syringe Order: 150mg  Prefilled Syringe:  #1 75mg  Prefilled Syringe: #1 Ordered Date: 09/05/2019 Expected date of arrival: 09/09/2019 Ordered by: 11/05/2019, CMA  Specialty Pharmacy: 09/11/2019

## 2019-09-08 NOTE — Telephone Encounter (Signed)
Xolair Prefilled Syringe Received:  150mg  Prefilled Syringe >> quantity #1, lot # , exp date 04/2020 75mg  Prefilled Syringe >> quantity #1, lot # 05/2020, exp date 02/2020 Medication arrival date: 09/08/2019 Received by: 03/2020, CMA

## 2019-09-16 ENCOUNTER — Ambulatory Visit (INDEPENDENT_AMBULATORY_CARE_PROVIDER_SITE_OTHER): Payer: Medicare PPO

## 2019-09-16 ENCOUNTER — Other Ambulatory Visit: Payer: Self-pay

## 2019-09-16 DIAGNOSIS — J4540 Moderate persistent asthma, uncomplicated: Secondary | ICD-10-CM | POA: Diagnosis not present

## 2019-09-16 MED ORDER — OMALIZUMAB 150 MG/ML ~~LOC~~ SOSY
150.0000 mg | PREFILLED_SYRINGE | Freq: Once | SUBCUTANEOUS | Status: AC
  Administered 2019-09-16: 150 mg via SUBCUTANEOUS

## 2019-09-16 MED ORDER — OMALIZUMAB 75 MG/0.5ML ~~LOC~~ SOSY
75.0000 mg | PREFILLED_SYRINGE | Freq: Once | SUBCUTANEOUS | Status: AC
  Administered 2019-09-16: 75 mg via SUBCUTANEOUS

## 2019-09-16 NOTE — Progress Notes (Signed)
Have you been hospitalized within the last 10 days?  No Do you have a fever?  No Do you have a cough?  No Do you have a headache or sore throat? No Do you have your Epi Pen visible and is it within date?  Yes 

## 2019-09-25 ENCOUNTER — Other Ambulatory Visit: Payer: Self-pay | Admitting: Internal Medicine

## 2019-10-07 ENCOUNTER — Telehealth: Payer: Self-pay | Admitting: Internal Medicine

## 2019-10-07 NOTE — Telephone Encounter (Signed)
Xolair Prefilled Syringe Order: 150mg  Prefilled Syringe:  #1 75mg  Prefilled Syringe: #0 Ordered Date: 10/07/2019 Expected date of arrival: 10/08/2019 Ordered by: 10/09/2019, CMA  Specialty Pharmacy: 10/10/2019

## 2019-10-08 NOTE — Telephone Encounter (Signed)
Xolair Prefilled Syringe Received:  150mg  Prefilled Syringe >> quantity #1, lot # , exp date 04/2020 75mg  Prefilled Syringe >> N/A Medication arrival date: 10/08/2019 Received by: , CMA

## 2019-10-15 ENCOUNTER — Ambulatory Visit (INDEPENDENT_AMBULATORY_CARE_PROVIDER_SITE_OTHER): Payer: Medicare PPO

## 2019-10-15 ENCOUNTER — Other Ambulatory Visit: Payer: Self-pay

## 2019-10-15 ENCOUNTER — Telehealth: Payer: Self-pay | Admitting: Internal Medicine

## 2019-10-15 ENCOUNTER — Ambulatory Visit: Payer: Medicare PPO

## 2019-10-15 DIAGNOSIS — J4540 Moderate persistent asthma, uncomplicated: Secondary | ICD-10-CM | POA: Diagnosis not present

## 2019-10-15 MED ORDER — OMALIZUMAB 75 MG/0.5ML ~~LOC~~ SOSY
75.0000 mg | PREFILLED_SYRINGE | Freq: Once | SUBCUTANEOUS | Status: AC
  Administered 2019-10-15: 14:00:00 75 mg via SUBCUTANEOUS

## 2019-10-15 MED ORDER — OMALIZUMAB 150 MG/ML ~~LOC~~ SOSY
150.0000 mg | PREFILLED_SYRINGE | Freq: Once | SUBCUTANEOUS | Status: AC
  Administered 2019-10-15: 150 mg via SUBCUTANEOUS

## 2019-10-15 NOTE — Telephone Encounter (Signed)
Yes- please leave me the form Thanks

## 2019-10-15 NOTE — Telephone Encounter (Signed)
Pt is ok having DMV form mailed.  Please advise.

## 2019-10-15 NOTE — Telephone Encounter (Signed)
Form has been signed by CY. Spoke with patient. She is aware that the form will be placed in the mail today.   Nothing further needed at time of call.

## 2019-10-15 NOTE — Progress Notes (Signed)
Have you been hospitalized within the last 10 days?  No Do you have a fever?  No Do you have a cough?  No Do you have a headache or sore throat? No Do you have your Epi Pen visible and is it within date?  Yes 

## 2019-10-15 NOTE — Telephone Encounter (Signed)
Spoke with patient. She stated that she is requesting to have a handicap placard form filled out by Dr. Maple Hudson. She would like to have this form mailed back to her once it is complete. She is aware that I will have to ask Dr. Maple Hudson.  Dr. Maple Hudson, please advise if you are ok with signing a placard form for her. Thanks!

## 2019-11-04 ENCOUNTER — Telehealth: Payer: Self-pay | Admitting: Internal Medicine

## 2019-11-04 NOTE — Telephone Encounter (Signed)
Xolair Prefilled Syringe Order: 150mg  Prefilled Syringe:  #1 75mg  Prefilled Syringe: #1 Ordered Date: 11/04/19 Expected date of arrival: 11/05/19 Ordered by: Jamire Shabazz,LPN Specialty Pharmacy: 01/04/20

## 2019-11-05 NOTE — Telephone Encounter (Signed)
Xolair Prefilled Syringe Received:  150mg  Prefilled Syringe >> quantity #1, lot # , exp date 08/23/20 75mg  Prefilled Syringe >> quantity #1, lot # 08/25/20, exp date 05/25/2020 Medication arrival date: 11/05/19 Received by: Dakarri Kessinger,LPN

## 2019-11-13 ENCOUNTER — Other Ambulatory Visit: Payer: Self-pay

## 2019-11-13 ENCOUNTER — Ambulatory Visit (INDEPENDENT_AMBULATORY_CARE_PROVIDER_SITE_OTHER): Payer: Medicare PPO

## 2019-11-13 DIAGNOSIS — J4540 Moderate persistent asthma, uncomplicated: Secondary | ICD-10-CM | POA: Diagnosis not present

## 2019-11-13 MED ORDER — OMALIZUMAB 150 MG/ML ~~LOC~~ SOSY: 300.0000 mg | PREFILLED_SYRINGE | Freq: Once | SUBCUTANEOUS | Status: DC

## 2019-11-13 MED ORDER — OMALIZUMAB 150 MG/ML ~~LOC~~ SOSY
150.0000 mg | PREFILLED_SYRINGE | Freq: Once | SUBCUTANEOUS | Status: AC
  Administered 2019-11-13: 150 mg via SUBCUTANEOUS

## 2019-11-13 MED ORDER — OMALIZUMAB 75 MG/0.5ML ~~LOC~~ SOSY
75.0000 mg | PREFILLED_SYRINGE | Freq: Once | SUBCUTANEOUS | Status: AC
  Administered 2019-11-13: 75 mg via SUBCUTANEOUS

## 2019-11-13 NOTE — Progress Notes (Signed)
All questions were answered by the patient before medication was administered. Have you been hospitalized in the last 10 days? No Do you have a fever? No Do you have a cough? No Do you have a headache or sore throat? No  

## 2019-11-25 ENCOUNTER — Telehealth: Payer: Self-pay | Admitting: Internal Medicine

## 2019-11-25 NOTE — Telephone Encounter (Signed)
Xolair Prefilled Syringe Order: 150mg  Prefilled Syringe:  #1 75mg  Prefilled Syringe: #1 Ordered Date: 11/25/2019 Expected date of arrival: 11/26/2019 Ordered by: 01/25/2020, CMA  Specialty Pharmacy: 01/26/2020

## 2019-11-26 NOTE — Telephone Encounter (Signed)
Xolair Prefilled Syringe Received:  150mg  Prefilled Syringe >> quantity #1, lot # , exp date 08/2020 75mg  Prefilled Syringe >> quantity #1, lot # 09/2020, exp date 04/2020 Medication arrival date: 11/26/2019 Received by: 05/2020, CMA

## 2019-12-12 ENCOUNTER — Other Ambulatory Visit: Payer: Self-pay

## 2019-12-12 ENCOUNTER — Ambulatory Visit (INDEPENDENT_AMBULATORY_CARE_PROVIDER_SITE_OTHER): Payer: Medicare PPO

## 2019-12-12 DIAGNOSIS — J4540 Moderate persistent asthma, uncomplicated: Secondary | ICD-10-CM | POA: Diagnosis not present

## 2019-12-12 MED ORDER — OMALIZUMAB 75 MG/0.5ML ~~LOC~~ SOSY
75.0000 mg | PREFILLED_SYRINGE | Freq: Once | SUBCUTANEOUS | Status: AC
  Administered 2019-12-12: 75 mg via SUBCUTANEOUS

## 2019-12-12 MED ORDER — OMALIZUMAB 150 MG/ML ~~LOC~~ SOSY
150.0000 mg | PREFILLED_SYRINGE | Freq: Once | SUBCUTANEOUS | Status: AC
  Administered 2019-12-12: 150 mg via SUBCUTANEOUS

## 2019-12-12 NOTE — Progress Notes (Signed)
All questions were answered by the patient before medication was administered. Have you been hospitalized in the last 10 days? No Do you have a fever? No Do you have a cough? No Do you have a headache or sore throat? No  

## 2020-01-05 ENCOUNTER — Telehealth: Payer: Self-pay | Admitting: Internal Medicine

## 2020-01-05 NOTE — Telephone Encounter (Signed)
Xolair Prefilled Syringe Order: 150mg  Prefilled Syringe:  #1 75mg  Prefilled Syringe: #0 Ordered Date: 01/05/2020 Expected date of arrival: 01/06/2020 Ordered by: 03/07/2020, CMA  Specialty Pharmacy: 01/08/2020

## 2020-01-06 NOTE — Telephone Encounter (Signed)
Xolair Prefilled Syringe Received:  150mg  Prefilled Syringe >> quantity #1, lot # , exp date 09/2020 75mg  Prefilled Syringe >>  N/A Medication arrival date: 01/06/2020 Received by: , CMA

## 2020-01-12 ENCOUNTER — Ambulatory Visit (INDEPENDENT_AMBULATORY_CARE_PROVIDER_SITE_OTHER): Payer: Medicare PPO

## 2020-01-12 ENCOUNTER — Other Ambulatory Visit: Payer: Self-pay

## 2020-01-12 DIAGNOSIS — J4540 Moderate persistent asthma, uncomplicated: Secondary | ICD-10-CM

## 2020-01-12 MED ORDER — OMALIZUMAB 75 MG/0.5ML ~~LOC~~ SOSY
75.0000 mg | PREFILLED_SYRINGE | Freq: Once | SUBCUTANEOUS | Status: AC
  Administered 2020-01-12: 75 mg via SUBCUTANEOUS

## 2020-01-12 MED ORDER — OMALIZUMAB 150 MG/ML ~~LOC~~ SOSY
150.0000 mg | PREFILLED_SYRINGE | Freq: Once | SUBCUTANEOUS | Status: AC
  Administered 2020-01-12: 150 mg via SUBCUTANEOUS

## 2020-01-12 NOTE — Progress Notes (Signed)
Have you been hospitalized within the last 10 days?  No Do you have a fever?  No Do you have a cough?  No Do you have a headache or sore throat? No Do you have your Epi Pen visible and is it within date?  Yes 

## 2020-01-15 ENCOUNTER — Telehealth: Payer: Self-pay | Admitting: Internal Medicine

## 2020-01-16 NOTE — Telephone Encounter (Signed)
Called and spoke with Patient.  Patient scheduled 02/10/20, at 1430.

## 2020-01-28 DIAGNOSIS — R7309 Other abnormal glucose: Secondary | ICD-10-CM | POA: Diagnosis not present

## 2020-01-28 DIAGNOSIS — Z0001 Encounter for general adult medical examination with abnormal findings: Secondary | ICD-10-CM | POA: Diagnosis not present

## 2020-01-28 DIAGNOSIS — Z23 Encounter for immunization: Secondary | ICD-10-CM | POA: Diagnosis not present

## 2020-01-28 DIAGNOSIS — K911 Postgastric surgery syndromes: Secondary | ICD-10-CM | POA: Diagnosis not present

## 2020-01-28 DIAGNOSIS — E6609 Other obesity due to excess calories: Secondary | ICD-10-CM | POA: Diagnosis not present

## 2020-01-28 DIAGNOSIS — Z1389 Encounter for screening for other disorder: Secondary | ICD-10-CM | POA: Diagnosis not present

## 2020-01-28 DIAGNOSIS — Z6831 Body mass index (BMI) 31.0-31.9, adult: Secondary | ICD-10-CM | POA: Diagnosis not present

## 2020-01-28 DIAGNOSIS — Z0000 Encounter for general adult medical examination without abnormal findings: Secondary | ICD-10-CM | POA: Diagnosis not present

## 2020-01-28 DIAGNOSIS — K529 Noninfective gastroenteritis and colitis, unspecified: Secondary | ICD-10-CM | POA: Diagnosis not present

## 2020-01-28 DIAGNOSIS — E7849 Other hyperlipidemia: Secondary | ICD-10-CM | POA: Diagnosis not present

## 2020-01-29 ENCOUNTER — Other Ambulatory Visit (HOSPITAL_COMMUNITY): Payer: Self-pay | Admitting: Physician Assistant

## 2020-01-29 ENCOUNTER — Other Ambulatory Visit: Payer: Self-pay | Admitting: Physician Assistant

## 2020-01-29 DIAGNOSIS — Z0001 Encounter for general adult medical examination with abnormal findings: Secondary | ICD-10-CM

## 2020-01-29 DIAGNOSIS — K911 Postgastric surgery syndromes: Secondary | ICD-10-CM

## 2020-02-02 ENCOUNTER — Telehealth: Payer: Self-pay | Admitting: Internal Medicine

## 2020-02-02 NOTE — Telephone Encounter (Signed)
Xolair Prefilled Syringe Order: 150mg  Prefilled Syringe:  #1 75mg  Prefilled Syringe: #1 Ordered Date: 02/02/2020 Expected date of arrival: 02/03/2020 Ordered by: 04/03/2020, CMA  Specialty Pharmacy: 04/04/2020

## 2020-02-03 NOTE — Telephone Encounter (Signed)
Xolair Prefilled Syringe Received:  150mg  Prefilled Syringe >> quantity #1, lot # , exp date 11/2020 75mg  Prefilled Syringe >> quantity #1, lot # 12/2020, exp date 02/2021 Medication arrival date: 02/03/2020 Received by: 03/2021, CMA

## 2020-02-06 ENCOUNTER — Ambulatory Visit (HOSPITAL_COMMUNITY)
Admission: RE | Admit: 2020-02-06 | Discharge: 2020-02-06 | Disposition: A | Discharge status: Home / Self Care | Payer: Medicare PPO | Source: Ambulatory Visit | Attending: Physician Assistant | Admitting: Physician Assistant

## 2020-02-06 ENCOUNTER — Other Ambulatory Visit: Payer: Self-pay

## 2020-02-06 DIAGNOSIS — K911 Postgastric surgery syndromes: Secondary | ICD-10-CM | POA: Insufficient documentation

## 2020-02-06 DIAGNOSIS — K760 Fatty (change of) liver, not elsewhere classified: Secondary | ICD-10-CM | POA: Diagnosis not present

## 2020-02-06 DIAGNOSIS — Z0001 Encounter for general adult medical examination with abnormal findings: Secondary | ICD-10-CM | POA: Insufficient documentation

## 2020-02-06 DIAGNOSIS — R197 Diarrhea, unspecified: Secondary | ICD-10-CM | POA: Diagnosis not present

## 2020-02-06 DIAGNOSIS — K7689 Other specified diseases of liver: Secondary | ICD-10-CM | POA: Diagnosis not present

## 2020-02-09 ENCOUNTER — Ambulatory Visit: Payer: Medicare PPO

## 2020-02-10 ENCOUNTER — Ambulatory Visit (INDEPENDENT_AMBULATORY_CARE_PROVIDER_SITE_OTHER): Payer: Medicare PPO

## 2020-02-10 ENCOUNTER — Other Ambulatory Visit: Payer: Self-pay

## 2020-02-10 DIAGNOSIS — J4540 Moderate persistent asthma, uncomplicated: Secondary | ICD-10-CM | POA: Diagnosis not present

## 2020-02-10 MED ORDER — OMALIZUMAB 150 MG/ML ~~LOC~~ SOSY
150.0000 mg | PREFILLED_SYRINGE | Freq: Once | SUBCUTANEOUS | Status: AC
  Administered 2020-02-10: 150 mg via SUBCUTANEOUS

## 2020-02-10 MED ORDER — OMALIZUMAB 75 MG/0.5ML ~~LOC~~ SOSY
75.0000 mg | PREFILLED_SYRINGE | Freq: Once | SUBCUTANEOUS | Status: AC
  Administered 2020-02-10: 75 mg via SUBCUTANEOUS

## 2020-02-10 NOTE — Progress Notes (Signed)
Have you been hospitalized within the last 10 days?  No Do you have a fever?  No Do you have a cough?  No Do you have a headache or sore throat? No Do you have your Epi Pen visible and is it within date?  Yes 

## 2020-02-19 ENCOUNTER — Encounter: Payer: Self-pay | Admitting: Nurse Practitioner

## 2020-03-02 ENCOUNTER — Telehealth: Payer: Self-pay | Admitting: Internal Medicine

## 2020-03-02 NOTE — Telephone Encounter (Signed)
Xolair Prefilled Syringe Order: 150mg  Prefilled Syringe:  #1 75mg  Prefilled Syringe: #1 Ordered Date: 03/02/20 Expected date of arrival: 9/8-03/04/20 Ordered by: Hayden Kihara,LPN Specialty Pharmacy: 05/02/20

## 2020-03-03 NOTE — Telephone Encounter (Signed)
Xolair Prefilled Syringe Received:  150mg  Prefilled Syringe >> quantity #1, lot # , exp date 02/23/2021 75mg  Prefilled Syringe >> quantity #1, lot # 02/25/2021, exp date 03/25/2021 Medication arrival date: 03/03/20 Received by: Aairah Negrette,LPN

## 2020-03-11 ENCOUNTER — Ambulatory Visit (INDEPENDENT_AMBULATORY_CARE_PROVIDER_SITE_OTHER): Payer: Medicare PPO

## 2020-03-11 ENCOUNTER — Other Ambulatory Visit: Payer: Self-pay

## 2020-03-11 DIAGNOSIS — J4540 Moderate persistent asthma, uncomplicated: Secondary | ICD-10-CM | POA: Diagnosis not present

## 2020-03-11 MED ORDER — OMALIZUMAB 75 MG/0.5ML ~~LOC~~ SOSY
75.0000 mg | PREFILLED_SYRINGE | Freq: Once | SUBCUTANEOUS | Status: AC
  Administered 2020-03-11: 75 mg via SUBCUTANEOUS

## 2020-03-11 MED ORDER — OMALIZUMAB 150 MG/ML ~~LOC~~ SOSY
150.0000 mg | PREFILLED_SYRINGE | Freq: Once | SUBCUTANEOUS | Status: AC
  Administered 2020-03-11: 150 mg via SUBCUTANEOUS

## 2020-03-11 NOTE — Progress Notes (Signed)
Have you been hospitalized within the last 10 days?  No Do you have a fever?  No Do you have a cough?  No Do you have a headache or sore throat? No Do you have your Epi Pen visible and is it within date?  Yes 

## 2020-03-18 ENCOUNTER — Ambulatory Visit: Payer: Medicare PPO | Admitting: Internal Medicine

## 2020-03-18 ENCOUNTER — Other Ambulatory Visit: Payer: Self-pay

## 2020-03-18 ENCOUNTER — Encounter: Payer: Self-pay | Admitting: Internal Medicine

## 2020-03-18 VITALS — BP 130/64 | HR 80 | Temp 96.5°F | Ht 60.0 in | Wt 164.8 lb

## 2020-03-18 DIAGNOSIS — Z23 Encounter for immunization: Secondary | ICD-10-CM

## 2020-03-18 DIAGNOSIS — J302 Other seasonal allergic rhinitis: Secondary | ICD-10-CM | POA: Diagnosis not present

## 2020-03-18 DIAGNOSIS — J3089 Other allergic rhinitis: Secondary | ICD-10-CM | POA: Diagnosis not present

## 2020-03-18 DIAGNOSIS — J4520 Mild intermittent asthma, uncomplicated: Secondary | ICD-10-CM | POA: Diagnosis not present

## 2020-03-18 MED ORDER — ALBUTEROL SULFATE HFA 108 (90 BASE) MCG/ACT IN AERS: INHALATION_SPRAY | RESPIRATORY_TRACT | 12 refills | Status: DC

## 2020-03-18 NOTE — Patient Instructions (Addendum)
Order- Flu vax- senior  Adult nurse inhaler  Suggest aiming to get a booster for your Phizer covid vaccine in November  Suggest we try increasing the interval between Xolair injections to every 2 months, to see how you do. Eventually we may try to quit.  Suggest using otc Flonase nasal spray, 2 puffs each nostril every night at bedtime. It may take a few days, but see if this begins to help with the drainage feeling in your throat.  Please call if we can help

## 2020-03-18 NOTE — Progress Notes (Signed)
Patient ID: Deborah Branch, female    DOB: [birth year], 85 y.o.   MRN: [id number]  HPI  female never smoker followed for chronic asthma (Xolair), allergic rhinosinusitis, complicated by GERD Office Spirometry 08/26/15-moderate obstruction-FVC 1.7/76%, FEV1 1.1/67%, ratio 0.66, FEF 25-75% 0.6/41% --------------------------------------------------------------------------------------------   03/21/2018- 85 year old female never smoker followed for chronic asthma (Xolair), allergic rhinosinusitis, complicated by GERD -----Asthma: Pt continues take Xolair; pt has slight wheezing today but states she has not noticed it herself.  Pro-air HFA, neb Duoneb, Breo 100,  Feels well.  Occasional wheezing is faint and may be exertional.  She had not felt need for nebulizer all spring which used to be a problem season for her. CXR 02/01/2018- IMPRESSION: Negative for acute cardiopulmonary disease   03/18/20- 85 year old female never smoker followed for chronic asthma (Xolair), allergic rhinosinusitis, complicated by GERD Symbicort 80, Proair hfa, neb Duoneb,  Xolair Covid vax- 2 Phizer Flu vax- today senior -----Pt states she has been doing good since last visit. States she does become hoarse at times and has an occassional cough with clear phlegm which is at times yellow in color. Continues Xolair. We discussed tapering off and eventually trying to quit.  Seldom needs rescue inhaler. Some PN drip.   Review of Systems- see HPI  + = positive Constitutional:   No-   weight loss, night sweats, fevers, chills, fatigue, lassitude. HEENT:   No-  headaches, difficulty swallowing, tooth/dental problems, +sore throat,       No-  sneezing, itching, ear ache, +nasal congestion, + post nasal drip,  CV:  No-   chest pain, orthopnea, PND, swelling in lower extremities, anasarca, dizziness, palpitations Resp: no-shortness of breath with exertion or at rest.              +  productive cough,   non-productive cough,  No-   coughing up of blood.             change in color of mucus.     Skin: No-   rash or lesions. GI:  No-   heartburn, indigestion, abdominal pain, nausea, vomiting,  GU: MS:  No-   joint pain or swelling. Neuro- grossly normal  Psych:  No- change in mood or affect. No depression or anxiety.  No memory loss.     Objective:   Physical Exam General- Alert, Oriented, Affect-appropriate, Distress- none acute  Medium build Skin- rash-none, lesions- none, excoriation- none Lymphadenopathy- none Head- atraumatic            Eyes- Gross vision intact, PERRLA, conjunctivae clear secretions            Ears- Hearing, canals normal            Nose- No sniffing, no-Septal dev, mucus, polyps, erosion, perforation             Throat- Mallampati III-IV , mucosa clear- not red , drainage- none, tonsils- atrophic Neck- flexible , trachea midline, no stridor , thyroid nl, carotid no bruit Chest - symmetrical excursion , unlabored           Heart/CV- RRR , + murmur 1/6 syst , no gallop  , no rub, nl s1 s2                           - JVD- none , edema- none, stasis changes- none, varices- none           Lung-  Wheeze- none , dullness-none,  rub- none, cough -none           Chest wall-  Abd-  Br/ Gen/ Rectal- Not done, not indicated Extrem- cyanosis- none, clubbing, none, atrophy- none, strength- nl Neuro- grossly intact to observation

## 2020-03-22 ENCOUNTER — Telehealth: Payer: Self-pay | Admitting: Internal Medicine

## 2020-03-22 NOTE — Telephone Encounter (Signed)
Xolair Prefilled Syringe Order: 150mg  Prefilled Syringe:  #1 75mg  Prefilled Syringe: #1 Ordered Date: 03/22/20 Expected date of arrival: 03/24/20 Ordered by: Zariyah Stephens,LPN Specialty Pharmacy: 03/24/20

## 2020-03-23 NOTE — Assessment & Plan Note (Signed)
Able to use steroid nasal spray and non-sedating antihistamine when needed

## 2020-03-23 NOTE — Assessment & Plan Note (Signed)
Has remained uncomplicated with rare need for rescue inhaler Plan-refill Proair. Ok to extend Xolair interval to every other month to assess impact. Flu vax.

## 2020-03-25 NOTE — Telephone Encounter (Signed)
Xolair Prefilled Syringe Received:  150mg  Prefilled Syringe >> quantity #1, lot # , exp date 02/23/2021 75mg  Prefilled Syringe >> quantity #1, lot # 02/25/2021, exp date 05/25/2021 Medication arrival date: 03/25/20 Received by: Cedar Ditullio,LPN

## 2020-03-29 ENCOUNTER — Ambulatory Visit (INDEPENDENT_AMBULATORY_CARE_PROVIDER_SITE_OTHER): Payer: Medicare PPO | Admitting: Nurse Practitioner

## 2020-03-29 ENCOUNTER — Encounter: Payer: Self-pay | Admitting: Nurse Practitioner

## 2020-03-29 VITALS — BP 144/82 | HR 67 | Ht 60.0 in | Wt 166.0 lb

## 2020-03-29 DIAGNOSIS — R194 Change in bowel habit: Secondary | ICD-10-CM

## 2020-03-29 MED ORDER — LOPERAMIDE HCL 2 MG PO CAPS: 2.0000 mg | ORAL_CAPSULE | ORAL | 1 refills | Status: DC | PRN

## 2020-03-29 NOTE — Progress Notes (Signed)
ASSESSMENT AND PLAN     # Bowel changes.  --Unformed / loose stool with urgency depending on diet. Normal stools  > 50 % of the time --No alarm features such as blood in stool, weight loss, or anemia.  Normal WBC and hemoglobin August 2021 --Unclear why she has developed some intolerances to certain foods but at this time I think it is reasonable to try Loperamide as previously recommended by PCP. However, since she has normal bowel movements > 50 % of the time I think we need to be careful not to induce constipation. Advised to take loperamide 2 mg prior to going out to dinner. If gets diarrhea after meal then can take an additional dose.  --Follow up in a few months, or sooner if needed. Advised to call if develops new or worsening symptoms.   # GERD --Asymptomatic on daily PPI  HISTORY OF PRESENT ILLNESS     Primary Gastroenterologist :  Lucio Edward, MD  Chief Complaint : bowel changes   Deborah Branch is a 85 y.o. female with PMH / [place] significant for,  but not necessarily limited to: Hypertension, osteoporosis, diverticulosis, adenomatous colon polyps  Patient referred by PCP for diarrhea which has been going on for months. She says stools are loose / unformed with urgency but < 50 % of the time. The remainder of the time her bowel movements are normal. The bowel changes are diet dependent. Salads, corn, and ice cream are some of the things that affect her bowel movements. She cannot correlate the bowel change with any medication changes.  No blood in stool. No weight loss, she has gained weight. Not as physically active lately.  PCP recommended loperamide but she has not tried it  Last colonoscopy December 2014.  No polyps nor cancer.  Given age she was no longer required to have surveillance colonoscopies   Data Reviewed: 11/28/19 PCP WBC 6.5, hemoglobin 14, MCV 91, platelets 239  Albumin 4.4, alk phos 67, AST 29, ALT 21, tbili 0.5 Triglycerides 150   Previous  Endoscopic Evaluations / Pertinent Studies:   Past Medical History:  Diagnosis Date  . Allergic asthma   . GERD (gastroesophageal reflux disease)   . Hyperlipidemia   . Hypertension   . Osteoporosis      Past Surgical History:  Procedure Laterality Date  . CATARACT EXTRACTION, BILATERAL  2012   Family History  Problem Relation Age of Onset  . Colon cancer Neg Hx   . Esophageal cancer Neg Hx   . Rectal cancer Neg Hx   . Stomach cancer Neg Hx    Social History   Tobacco Use  . Smoking status: Never Smoker  . Smokeless tobacco: Never Used  Substance Use Topics  . Alcohol use: Yes    Comment: margarita every month  . Drug use: No   Current Outpatient Medications  Medication Sig Dispense Refill  . albuterol (PROAIR HFA) 108 (90 Base) MCG/ACT inhaler INHALE TWO PUFFS BY MOUTH EVERY 4 HOURS AS NEEDED FOR WHEEZING AND SHORTNESS OF BREATH 18 g 12  . aspirin 81 MG tablet Take 81 mg by mouth daily.      . Calcium Carbonate (CALTRATE 600) 1500 MG TABS Take 1 tablet by mouth daily.      . Cholecalciferol (VITAMIN D) 2000 UNITS CAPS Take 1 capsule by mouth daily.      Marland Kitchen escitalopram (LEXAPRO) 5 MG tablet Take 5 mg by mouth daily.  1  . ipratropium-albuterol (DUONEB)  0.5-2.5 (3) MG/3ML SOLN Take 3 mLs by nebulization every 6 (six) hours as needed. 225 mL 12  . losartan-hydrochlorothiazide (HYZAAR) 50-12.5 MG per tablet Take 1 tablet by mouth daily.  1  . nabumetone (RELAFEN) 500 MG tablet Take 1 tablet by mouth daily.  3  . Omalizumab (XOLAIR [place]) Inject 225 mg into the skin every 28 (twenty-eight) days.    Marland Kitchen omeprazole (PRILOSEC) 20 MG capsule Take 20 mg by mouth daily.      . simvastatin (ZOCOR) 20 MG tablet Take 20 mg by mouth at bedtime.      . SYMBICORT 80-4.5 MCG/ACT inhaler INHALE TWO PUFFS INTO THE LUNGS TWICE DAILY 10.2 g 11   No current facility-administered medications for this visit.   Allergies  Allergen Reactions  . Clarithromycin     REACTION: itching  .  Penicillins     REACTION: hives     Review of Systems:  All systems reviewed and negative except where noted in HPI.   PHYSICAL EXAM :    Wt Readings from Last 3 Encounters:  03/29/20 166 lb (75.3 kg)  03/18/20 164 lb 12.8 oz (74.8 kg)  03/19/19 157 lb 12.8 oz (71.6 kg)    BP (!) 144/82   Pulse 67   Ht 5' (1.524 m)   Wt 166 lb (75.3 kg)   BMI 32.42 kg/m  Constitutional:  Pleasant female in no acute distress. Psychiatric: Normal mood and affect. Behavior is normal. EENT: Pupils normal.  Conjunctivae are normal. No scleral icterus. Neck supple.  Cardiovascular: Normal rate, regular rhythm. No edema Pulmonary/chest: Effort normal and breath sounds normal. No wheezing, rales or rhonchi. Abdominal: Soft, nondistended, nontender. Bowel sounds active throughout. There are no masses palpable. No hepatomegaly. Neurological: Alert and oriented to person place and time. Skin: Skin is warm and dry. No rashes noted.  Tye Savoy, NP  03/29/2020, 11:56 AM  Cc:  Referring Provider Cory Munch, PA-C

## 2020-03-29 NOTE — Progress Notes (Signed)
Reviewed and agree with management plan.  Dannika Hilgeman T. Najee Cowens, MD FACG [facility]  

## 2020-03-29 NOTE — Patient Instructions (Addendum)
If you are age 83 or older, your body mass index should be between 23-30. Your Body mass index is 32.42 kg/m. If this is out of the aforementioned range listed, please consider follow up with your Primary Care Provider.  If you are age 91 or younger, your body mass index should be between 19-25. Your Body mass index is 32.42 kg/m. If this is out of the aformentioned range listed, please consider follow up with your Primary Care Provider.   We have sent the following medications to your pharmacy for you to pick up at your convenience: Imodium 2 mg take one hour prior to going out to dinner and may take another if has episode an episode of diarrhea.

## 2020-04-09 ENCOUNTER — Ambulatory Visit (INDEPENDENT_AMBULATORY_CARE_PROVIDER_SITE_OTHER): Payer: Medicare PPO

## 2020-04-09 ENCOUNTER — Other Ambulatory Visit: Payer: Self-pay

## 2020-04-09 DIAGNOSIS — J4520 Mild intermittent asthma, uncomplicated: Secondary | ICD-10-CM

## 2020-04-09 MED ORDER — OMALIZUMAB 75 MG/0.5ML ~~LOC~~ SOSY
75.0000 mg | PREFILLED_SYRINGE | Freq: Once | SUBCUTANEOUS | Status: AC
  Administered 2020-04-09: 75 mg via SUBCUTANEOUS

## 2020-04-09 MED ORDER — OMALIZUMAB 150 MG/ML ~~LOC~~ SOSY
150.0000 mg | PREFILLED_SYRINGE | Freq: Once | SUBCUTANEOUS | Status: AC
  Administered 2020-04-09: 150 mg via SUBCUTANEOUS

## 2020-04-09 NOTE — Progress Notes (Signed)
Have you been hospitalized within the last 10 days?  No Do you have a fever?  No Do you have a cough?  No Do you have a headache or sore throat? No Do you have your Epi Pen visible and is it within date?  Yes 

## 2020-05-28 ENCOUNTER — Ambulatory Visit: Payer: Self-pay | Admitting: Gastroenterology

## 2020-05-31 ENCOUNTER — Telehealth: Payer: Self-pay | Admitting: Internal Medicine

## 2020-05-31 NOTE — Telephone Encounter (Signed)
Xolair Prefilled Syringe Order: 150mg  Prefilled Syringe:  #1 75mg  Prefilled Syringe: #1 Ordered Date: 05/31/20 Expected date of arrival: 06/01/20 Ordered by: Laylonie Marzec,LPN Specialty Pharmacy: 14/6/21

## 2020-06-01 NOTE — Telephone Encounter (Signed)
Xolair Prefilled Syringe Received:  150mg  Prefilled Syringe >> quantity #1, lot # , exp date 01/22/2021 75mg  Prefilled Syringe >> quantity #1, lot # 01/24/2021, exp date 12/23/2020 Medication arrival date: 06/01/20 Received by: Christionna Poland,LPN

## 2020-06-04 ENCOUNTER — Telehealth: Payer: Self-pay | Admitting: Internal Medicine

## 2020-06-04 ENCOUNTER — Ambulatory Visit: Payer: Self-pay | Admitting: Gastroenterology

## 2020-06-04 NOTE — Telephone Encounter (Signed)
Spoke with Patient.  Patient scheduled 06/08/20 at 4pm for Xolair injection.

## 2020-06-07 ENCOUNTER — Ambulatory Visit: Payer: Medicare PPO

## 2020-06-08 ENCOUNTER — Other Ambulatory Visit: Payer: Self-pay

## 2020-06-08 ENCOUNTER — Ambulatory Visit (INDEPENDENT_AMBULATORY_CARE_PROVIDER_SITE_OTHER): Payer: Medicare PPO

## 2020-06-08 DIAGNOSIS — J4520 Mild intermittent asthma, uncomplicated: Secondary | ICD-10-CM | POA: Diagnosis not present

## 2020-06-08 MED ORDER — OMALIZUMAB 75 MG/0.5ML ~~LOC~~ SOSY
75.0000 mg | PREFILLED_SYRINGE | Freq: Once | SUBCUTANEOUS | Status: AC
  Administered 2020-06-08: 16:00:00 75 mg via SUBCUTANEOUS

## 2020-06-08 MED ORDER — OMALIZUMAB 150 MG/ML ~~LOC~~ SOSY
150.0000 mg | PREFILLED_SYRINGE | Freq: Once | SUBCUTANEOUS | Status: AC
Start: 2020-06-08 — End: 2020-06-08
  Administered 2020-06-08: 16:00:00 150 mg via SUBCUTANEOUS

## 2020-06-08 NOTE — Progress Notes (Signed)
Have you been hospitalized within the last 10 days?  No Do you have a fever?  No Do you have a cough?  No Do you have a headache or sore throat? No Do you have your Epi Pen visible and is it within date?  Yes 

## 2020-07-09 DIAGNOSIS — Z961 Presence of intraocular lens: Secondary | ICD-10-CM | POA: Diagnosis not present

## 2020-07-09 DIAGNOSIS — H52203 Unspecified astigmatism, bilateral: Secondary | ICD-10-CM | POA: Diagnosis not present

## 2020-07-26 ENCOUNTER — Telehealth: Payer: Self-pay | Admitting: Internal Medicine

## 2020-07-26 NOTE — Telephone Encounter (Signed)
Xolair Prefilled Syringe Order: 150mg  Prefilled Syringe:  #1 75mg  Prefilled Syringe: #1 Ordered Date: 07/26/20 Expected date of arrival: 07/27/20 Ordered by: Allyna Pittsley,LPN Specialty Pharmacy: 07/28/20

## 2020-07-27 NOTE — Telephone Encounter (Signed)
Xolair Prefilled Syringe Received:  150mg  Prefilled Syringe >> quantity #1, lot # , exp date 02/23/2021 75mg  Prefilled Syringe >> quantity #1, lot # 02/25/2021, exp date 05/25/2021 Medication arrival date: 07/27/20 Received by: Avinash Maltos,LPN

## 2020-08-03 ENCOUNTER — Other Ambulatory Visit: Payer: Self-pay

## 2020-08-03 ENCOUNTER — Ambulatory Visit (INDEPENDENT_AMBULATORY_CARE_PROVIDER_SITE_OTHER): Payer: Medicare PPO

## 2020-08-03 DIAGNOSIS — J4520 Mild intermittent asthma, uncomplicated: Secondary | ICD-10-CM

## 2020-08-03 MED ORDER — OMALIZUMAB 150 MG/ML ~~LOC~~ SOSY
150.0000 mg | PREFILLED_SYRINGE | Freq: Once | SUBCUTANEOUS | Status: AC
  Administered 2020-08-03: 150 mg via SUBCUTANEOUS

## 2020-08-03 MED ORDER — OMALIZUMAB 75 MG/0.5ML ~~LOC~~ SOSY
75.0000 mg | PREFILLED_SYRINGE | Freq: Once | SUBCUTANEOUS | Status: AC
Start: 2020-08-03 — End: 2020-08-03
  Administered 2020-08-03: 75 mg via SUBCUTANEOUS

## 2020-08-03 NOTE — Progress Notes (Signed)
Have you been hospitalized within the last 10 days?  No Do you have a fever?  No Do you have a cough?  No Do you have a headache or sore throat? No  

## 2020-08-23 ENCOUNTER — Encounter: Payer: Self-pay | Admitting: Internal Medicine

## 2020-08-23 ENCOUNTER — Other Ambulatory Visit (HOSPITAL_COMMUNITY): Payer: Self-pay | Admitting: Pharmacy Technician

## 2020-08-23 ENCOUNTER — Telehealth: Payer: Self-pay | Admitting: Internal Medicine

## 2020-08-23 DIAGNOSIS — J4540 Moderate persistent asthma, uncomplicated: Secondary | ICD-10-CM

## 2020-08-23 NOTE — Telephone Encounter (Signed)
08/23/20  Called and spoke with patient.  Patient reporting increased throat drainage.  Also left nostril congestion.  She is reporting significant amount of throat clearing.  She reports that the nasal drainage is clear.  She reports adherence to her Symbicort 80, generic allergy pill, as well as Flonase 2 sprays each nostril daily at night.  Patient denies fevers.  She has not checked with a thermometer.  She is unwilling to do so over the phone.  She does not feel like she has a temperature.  Overall she reports that she "feels fine".  She is just concerned regarding the significant amount of drainage as well as throat clearing.  Dr. Maple Hudson please advise on your recommendations.  Patient was last seen by Dr. Maple Hudson in September/2021.  With a recommendation for follow-up in 1 year.  Elisha Headland, FNP

## 2020-08-23 NOTE — Telephone Encounter (Signed)
Spoke with the pt and notified of response per Dr Young  °She verbalized understanding  °Nothing further needed °

## 2020-08-23 NOTE — Telephone Encounter (Signed)
This may be either a viral cold or early Spring tree pollen allergy. Suggest a non-sedating antihistamine like claritin, allegra or zyrtec   and start flonase nasal spray.

## 2020-08-25 ENCOUNTER — Telehealth: Payer: Self-pay | Admitting: Pharmacy Technician

## 2020-08-25 NOTE — Telephone Encounter (Signed)
Received notification from [facility] regarding a prior authorization for [facility]. Authorization has been APPROVED from 03/02/ to 06/25/21  Authorization # [id number] Phone # [phone]

## 2020-08-25 NOTE — Telephone Encounter (Signed)
Submitted a Prior Authorization request to [facility] for Deborah Branch (medical benefit) via application/fax. Will update once we receive a response.   REF# [id number] PHONE: [phone]

## 2020-09-28 ENCOUNTER — Ambulatory Visit: Payer: Medicare PPO

## 2020-09-28 ENCOUNTER — Other Ambulatory Visit: Payer: Self-pay

## 2020-09-28 ENCOUNTER — Ambulatory Visit (INDEPENDENT_AMBULATORY_CARE_PROVIDER_SITE_OTHER): Payer: Medicare PPO

## 2020-09-28 VITALS — BP 171/74 | HR 87 | Temp 98.3°F | Resp 18

## 2020-09-28 DIAGNOSIS — J4540 Moderate persistent asthma, uncomplicated: Secondary | ICD-10-CM | POA: Diagnosis not present

## 2020-09-28 MED ORDER — SODIUM CHLORIDE 0.9 % IV SOLN: Freq: Once | INTRAVENOUS | Status: DC | PRN

## 2020-09-28 MED ORDER — METHYLPREDNISOLONE SODIUM SUCC 125 MG IJ SOLR: 125.0000 mg | Freq: Once | INTRAMUSCULAR | Status: DC | PRN

## 2020-09-28 MED ORDER — OMALIZUMAB 75 MG/0.5ML ~~LOC~~ SOSY
225.0000 mg | PREFILLED_SYRINGE | Freq: Once | SUBCUTANEOUS | Status: AC
  Administered 2020-09-28: 225 mg via SUBCUTANEOUS
  Filled 2020-09-28: qty 1

## 2020-09-28 MED ORDER — EPINEPHRINE 0.3 MG/0.3ML IJ SOAJ: 0.3000 mg | Freq: Once | INTRAMUSCULAR | Status: DC | PRN

## 2020-09-28 MED ORDER — FAMOTIDINE IN NACL 20-0.9 MG/50ML-% IV SOLN: 20.0000 mg | Freq: Once | INTRAVENOUS | Status: DC | PRN

## 2020-09-28 MED ORDER — ALBUTEROL SULFATE HFA 108 (90 BASE) MCG/ACT IN AERS: 2.0000 | INHALATION_SPRAY | Freq: Once | RESPIRATORY_TRACT | Status: DC | PRN

## 2020-09-28 MED ORDER — DIPHENHYDRAMINE HCL 50 MG/ML IJ SOLN: 50.0000 mg | Freq: Once | INTRAMUSCULAR | Status: DC | PRN

## 2020-09-28 NOTE — Progress Notes (Signed)
Diagnosis: Asthma  Provider:  Chilton Greathouse, MD  Procedure: Injection  Xolair (Omalizumab), Dose: 225 mg, Site: subcutaneous 150mg  x1 to left arm, 75mg  x1 to right arm/.  Discharge: Condition: Good, Destination: Home . AVS provided to patient.   Performed by:  , RN

## 2020-10-07 ENCOUNTER — Other Ambulatory Visit: Payer: Self-pay | Admitting: Internal Medicine

## 2020-10-19 DIAGNOSIS — L918 Other hypertrophic disorders of the skin: Secondary | ICD-10-CM | POA: Diagnosis not present

## 2020-10-19 DIAGNOSIS — L820 Inflamed seborrheic keratosis: Secondary | ICD-10-CM | POA: Diagnosis not present

## 2020-10-19 DIAGNOSIS — L814 Other melanin hyperpigmentation: Secondary | ICD-10-CM | POA: Diagnosis not present

## 2020-10-19 DIAGNOSIS — D2261 Melanocytic nevi of right upper limb, including shoulder: Secondary | ICD-10-CM | POA: Diagnosis not present

## 2020-10-19 DIAGNOSIS — L821 Other seborrheic keratosis: Secondary | ICD-10-CM | POA: Diagnosis not present

## 2020-10-19 DIAGNOSIS — D485 Neoplasm of uncertain behavior of skin: Secondary | ICD-10-CM | POA: Diagnosis not present

## 2020-10-19 DIAGNOSIS — L578 Other skin changes due to chronic exposure to nonionizing radiation: Secondary | ICD-10-CM | POA: Diagnosis not present

## 2020-11-04 ENCOUNTER — Telehealth: Payer: Self-pay | Admitting: Internal Medicine

## 2020-11-04 MED ORDER — BENZONATATE 200 MG PO CAPS: 200.0000 mg | ORAL_CAPSULE | Freq: Three times a day (TID) | ORAL | 1 refills | Status: DC | PRN

## 2020-11-04 NOTE — Telephone Encounter (Signed)
Spoke with patient who states that she is calling to get something called in for a cough and sore throat that started yesterday. Denies fevers checked it this morning and it was 97.8, denies shortness of breath. Patient states that she thinks its from her allergies and the pollen.  Dr. Maple Hudson please advise

## 2020-11-04 NOTE — Telephone Encounter (Signed)
Offer Tessalon perles 200 mg, # 30, 1 every 8 hours as needed for cough. She can combine this with otc Delsym or Robitussin DM? Cough syrup.

## 2020-11-04 NOTE — Telephone Encounter (Signed)
ATC Patient.  LM to call back when available.  

## 2020-11-04 NOTE — Telephone Encounter (Signed)
Called and spoke with patient. She verbalized understanding. Will go ahead and send in the Tessalon perles. Also suggested the Cepacol tablets to help with her sore throat.   Nothing further needed at time of call.

## 2020-11-08 ENCOUNTER — Other Ambulatory Visit: Payer: Self-pay | Admitting: Internal Medicine

## 2020-11-10 ENCOUNTER — Telehealth: Payer: Self-pay | Admitting: Internal Medicine

## 2020-11-10 MED ORDER — IPRATROPIUM-ALBUTEROL 0.5-2.5 (3) MG/3ML IN SOLN
3.0000 mL | Freq: Four times a day (QID) | RESPIRATORY_TRACT | 0 refills | Status: DC | PRN
Start: 2020-11-10 — End: 2021-12-02

## 2020-11-10 NOTE — Telephone Encounter (Signed)
Rx for duoneb has been sent to preferred pharmacy.  Patient is aware and voiced her understanding.  Nothing further needed at this time.

## 2020-11-12 ENCOUNTER — Telehealth: Payer: Self-pay | Admitting: Internal Medicine

## 2020-11-12 MED ORDER — PREDNISONE 10 MG PO TABS: 10.0000 mg | ORAL_TABLET | Freq: Every day | ORAL | 0 refills | Status: DC

## 2020-11-12 MED ORDER — PREDNISONE 10 MG (21) PO TBPK: ORAL_TABLET | Freq: Every day | ORAL | 0 refills | Status: DC

## 2020-11-12 NOTE — Addendum Note (Signed)
Addended by: Sandra Cockayne on: 11/12/2020 02:45 PM   Modules accepted: Orders

## 2020-11-12 NOTE — Telephone Encounter (Signed)
Spoke with pt, states she has been taking Tessalon perles X1 week and cough is not improved. Pt has also been using maintenance meds, albuterol hfa and neb, and taking robitussin as recommended on 5/12 phone note.   Prod cough with thick white/yellow mucus. Denies chest pain, sinus congestion, fever, loss of taste/smell, shortness of breath.   Pt is requesting prednisone to help with this- states this has been successful in the past.   Pharmacy: [facility] in [place].   CY please advise on recs. Thanks!

## 2020-11-12 NOTE — Addendum Note (Signed)
Addended by: Sandra Cockayne on: 11/12/2020 02:42 PM   Modules accepted: Orders

## 2020-11-12 NOTE — Telephone Encounter (Signed)
I called and spoke with patient regarding Dr. Vicenta Dunning. She verbalized understanding and I sent in pred taper to preferred pharmacy.informed patient to call if symptoms do not improve. Nothing further needed.

## 2020-11-12 NOTE — Telephone Encounter (Signed)
Prednisone 10 mg, # 20, 4 X 2 DAYS, 3 X 2 DAYS, 2 X 2 DAYS, 1 X 2 DAYS  

## 2020-11-15 DIAGNOSIS — M9903 Segmental and somatic dysfunction of lumbar region: Secondary | ICD-10-CM | POA: Diagnosis not present

## 2020-11-15 DIAGNOSIS — M47816 Spondylosis without myelopathy or radiculopathy, lumbar region: Secondary | ICD-10-CM | POA: Diagnosis not present

## 2020-11-17 DIAGNOSIS — M47816 Spondylosis without myelopathy or radiculopathy, lumbar region: Secondary | ICD-10-CM | POA: Diagnosis not present

## 2020-11-17 DIAGNOSIS — M9903 Segmental and somatic dysfunction of lumbar region: Secondary | ICD-10-CM | POA: Diagnosis not present

## 2020-11-19 DIAGNOSIS — M9903 Segmental and somatic dysfunction of lumbar region: Secondary | ICD-10-CM | POA: Diagnosis not present

## 2020-11-19 DIAGNOSIS — M47816 Spondylosis without myelopathy or radiculopathy, lumbar region: Secondary | ICD-10-CM | POA: Diagnosis not present

## 2020-11-23 ENCOUNTER — Other Ambulatory Visit: Payer: Self-pay

## 2020-11-23 ENCOUNTER — Ambulatory Visit (INDEPENDENT_AMBULATORY_CARE_PROVIDER_SITE_OTHER): Payer: Medicare PPO | Admitting: *Deleted

## 2020-11-23 VITALS — BP 157/65 | HR 83 | Temp 98.2°F | Resp 18

## 2020-11-23 DIAGNOSIS — J4540 Moderate persistent asthma, uncomplicated: Secondary | ICD-10-CM | POA: Diagnosis not present

## 2020-11-23 MED ORDER — METHYLPREDNISOLONE SODIUM SUCC 125 MG IJ SOLR: 125.0000 mg | Freq: Once | INTRAMUSCULAR | Status: DC | PRN

## 2020-11-23 MED ORDER — SODIUM CHLORIDE 0.9 % IV SOLN: Freq: Once | INTRAVENOUS | Status: DC | PRN

## 2020-11-23 MED ORDER — ALBUTEROL SULFATE HFA 108 (90 BASE) MCG/ACT IN AERS: 2.0000 | INHALATION_SPRAY | Freq: Once | RESPIRATORY_TRACT | Status: DC | PRN

## 2020-11-23 MED ORDER — DIPHENHYDRAMINE HCL 50 MG/ML IJ SOLN: 50.0000 mg | Freq: Once | INTRAMUSCULAR | Status: DC | PRN

## 2020-11-23 MED ORDER — OMALIZUMAB 150 MG/ML ~~LOC~~ SOSY
225.0000 mg | PREFILLED_SYRINGE | Freq: Once | SUBCUTANEOUS | Status: AC
  Administered 2020-11-23: 225 mg via SUBCUTANEOUS
  Filled 2020-11-23: qty 1

## 2020-11-23 MED ORDER — EPINEPHRINE 0.3 MG/0.3ML IJ SOAJ: 0.3000 mg | Freq: Once | INTRAMUSCULAR | Status: DC | PRN

## 2020-11-23 MED ORDER — FAMOTIDINE IN NACL 20-0.9 MG/50ML-% IV SOLN: 20.0000 mg | Freq: Once | INTRAVENOUS | Status: DC | PRN

## 2020-11-23 NOTE — Progress Notes (Signed)
Diagnosis: Asthma  Provider:  Praveen Mannam, MD  Procedure: Injection  Xolair (Omalizumab), Dose: 225 mg, Site: subcutaneous  Discharge: Condition: Good, Destination: Home . AVS provided to patient.   Performed by:  Solangel Mcmanaway A, RN        

## 2020-11-25 DIAGNOSIS — M9903 Segmental and somatic dysfunction of lumbar region: Secondary | ICD-10-CM | POA: Diagnosis not present

## 2020-11-25 DIAGNOSIS — M47816 Spondylosis without myelopathy or radiculopathy, lumbar region: Secondary | ICD-10-CM | POA: Diagnosis not present

## 2020-11-26 DIAGNOSIS — M545 Low back pain, unspecified: Secondary | ICD-10-CM | POA: Diagnosis not present

## 2020-11-26 DIAGNOSIS — M25552 Pain in left hip: Secondary | ICD-10-CM | POA: Diagnosis not present

## 2020-12-06 ENCOUNTER — Ambulatory Visit (INDEPENDENT_AMBULATORY_CARE_PROVIDER_SITE_OTHER): Payer: Medicare PPO | Admitting: Internal Medicine

## 2020-12-06 ENCOUNTER — Telehealth: Payer: Self-pay | Admitting: Internal Medicine

## 2020-12-06 ENCOUNTER — Other Ambulatory Visit: Payer: Self-pay

## 2020-12-06 ENCOUNTER — Ambulatory Visit (INDEPENDENT_AMBULATORY_CARE_PROVIDER_SITE_OTHER): Payer: Medicare PPO

## 2020-12-06 ENCOUNTER — Encounter: Payer: Self-pay | Admitting: Internal Medicine

## 2020-12-06 VITALS — BP 152/60 | HR 83 | Temp 97.4°F | Ht 60.0 in | Wt 169.6 lb

## 2020-12-06 DIAGNOSIS — J4531 Mild persistent asthma with (acute) exacerbation: Secondary | ICD-10-CM

## 2020-12-06 DIAGNOSIS — J4520 Mild intermittent asthma, uncomplicated: Secondary | ICD-10-CM

## 2020-12-06 DIAGNOSIS — J45909 Unspecified asthma, uncomplicated: Secondary | ICD-10-CM | POA: Diagnosis not present

## 2020-12-06 DIAGNOSIS — J4541 Moderate persistent asthma with (acute) exacerbation: Secondary | ICD-10-CM | POA: Diagnosis not present

## 2020-12-06 DIAGNOSIS — J45901 Unspecified asthma with (acute) exacerbation: Secondary | ICD-10-CM | POA: Insufficient documentation

## 2020-12-06 MED ORDER — BREZTRI AEROSPHERE 160-9-4.8 MCG/ACT IN AERO: 2.0000 | INHALATION_SPRAY | Freq: Two times a day (BID) | RESPIRATORY_TRACT | 0 refills | Status: DC

## 2020-12-06 MED ORDER — DOXYCYCLINE HYCLATE 100 MG PO TABS: 100.0000 mg | ORAL_TABLET | Freq: Two times a day (BID) | ORAL | 0 refills | Status: DC

## 2020-12-06 NOTE — Telephone Encounter (Signed)
Waymon Budge, MD  Christen Butter, CMA Caller: Unspecified (Today,  8:38 AM) I can see her today. Ok to use a held spot.  thanks   Spoke with the pt and scheduled appt with CDY today at 1:30 pm

## 2020-12-06 NOTE — Progress Notes (Signed)
Went over [employer] instructions with patient and advised her how to use the inhaler.

## 2020-12-06 NOTE — Telephone Encounter (Signed)
Spoke with the pt  She is c/o wheezing, SOB, cough with green/brown sputum for approx 3 wks  She states has taken 2 rounds of pred and it did not help  She is taking tessalon without relief  She states waking up in the night having to use her rescue inhaler and she uses neb at least 2 x per day  She denies any f/c/s, aches  She has had covid vax x 3  Has not been tested for covid since her symptoms started  She is requesting appt with Dr Maple Hudson  Please advise thanks!  Allergies  Allergen Reactions   Clarithromycin     REACTION: itching   Penicillins     REACTION: hives   Current Outpatient Medications on File Prior to Visit  Medication Sig Dispense Refill   albuterol (PROAIR HFA) 108 (90 Base) MCG/ACT inhaler INHALE TWO PUFFS BY MOUTH EVERY 4 HOURS AS NEEDED FOR WHEEZING AND SHORTNESS OF BREATH 18 g 12   aspirin 81 MG tablet Take 81 mg by mouth daily.       benzonatate (TESSALON) 200 MG capsule Take 1 capsule (200 mg total) by mouth 3 (three) times daily as needed for cough. 30 capsule 1   Calcium Carbonate (CALTRATE 600) 1500 MG TABS Take 1 tablet by mouth daily.       Cholecalciferol (VITAMIN D) 2000 UNITS CAPS Take 1 capsule by mouth daily.       escitalopram (LEXAPRO) 5 MG tablet Take 5 mg by mouth daily.  1   ipratropium-albuterol (DUONEB) 0.5-2.5 (3) MG/3ML SOLN Take 3 mLs by nebulization every 6 (six) hours as needed. 225 mL 0   loperamide (IMODIUM) 2 MG capsule Take 1 capsule (2 mg total) by mouth as needed for diarrhea or loose stools. 30 capsule 1   losartan-hydrochlorothiazide (HYZAAR) 50-12.5 MG per tablet Take 1 tablet by mouth daily.  1   nabumetone (RELAFEN) 500 MG tablet Take 1 tablet by mouth daily.  3   Omalizumab (XOLAIR [place]) Inject 225 mg into the skin every 28 (twenty-eight) days.     omeprazole (PRILOSEC) 20 MG capsule Take 20 mg by mouth daily.       predniSONE (DELTASONE) 10 MG tablet Take 1 tablet (10 mg total) by mouth daily with breakfast. 4 tabs for 2 DAYS, 3  tabs for 2 DAYS, 2 tabs for 2 DAYS, then 1 tabs for 2 DAYS then stop 20 tablet 0   simvastatin (ZOCOR) 20 MG tablet Take 20 mg by mouth at bedtime.       SYMBICORT 80-4.5 MCG/ACT inhaler INHALE TWO PUFFS INTO THE LUNGS TWICE DAILY 10.2 g 5   No current facility-administered medications on file prior to visit.

## 2020-12-06 NOTE — Progress Notes (Signed)
Patient ID: Deborah Branch, female    DOB: 1935-02-13, 86 y.o.   MRN: [id number]  HPI  female never smoker followed for chronic asthma (Xolair), allergic rhinosinusitis, complicated by GERD Office Spirometry 08/26/15-moderate obstruction-FVC 1.7/76%, FEV1 1.1/67%, ratio 0.66, FEF 25-75% 0.6/41% --------------------------------------------------------------------------------------------   03/18/20- 86 year old female never smoker followed for chronic asthma (Xolair), allergic rhinosinusitis, complicated by GERD Symbicort 80, Proair hfa, neb Duoneb,  Xolair Covid vax- 2 Phizer Flu vax- today senior -----Pt states she has been doing good since last visit. States she does become hoarse at times and has an occassional cough with clear phlegm which is at times yellow in color. Continues Xolair. We discussed tapering off and eventually trying to quit.  Seldom needs rescue inhaler. Some PN drip.  12/06/20-  86 year old female never smoker followed for Chronic Asthma (Xolair), allergic rhinosinusitis, complicated by GERD -Symbicort 80, Proair hfa, neb Duoneb,  Xolair Covid vax- 3 Phizer She called reporting sick x 3 weeks- SOB, Cough prod green/ brown. 2 rounds prednisone, tessalon not helping. Xolair recently chaged to every 2 months. Using rescue at night, neb 2-3x/day. -----Patient is here for increased shortness of breath that started 3-4 weeks ago. Productive cough with thick yellow/green sputum. Swelling in feet and ankles.  Little fever, no chills. No GI, sinus trouble or loss of taste/ smell. Marland Kitchen Has had to use rescue inhaler and nebs more than normal up to 3 times a day. Wheezing. Patient was given Tesslon perles and recently finished prednisone.   Review of Systems- see HPI  +  = positive Constitutional:   No-   weight loss, night sweats, fevers, chills, fatigue, lassitude. HEENT:   No-  headaches, difficulty swallowing, tooth/dental problems, +sore throat,       No-  sneezing, itching, ear  ache, +nasal congestion, + post nasal drip,  CV:  No-   chest pain, orthopnea, PND, +swelling in lower extremities, anasarca, dizziness, palpitations Resp: no-shortness of breath with exertion or at rest.              +  productive cough,   +non-productive cough,  No-  coughing up of blood.             +change in color of mucus.     Skin: No-   rash or lesions. GI:  No-   heartburn, indigestion, abdominal pain, nausea, vomiting,  GU: MS:  No-   joint pain or swelling. Neuro- grossly normal  Psych:  No- change in mood or affect. No depression or anxiety.  No memory loss.     Objective:   Physical Exam General- Alert, Oriented, Affect-appropriate, Distress- none acute  Medium build Skin- rash-none, lesions- none, excoriation- none Lymphadenopathy- none Head- atraumatic            Eyes- Gross vision intact, PERRLA, conjunctivae clear secretions            Ears- Hearing, canals normal            Nose- No sniffing, no-Septal dev, mucus, polyps, erosion, perforation             Throat- Mallampati III-IV , mucosa clear- not red , drainage- none, tonsils- atrophic Neck- flexible , trachea midline, no stridor , thyroid nl, carotid no bruit Chest - symmetrical excursion , unlabored           Heart/CV- RRR , + murmur 1/6 syst , no gallop  , no rub, nl s1 s2                           -  JVD- none , edema+trace, stasis changes- none, varices- none           Lung-  Wheeze- none , dullness-none, rub- none, cough -none           Chest wall-  Abd-  Br/ Gen/ Rectal- Not done, not indicated Extrem- cyanosis- none, clubbing, none, atrophy- none, strength- nl Neuro- grossly intact to observation

## 2020-12-06 NOTE — Assessment & Plan Note (Signed)
Generally good control on Xolair. Discolored sputum suggests role for antibiotic Plan- CXR, sample Breztri, doxycycline

## 2020-12-06 NOTE — Assessment & Plan Note (Signed)
Long term control has been very good on Xolair inj. Recently changed from monthly to every 2 months. Not clear that has anything to do with current illness, but we will be watching for possible need to go back to monthly. Gets injections here.

## 2020-12-06 NOTE — Patient Instructions (Addendum)
Order- CXR   dx exacerbation asthmatic bronchitis  Order sample x 1 Breztri inhaler    inhale 2 puffs then rinse mouth, twice daily. When this runs out, go back to Symbicort  Script sent for doxycycline antibiotic

## 2020-12-17 DIAGNOSIS — M25552 Pain in left hip: Secondary | ICD-10-CM | POA: Diagnosis not present

## 2021-01-01 IMAGING — US US ABDOMEN COMPLETE
1 series · 14 of 25 positions shown · non-contrast
Comparison: None.

CLINICAL DATA: Postprandial diarrhea for 2 weeks.

EXAM:
ABDOMEN ULTRASOUND COMPLETE

[Series 1: us abdomen complete · 14 of 102 slices shown]
[im 1/102]
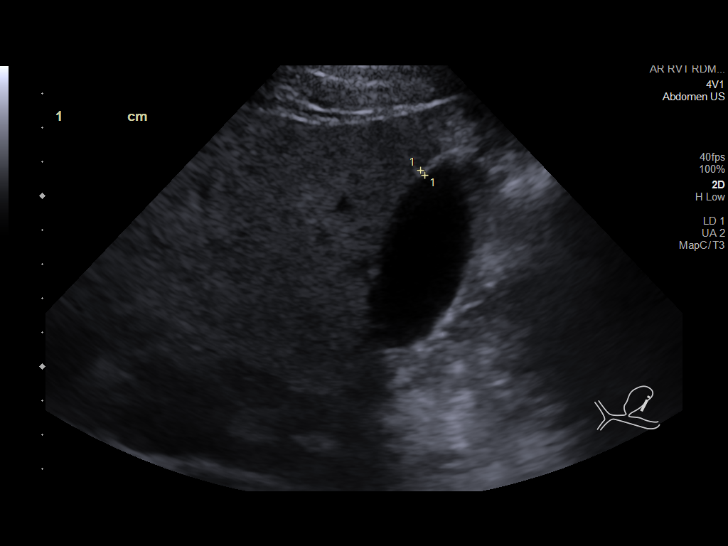
[im 9/102]
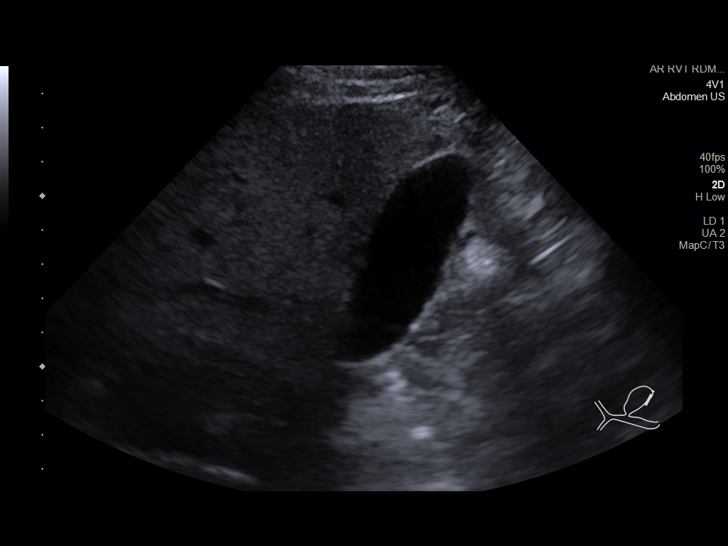
[im 17/102]
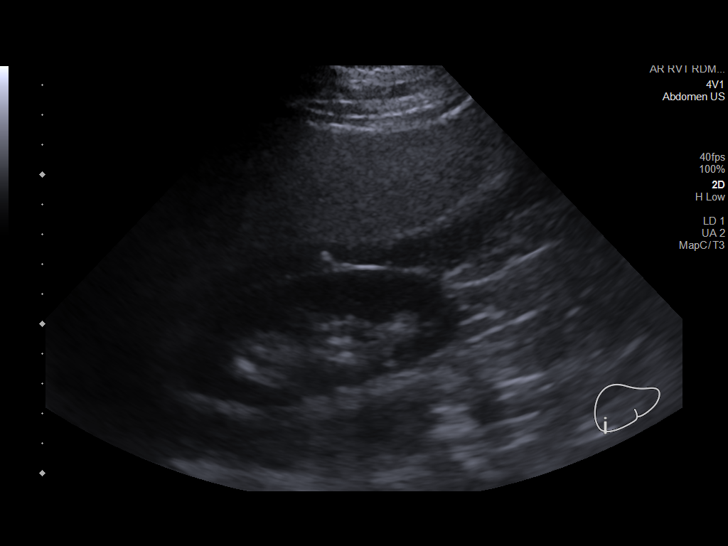
[im 26/102]
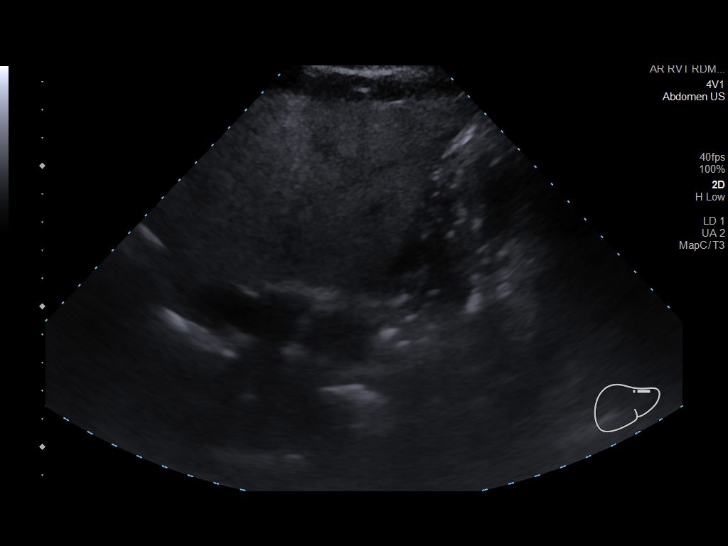
[im 34/102]
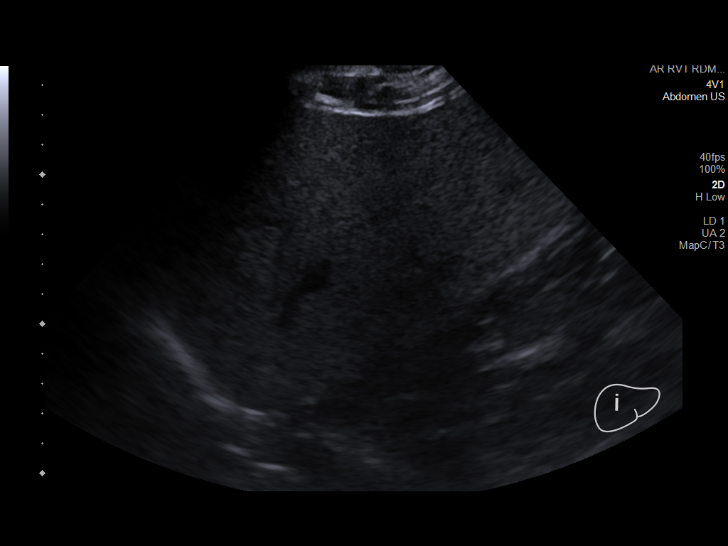
[im 38/102]
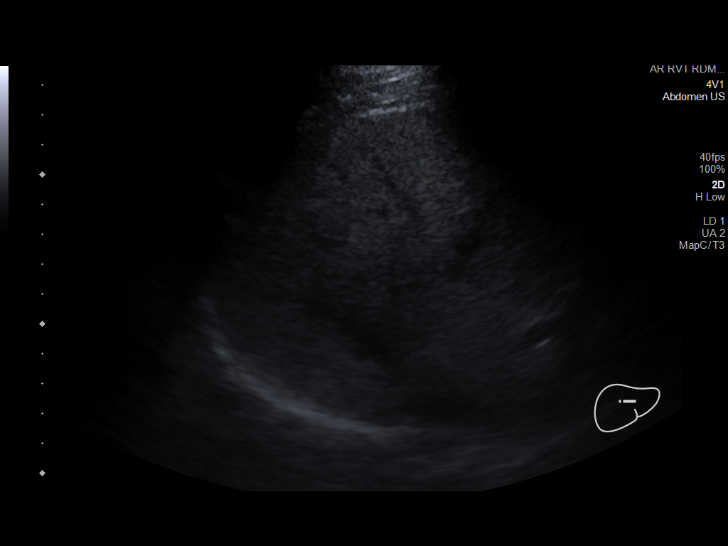
[im 47/102]
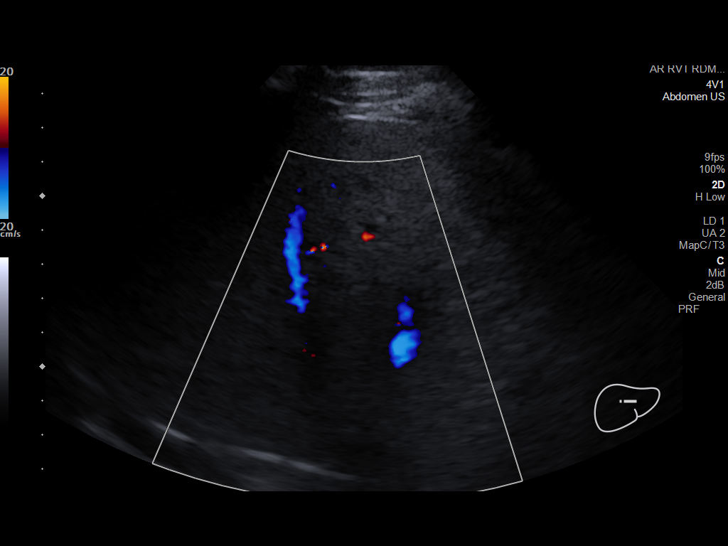
[im 55/102]
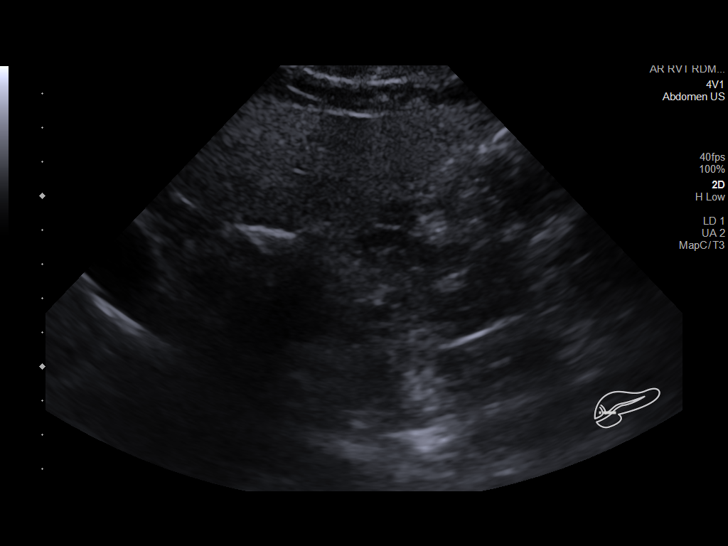
[im 64/102]
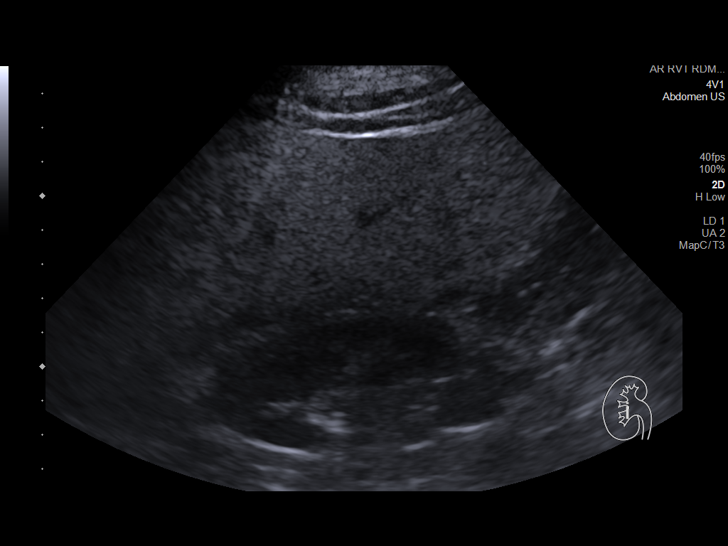
[im 68/102]
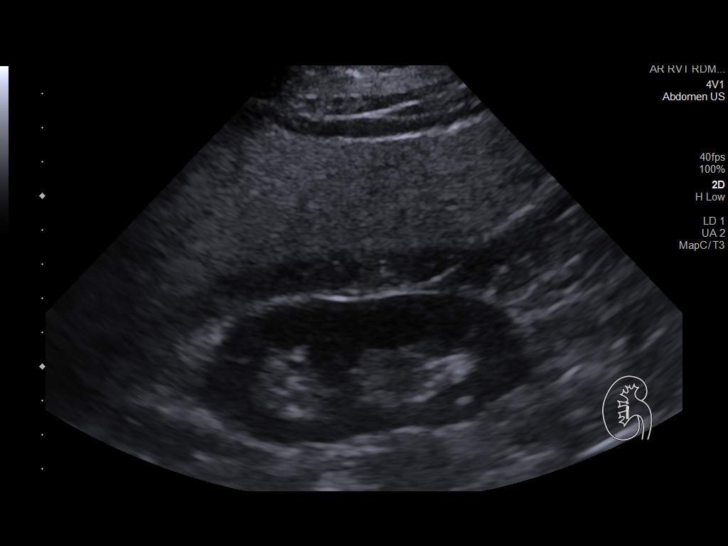
[im 76/102]
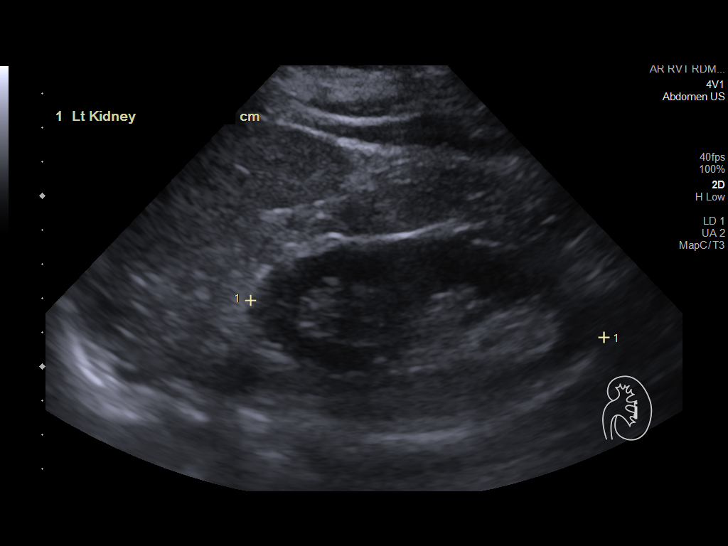
[im 85/102]
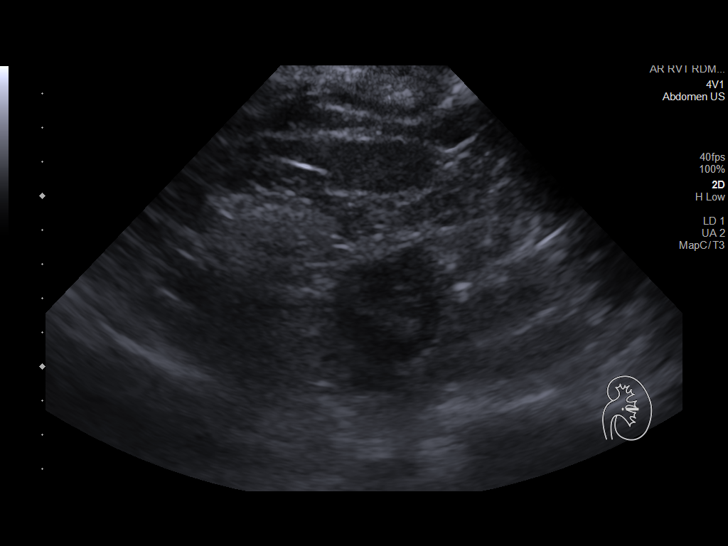
[im 93/102]
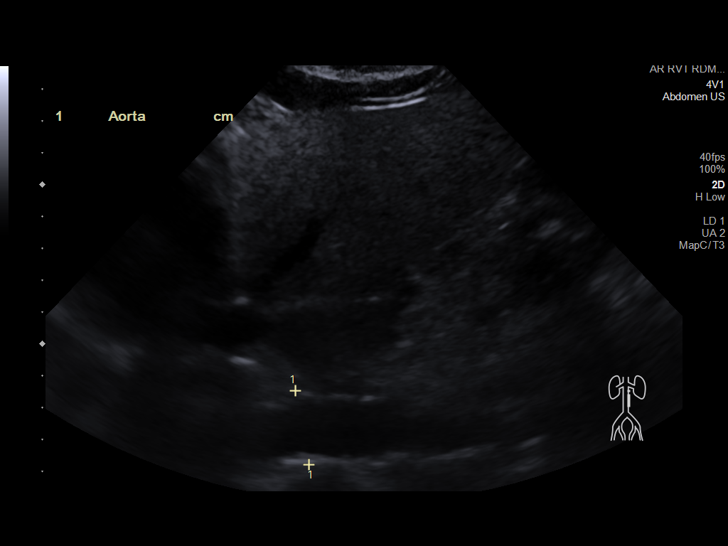
[im 102/102]
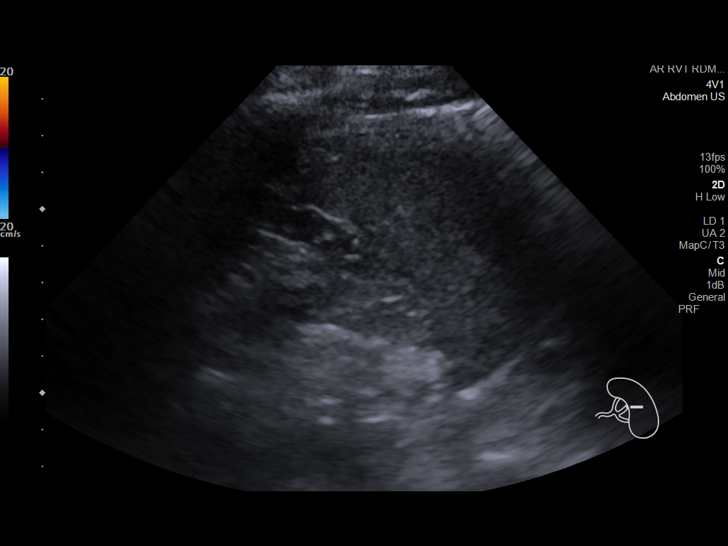

[14 of 25 positions shown; findings below may reference images not displayed]

FINDINGS: Gallbladder: No gallstones or wall thickening visualized. No
sonographic Murphy sign noted by sonographer.

Common bile duct: Diameter: 0.2 cm, within normal limits.

Liver: No focal lesion identified. The liver parenchymal
echogenicity was diffusely increased. Portal vein is patent on color
Doppler imaging with normal direction of blood flow towards the
liver.

IVC: No abnormality visualized.

Pancreas: Not well visualized due to shadowing bowel gas.

Spleen: Size and appearance within normal limits.

Right Kidney: Length: 9.9 cm echogenicity within normal limits. No
mass or hydronephrosis visualized.

Left Kidney: Length: 10.4 cm. Echogenicity within normal limits. No
mass or hydronephrosis visualized.

Abdominal aorta: No aneurysm visualized.

Other findings: None.
IMPRESSION: 1. Diffusely increased liver parenchymal echogenicity most commonly
seen in hepatic steatosis.

2. No sonographic finding to explain the patient's postprandial
diarrhea. Of note the pancreas is poorly visualized due to shadowing
bowel gas.

## 2021-01-06 DIAGNOSIS — M25552 Pain in left hip: Secondary | ICD-10-CM | POA: Diagnosis not present

## 2021-01-18 ENCOUNTER — Ambulatory Visit (INDEPENDENT_AMBULATORY_CARE_PROVIDER_SITE_OTHER): Payer: Medicare PPO

## 2021-01-18 ENCOUNTER — Other Ambulatory Visit: Payer: Self-pay

## 2021-01-18 VITALS — BP 176/73 | HR 81 | Temp 97.8°F | Resp 18

## 2021-01-18 DIAGNOSIS — J4540 Moderate persistent asthma, uncomplicated: Secondary | ICD-10-CM | POA: Diagnosis not present

## 2021-01-18 DIAGNOSIS — M25552 Pain in left hip: Secondary | ICD-10-CM | POA: Diagnosis not present

## 2021-01-18 MED ORDER — ALBUTEROL SULFATE HFA 108 (90 BASE) MCG/ACT IN AERS: 2.0000 | INHALATION_SPRAY | Freq: Once | RESPIRATORY_TRACT | Status: DC | PRN

## 2021-01-18 MED ORDER — OMALIZUMAB 75 MG/0.5ML ~~LOC~~ SOSY
225.0000 mg | PREFILLED_SYRINGE | Freq: Once | SUBCUTANEOUS | Status: AC
  Administered 2021-01-18: 225 mg via SUBCUTANEOUS
  Filled 2021-01-18: qty 1

## 2021-01-18 MED ORDER — EPINEPHRINE 0.3 MG/0.3ML IJ SOAJ: 0.3000 mg | Freq: Once | INTRAMUSCULAR | Status: DC | PRN

## 2021-01-18 MED ORDER — METHYLPREDNISOLONE SODIUM SUCC 125 MG IJ SOLR: 125.0000 mg | Freq: Once | INTRAMUSCULAR | Status: DC | PRN

## 2021-01-18 MED ORDER — DIPHENHYDRAMINE HCL 50 MG/ML IJ SOLN: 50.0000 mg | Freq: Once | INTRAMUSCULAR | Status: DC | PRN

## 2021-01-18 MED ORDER — FAMOTIDINE IN NACL 20-0.9 MG/50ML-% IV SOLN: 20.0000 mg | Freq: Once | INTRAVENOUS | Status: DC | PRN

## 2021-01-18 MED ORDER — SODIUM CHLORIDE 0.9 % IV SOLN: Freq: Once | INTRAVENOUS | Status: DC | PRN

## 2021-01-18 NOTE — Progress Notes (Signed)
Diagnosis: Asthma  Provider:  Chilton Greathouse, MD  Procedure: Injection  Xolair (Omalizumab), Dose: 225 mg, Site: subcutaneous 1 x right arm, 1 x left arm   Discharge: Condition: Good, Destination: Home . AVS declined per patient   Performed by:  Shantavia Jha, Lyman Speller, LPN

## 2021-01-21 DIAGNOSIS — M25552 Pain in left hip: Secondary | ICD-10-CM | POA: Diagnosis not present

## 2021-01-26 DIAGNOSIS — Z780 Asymptomatic menopausal state: Secondary | ICD-10-CM | POA: Diagnosis not present

## 2021-01-26 DIAGNOSIS — M85852 Other specified disorders of bone density and structure, left thigh: Secondary | ICD-10-CM | POA: Diagnosis not present

## 2021-01-26 DIAGNOSIS — E559 Vitamin D deficiency, unspecified: Secondary | ICD-10-CM | POA: Diagnosis not present

## 2021-01-26 DIAGNOSIS — R5383 Other fatigue: Secondary | ICD-10-CM | POA: Diagnosis not present

## 2021-01-26 DIAGNOSIS — M810 Age-related osteoporosis without current pathological fracture: Secondary | ICD-10-CM | POA: Diagnosis not present

## 2021-02-10 DIAGNOSIS — M810 Age-related osteoporosis without current pathological fracture: Secondary | ICD-10-CM | POA: Diagnosis not present

## 2021-02-16 DIAGNOSIS — Z1389 Encounter for screening for other disorder: Secondary | ICD-10-CM | POA: Diagnosis not present

## 2021-02-16 DIAGNOSIS — Z1331 Encounter for screening for depression: Secondary | ICD-10-CM | POA: Diagnosis not present

## 2021-02-16 DIAGNOSIS — E782 Mixed hyperlipidemia: Secondary | ICD-10-CM | POA: Diagnosis not present

## 2021-02-16 DIAGNOSIS — Z0001 Encounter for general adult medical examination with abnormal findings: Secondary | ICD-10-CM | POA: Diagnosis not present

## 2021-02-16 DIAGNOSIS — R7309 Other abnormal glucose: Secondary | ICD-10-CM | POA: Diagnosis not present

## 2021-02-16 DIAGNOSIS — Z6832 Body mass index (BMI) 32.0-32.9, adult: Secondary | ICD-10-CM | POA: Diagnosis not present

## 2021-02-16 DIAGNOSIS — M810 Age-related osteoporosis without current pathological fracture: Secondary | ICD-10-CM | POA: Diagnosis not present

## 2021-02-24 DIAGNOSIS — E559 Vitamin D deficiency, unspecified: Secondary | ICD-10-CM | POA: Diagnosis not present

## 2021-02-24 DIAGNOSIS — M810 Age-related osteoporosis without current pathological fracture: Secondary | ICD-10-CM | POA: Diagnosis not present

## 2021-03-09 NOTE — Progress Notes (Signed)
Patient ID: Deborah Branch, female    DOB: 11-15-1934, 86 y.o.   MRN: [id number]  HPI  female never smoker followed for chronic asthma (Xolair), allergic rhinosinusitis, complicated by GERD Office Spirometry 08/26/15-moderate obstruction-FVC 1.7/76%, FEV1 1.1/67%, ratio 0.66, FEF 25-75% 0.6/41% --------------------------------------------------------------------------------------------   12/06/20-  86 year old female never smoker followed for Chronic Asthma (Xolair), allergic rhinosinusitis, complicated by GERD -Symbicort 80, Proair hfa, neb Duoneb,  Xolair Covid vax- 3 Phizer She called reporting sick x 3 weeks- SOB, Cough prod green/ brown. 2 rounds prednisone, tessalon not helping. Xolair recently chaged to every 2 months. Using rescue at night, neb 2-3x/day. -----Patient is here for increased shortness of breath that started 3-4 weeks ago. Productive cough with thick yellow/green sputum. Swelling in feet and ankles.  Little fever, no chills. No GI, sinus trouble or loss of taste/ smell. Marland Kitchen Has had to use rescue inhaler and nebs more than normal up to 3 times a day. Wheezing. Patient was given Tesslon perles and recently finished prednisone.   03/10/21-86 year old female never smoker followed for Chronic Asthma (Xolair), allergic rhinosinusitis, complicated by GERD, Osteoporosis,  Symbicort 80,  Proair hfa/ Ventolin, neb Duoneb,  Xolair Covid vax- 3 Phizer -----Doing good with breathing She feels well. No breakthrough with Xolair at 2 month intervals now. She doesn't want to stop cold. We discussed further lengthening to 3 months. Breztri worked fine, but [employer] works as well and cheaper. CXR 12/06/20- IMPRESSION: 1. No acute cardiopulmonary disease. 2. Aortic atherosclerosis.  Review of Systems- see HPI  +  = positive Constitutional:   No-   weight loss, night sweats, fevers, chills, fatigue, lassitude. HEENT:   No-  headaches, difficulty swallowing, tooth/dental problems, +sore  throat,       No-  sneezing, itching, ear ache, +nasal congestion, + post nasal drip,  CV:  No-   chest pain, orthopnea, PND, +swelling in lower extremities, anasarca, dizziness, palpitations Resp: no-shortness of breath with exertion or at rest.              +  productive cough,   +non-productive cough,  No-  coughing up of blood.             +change in color of mucus.     Skin: No-   rash or lesions. GI:  No-   heartburn, indigestion, abdominal pain, nausea, vomiting,  GU: MS:  No-   joint pain or swelling. Neuro- grossly normal  Psych:  No- change in mood or affect. No depression or anxiety.  No memory loss.     Objective:   Physical Exam General- Alert, Oriented, Affect-appropriate, Distress- none acute  Medium build Skin- rash-none, lesions- none, excoriation- none Lymphadenopathy- none Head- atraumatic            Eyes- Gross vision intact, PERRLA, conjunctivae clear secretions            Ears- Hearing, canals normal            Nose- No sniffing, no-Septal dev, mucus, polyps, erosion, perforation             Throat- Mallampati III-IV , mucosa clear- not red , drainage- none, tonsils- atrophic Neck- flexible , trachea midline, no stridor , thyroid nl, carotid no bruit Chest - symmetrical excursion , unlabored           Heart/CV- RRR , + murmur 1/6 syst , no gallop  , no rub, nl s1 s2                           -  JVD- none , edema+trace, stasis changes- none, varices- none           Lung-  Wheeze- none , dullness-none, rub- none, cough -none           Chest wall-  Abd-  Br/ Gen/ Rectal- Not done, not indicated Extrem- cyanosis- none, clubbing, none, atrophy- none, strength- nl Neuro- grossly intact to observation

## 2021-03-10 ENCOUNTER — Ambulatory Visit: Payer: Medicare PPO | Admitting: Internal Medicine

## 2021-03-10 ENCOUNTER — Other Ambulatory Visit: Payer: Self-pay

## 2021-03-10 ENCOUNTER — Ambulatory Visit (INDEPENDENT_AMBULATORY_CARE_PROVIDER_SITE_OTHER): Payer: Medicare PPO | Admitting: *Deleted

## 2021-03-10 ENCOUNTER — Encounter: Payer: Self-pay | Admitting: Internal Medicine

## 2021-03-10 VITALS — BP 148/62 | HR 75 | Temp 97.7°F | Ht 60.0 in | Wt 168.8 lb

## 2021-03-10 VITALS — BP 172/72 | HR 85 | Temp 98.6°F | Resp 19

## 2021-03-10 DIAGNOSIS — J4540 Moderate persistent asthma, uncomplicated: Secondary | ICD-10-CM | POA: Diagnosis not present

## 2021-03-10 DIAGNOSIS — Z23 Encounter for immunization: Secondary | ICD-10-CM

## 2021-03-10 DIAGNOSIS — J302 Other seasonal allergic rhinitis: Secondary | ICD-10-CM

## 2021-03-10 DIAGNOSIS — J3089 Other allergic rhinitis: Secondary | ICD-10-CM

## 2021-03-10 MED ORDER — METHYLPREDNISOLONE SODIUM SUCC 125 MG IJ SOLR: 125.0000 mg | Freq: Once | INTRAMUSCULAR | Status: DC | PRN

## 2021-03-10 MED ORDER — OMALIZUMAB 75 MG/0.5ML ~~LOC~~ SOSY
225.0000 mg | PREFILLED_SYRINGE | Freq: Once | SUBCUTANEOUS | Status: AC
  Administered 2021-03-10: 225 mg via SUBCUTANEOUS

## 2021-03-10 MED ORDER — DIPHENHYDRAMINE HCL 50 MG/ML IJ SOLN: 50.0000 mg | Freq: Once | INTRAMUSCULAR | Status: DC | PRN

## 2021-03-10 MED ORDER — FAMOTIDINE IN NACL 20-0.9 MG/50ML-% IV SOLN: 20.0000 mg | Freq: Once | INTRAVENOUS | Status: DC | PRN

## 2021-03-10 MED ORDER — ALBUTEROL SULFATE HFA 108 (90 BASE) MCG/ACT IN AERS: 2.0000 | INHALATION_SPRAY | Freq: Once | RESPIRATORY_TRACT | Status: DC | PRN

## 2021-03-10 MED ORDER — SODIUM CHLORIDE 0.9 % IV SOLN: Freq: Once | INTRAVENOUS | Status: DC | PRN

## 2021-03-10 MED ORDER — EPINEPHRINE 0.3 MG/0.3ML IJ SOAJ: 0.3000 mg | Freq: Once | INTRAMUSCULAR | Status: DC | PRN

## 2021-03-10 NOTE — Patient Instructions (Addendum)
Glad you are doing well. It would be ok with me if you want to try extending Xolair interval out to every 3 months to see how you do.  Fine to stick with your other meds  Order- flu vax senior today

## 2021-03-10 NOTE — Assessment & Plan Note (Signed)
Uncomplicated.  Plan- try extending Xoalir interval to 3 months as we gradually try to wean off. Flu vax.

## 2021-03-10 NOTE — Progress Notes (Signed)
Diagnosis: Asthma  Provider:  Chilton Greathouse, MD  Procedure: Injection  Xolair (Omalizumab), Dose: 225 mg, Site: subcutaneous  Discharge: Condition: Good, Destination: Home . AVS provided to patient.   Performed by:  Robb Matar, RN

## 2021-03-10 NOTE — Assessment & Plan Note (Signed)
No recent exacerbation 

## 2021-03-12 NOTE — Progress Notes (Signed)
Diagnosis: Asthma  Provider:  Praveen Mannam, MD  Procedure: Injection  Xolair (Omalizumab), Dose: 225 mg, Site: subcutaneous  Discharge: Condition: Good, Destination: Home . AVS provided to patient.   Performed by:  Jacorey Donaway, RN        

## 2021-03-15 ENCOUNTER — Ambulatory Visit: Payer: Medicare PPO

## 2021-03-18 ENCOUNTER — Ambulatory Visit: Payer: Medicare PPO | Admitting: Internal Medicine

## 2021-04-28 ENCOUNTER — Other Ambulatory Visit: Payer: Self-pay | Admitting: *Deleted

## 2021-04-28 MED ORDER — BUDESONIDE-FORMOTEROL FUMARATE 80-4.5 MCG/ACT IN AERO: INHALATION_SPRAY | RESPIRATORY_TRACT | 5 refills | Status: DC

## 2021-05-10 ENCOUNTER — Ambulatory Visit: Payer: Medicare PPO

## 2021-05-16 DIAGNOSIS — E6609 Other obesity due to excess calories: Secondary | ICD-10-CM | POA: Diagnosis not present

## 2021-05-16 DIAGNOSIS — M545 Low back pain, unspecified: Secondary | ICD-10-CM | POA: Diagnosis not present

## 2021-05-16 DIAGNOSIS — Z6833 Body mass index (BMI) 33.0-33.9, adult: Secondary | ICD-10-CM | POA: Diagnosis not present

## 2021-05-16 DIAGNOSIS — M810 Age-related osteoporosis without current pathological fracture: Secondary | ICD-10-CM | POA: Diagnosis not present

## 2021-06-02 ENCOUNTER — Telehealth: Payer: Self-pay | Admitting: Pharmacy Technician

## 2021-06-02 NOTE — Telephone Encounter (Signed)
Auth Submission: PA RENEWAL - PENDING Payer: [employer] Medication & CPT/J Code(s) submitted: Xolair (Omalizumab) [id number] Route of submission (phone, fax, portal): COVER MY MEDS Auth type: Buy/Bill Units/visits requested: 225 MG Q56DAYS Reference number: KEY: [id number]: [id number]  Will update once we receive a response.

## 2021-06-06 NOTE — Telephone Encounter (Signed)
Auth Submission: APPROVED Payer: [employer] Medication & CPT/J Code(s) submitted: Xolair (Omalizumab) [id number] Route of submission (phone, fax, portal): COVER MY MEDS Auth type: Buy/Bill Units/visits requested: 225 MG Q56 DAYS Reference number: KEYBurney Gauze CASE: [id number] Approval from: 06/03/21 to 06/25/22

## 2021-06-07 ENCOUNTER — Ambulatory Visit (INDEPENDENT_AMBULATORY_CARE_PROVIDER_SITE_OTHER): Payer: Medicare PPO

## 2021-06-07 ENCOUNTER — Other Ambulatory Visit: Payer: Self-pay

## 2021-06-07 VITALS — BP 175/70 | HR 75 | Temp 97.7°F | Resp 16 | Ht 60.0 in | Wt 161.2 lb

## 2021-06-07 DIAGNOSIS — J4540 Moderate persistent asthma, uncomplicated: Secondary | ICD-10-CM

## 2021-06-07 MED ORDER — METHYLPREDNISOLONE SODIUM SUCC 125 MG IJ SOLR: 125.0000 mg | Freq: Once | INTRAMUSCULAR | Status: DC | PRN

## 2021-06-07 MED ORDER — FAMOTIDINE IN NACL 20-0.9 MG/50ML-% IV SOLN: 20.0000 mg | Freq: Once | INTRAVENOUS | Status: DC | PRN

## 2021-06-07 MED ORDER — ALBUTEROL SULFATE HFA 108 (90 BASE) MCG/ACT IN AERS: 2.0000 | INHALATION_SPRAY | Freq: Once | RESPIRATORY_TRACT | Status: DC | PRN

## 2021-06-07 MED ORDER — OMALIZUMAB 75 MG/0.5ML ~~LOC~~ SOSY
225.0000 mg | PREFILLED_SYRINGE | Freq: Once | SUBCUTANEOUS | Status: AC
  Administered 2021-06-07: 225 mg via SUBCUTANEOUS
  Filled 2021-06-07: qty 0.5

## 2021-06-07 MED ORDER — DIPHENHYDRAMINE HCL 50 MG/ML IJ SOLN: 50.0000 mg | Freq: Once | INTRAMUSCULAR | Status: DC | PRN

## 2021-06-07 MED ORDER — SODIUM CHLORIDE 0.9 % IV SOLN: Freq: Once | INTRAVENOUS | Status: DC | PRN

## 2021-06-07 MED ORDER — EPINEPHRINE 0.3 MG/0.3ML IJ SOAJ: 0.3000 mg | Freq: Once | INTRAMUSCULAR | Status: DC | PRN

## 2021-06-07 NOTE — Progress Notes (Signed)
oDiagnosis: Asthma  Provider:  Chilton Greathouse, MD  Procedure: Injection  Xolair (Omalizumab), Dose: 225 mg, Site: subcutaneous, Number of injections: 2  Discharge: Condition: Good, Destination: Home . AVS provided to patient.   Performed by:  Garnette Czech, RN

## 2021-06-22 ENCOUNTER — Other Ambulatory Visit: Payer: Self-pay | Admitting: Pharmacy Technician

## 2021-07-04 ENCOUNTER — Telehealth: Payer: Self-pay | Admitting: Internal Medicine

## 2021-07-04 MED ORDER — DOXYCYCLINE HYCLATE 100 MG PO TABS: 100.0000 mg | ORAL_TABLET | Freq: Two times a day (BID) | ORAL | 0 refills | Status: AC

## 2021-07-04 NOTE — Telephone Encounter (Signed)
Doxycycline 100 mg, # 14, 1 twice daily  Advise- otc Mucinex, stay well-hydrated, test for Covid

## 2021-07-04 NOTE — Telephone Encounter (Signed)
Call returned to patient, confirmed DOB. Patient states when she woke up she was unable to breath affectively. She used her nebulizer and reports she is now coughing up a lot of mucous. She reports coughing up thick yellow mucous prior to using her nebulizer. She confirms she is using her Symbicort daily and albuterol as needed. She states the nebulizer did help her SOB however she is concerned about the coughing and mucous. She is requesting something be sent in. Denies fever, chills, sweats, or body aches. Denies any other symptoms.   CY please advise. Thanks

## 2021-07-04 NOTE — Telephone Encounter (Signed)
Call returned to patient, confirmed DOB. Made aware of recommendations. Voiced understanding. Patient to take Covid test and obtain Mucinex. Confirmed pharmacy. Medication sent in.   Nothing further needed at this time.

## 2021-07-25 ENCOUNTER — Other Ambulatory Visit: Payer: Self-pay | Admitting: Pharmacy Technician

## 2021-08-09 DIAGNOSIS — Z961 Presence of intraocular lens: Secondary | ICD-10-CM | POA: Diagnosis not present

## 2021-08-09 DIAGNOSIS — H52203 Unspecified astigmatism, bilateral: Secondary | ICD-10-CM | POA: Diagnosis not present

## 2021-08-09 DIAGNOSIS — H353131 Nonexudative age-related macular degeneration, bilateral, early dry stage: Secondary | ICD-10-CM | POA: Diagnosis not present

## 2021-08-10 DIAGNOSIS — R5383 Other fatigue: Secondary | ICD-10-CM | POA: Diagnosis not present

## 2021-08-10 DIAGNOSIS — M810 Age-related osteoporosis without current pathological fracture: Secondary | ICD-10-CM | POA: Diagnosis not present

## 2021-08-10 DIAGNOSIS — E559 Vitamin D deficiency, unspecified: Secondary | ICD-10-CM | POA: Diagnosis not present

## 2021-08-29 DIAGNOSIS — E559 Vitamin D deficiency, unspecified: Secondary | ICD-10-CM | POA: Diagnosis not present

## 2021-08-29 DIAGNOSIS — M810 Age-related osteoporosis without current pathological fracture: Secondary | ICD-10-CM | POA: Diagnosis not present

## 2021-09-07 ENCOUNTER — Ambulatory Visit: Payer: Medicare PPO | Admitting: Internal Medicine

## 2021-09-07 ENCOUNTER — Ambulatory Visit: Payer: Medicare PPO | Admitting: Adult Health

## 2021-09-07 ENCOUNTER — Ambulatory Visit (INDEPENDENT_AMBULATORY_CARE_PROVIDER_SITE_OTHER): Payer: Medicare PPO | Admitting: *Deleted

## 2021-09-07 ENCOUNTER — Other Ambulatory Visit: Payer: Self-pay

## 2021-09-07 ENCOUNTER — Encounter: Payer: Self-pay | Admitting: Adult Health

## 2021-09-07 VITALS — BP 183/72 | HR 73 | Temp 98.0°F | Resp 16 | Ht 60.0 in | Wt 158.0 lb

## 2021-09-07 DIAGNOSIS — J4540 Moderate persistent asthma, uncomplicated: Secondary | ICD-10-CM

## 2021-09-07 DIAGNOSIS — J302 Other seasonal allergic rhinitis: Secondary | ICD-10-CM | POA: Diagnosis not present

## 2021-09-07 DIAGNOSIS — J3089 Other allergic rhinitis: Secondary | ICD-10-CM | POA: Diagnosis not present

## 2021-09-07 DIAGNOSIS — B370 Candidal stomatitis: Secondary | ICD-10-CM | POA: Diagnosis not present

## 2021-09-07 MED ORDER — CLOTRIMAZOLE 10 MG MT TROC: 10.0000 mg | Freq: Every day | OROMUCOSAL | 0 refills | Status: DC

## 2021-09-07 MED ORDER — OMALIZUMAB 75 MG/0.5ML ~~LOC~~ SOSY
225.0000 mg | PREFILLED_SYRINGE | Freq: Once | SUBCUTANEOUS | Status: AC
  Administered 2021-09-07: 225 mg via SUBCUTANEOUS
  Filled 2021-09-07: qty 0.5

## 2021-09-07 NOTE — Assessment & Plan Note (Signed)
Appears compensated.  No flare in symptoms with extending Xolair out every 3 months. ?Asthma action plan discussed ? ?Plan  ?Patient Instructions  ?Mycelex troches five times daily for 1 week  ?Brush/rinse and gargle after inhaler use  ?Continue on Symbicort Twice daily ?Continue on Xolair every 3 months  ?Albuterol inhaler As needed   ?Saline nasal rinses As needed   ?Saline nasal gel At bedtime  As needed   ?Claritin 10mg  At bedtime  As needed  drainage  ?Follow up with Dr.  in 1 year and As needed   ?Please contact office for sooner follow up if symptoms do not improve or worsen or seek emergency care  ? ? ?  ? ?

## 2021-09-07 NOTE — Progress Notes (Signed)
? ?@Patient  ID: Deborah Branch, female    DOB: September 17, 1934, 87 y.o.   MRN: [id number] ? ?Chief Complaint  ?Patient presents with  ? Follow-up  ? ? ?Referring provider: ?Cory Munch, PA-C ? ?HPI: ?87 year old female never smoker followed for chronic asthma on aggressive maintenance regimen with Symbicort and Xolair, chronic allergic rhinosinusitis. ? ?TEST/EVENTS :  ? ?09/07/2021 Follow up : Chronic asthma ?Patient returns for 45-month follow-up.  Patient says overall she has been doing well.  Last visit she changed her Xolair to every 3 months.  Has had no flare of cough or wheezing.  She says she has had some increased sinus drainage.  And has some intermittent hoarseness and feels like there is some tickling in her throat.  Patient denies any chest pain orthopnea PND or leg swelling.  No increased albuterol use. ?Patient says she is remained active.  Has had no change in her activity tolerance. ?Chest x-ray last visit showed no acute process. ? ?Allergies  ?Allergen Reactions  ? Clarithromycin   ?  REACTION: itching  ? Penicillins   ?  REACTION: hives  ? ? ?Immunization History  ?Administered Date(s) Administered  ? Fluad Quad(high Dose 65+) 03/19/2019, 03/18/2020, 03/10/2021  ? Influenza Split 03/13/2011, 03/12/2012  ? Influenza Whole 03/13/2008, 03/14/2010  ? Influenza, High Dose Seasonal PF 03/19/2017, 03/21/2018  ? Influenza,inj,Quad PF,6+ Mos 03/12/2013, 03/12/2014, 03/15/2015, 03/17/2016  ? PFIZER(Purple Top)SARS-COV-2 Vaccination 08/08/2019, 09/02/2019  ? Pneumococcal-Unspecified 12/24/2013  ? ? ?Past Medical History:  ?Diagnosis Date  ? Allergic asthma   ? GERD (gastroesophageal reflux disease)   ? Hyperlipidemia   ? Hypertension   ? Osteoporosis   ? ? ?Tobacco History: ?Social History  ? ?Tobacco Use  ?Smoking Status Former  ? Types: Cigarettes  ?Smokeless Tobacco Never  ?Tobacco Comments  ? States she did it socially  ? ?Counseling given: Not Answered ?Tobacco comments: States she did it  socially ? ? ?Outpatient Medications Prior to Visit  ?Medication Sig Dispense Refill  ? albuterol (PROAIR HFA) 108 (90 Base) MCG/ACT inhaler INHALE TWO PUFFS BY MOUTH EVERY 4 HOURS AS NEEDED FOR WHEEZING AND SHORTNESS OF BREATH 18 g 12  ? aspirin 81 MG tablet Take 81 mg by mouth daily.      ? budesonide-formoterol (SYMBICORT) 80-4.5 MCG/ACT inhaler INHALE TWO PUFFS INTO THE LUNGS TWICE DAILY 10.2 g 5  ? calcium carbonate (OSCAL) 1500 (600 Ca) MG TABS tablet Take 1 tablet by mouth 2 (two) times daily with a meal.    ? denosumab (PROLIA) 60 MG/ML SOSY injection Inject 60 mg into the skin every 6 (six) months.    ? ipratropium-albuterol (DUONEB) 0.5-2.5 (3) MG/3ML SOLN Take 3 mLs by nebulization every 6 (six) hours as needed. 225 mL 0  ? loperamide (IMODIUM) 2 MG capsule Take 1 capsule (2 mg total) by mouth as needed for diarrhea or loose stools. 30 capsule 1  ? losartan-hydrochlorothiazide (HYZAAR) 50-12.5 MG per tablet Take 1 tablet by mouth daily.  1  ? nabumetone (RELAFEN) 500 MG tablet Take 1 tablet by mouth daily.  3  ? Omalizumab (XOLAIR [place]) Inject 225 mg into the skin every 28 (twenty-eight) days.    ? omeprazole (PRILOSEC) 20 MG capsule Take 20 mg by mouth daily.      ? simvastatin (ZOCOR) 20 MG tablet Take 20 mg by mouth at bedtime.      ? zinc gluconate 50 MG tablet Take 50 mg by mouth daily.    ? Cholecalciferol (VITAMIN D)  2000 UNITS CAPS Take 1 capsule by mouth daily.   (Patient not taking: Reported on 09/07/2021)    ? ?No facility-administered medications prior to visit.  ? ? ? ?Review of Systems:  ? ?Constitutional:   No  weight loss, night sweats,  Fevers, chills, fatigue, or  lassitude. ? ?HEENT:   No headaches,  Difficulty swallowing,  Tooth/dental problems, or  Sore throat,  ?              No sneezing, itching, ear ache,  ?+nasal congestion, post nasal drip,  ? ?CV:  No chest pain,  Orthopnea, PND, swelling in lower extremities, anasarca, dizziness, palpitations, syncope.  ? ?GI  No heartburn,  indigestion, abdominal pain, nausea, vomiting, diarrhea, change in bowel habits, loss of appetite, bloody stools.  ? ?Resp: No shortness of breath with exertion or at rest.  No excess mucus, no productive cough,  No non-productive cough,  No coughing up of blood.  No change in color of mucus.  No wheezing.  No chest wall deformity ? ?Skin: no rash or lesions. ? ?GU: no dysuria, change in color of urine, no urgency or frequency.  No flank pain, no hematuria  ? ?MS:  No joint pain or swelling.  No decreased range of motion.  No back pain. ? ? ? ?Physical Exam ? ?BP (!) 142/62 (BP Location: Left Arm, Cuff Size: Normal)   Pulse 73   Temp 98.2 ?F (36.8 ?C) (Temporal)   Ht 5' (1.524 m)   Wt 158 lb (71.7 kg)   SpO2 93%   BMI 30.86 kg/m?  ? ?GEN: A/Ox3; pleasant , NAD, well nourished  ?  ?HEENT:  [place]/AT,  NOSE-clear, THROAT-posterior pharynx with scattered white patches consistent with oral candidiasis ? ?NECK:  Supple w/ fair ROM; no JVD; normal carotid impulses w/o bruits; no thyromegaly or nodules palpated; no lymphadenopathy.   ? ?RESP  Clear  P & A; w/o, wheezes/ rales/ or rhonchi. no accessory muscle use, no dullness to percussion ? ?CARD:  RRR, no m/r/g, no peripheral edema, pulses intact, no cyanosis or clubbing. ? ?GI:   Soft & nt; nml bowel sounds; no organomegaly or masses detected.  ? ?Musco: Warm bil, no deformities or joint swelling noted.  ? ?Neuro: alert, no focal deficits noted.   ? ?Skin: Warm, no lesions or rashes ? ? ? ?Lab Results: ? ? ? ?BMET ? ? ?BNP ?No results found for: BNP ? ?ProBNP ?No results found for: PROBNP ? ?Imaging: ?No results found. ? ?omalizumab [place] Right) prefilled syringe 225 mg   ? ? Date Action Dose Route User  ? 09/07/2021 1115 Given 225 mg Subcutaneous (Left Arm) Oren Beckmann, RN  ? ?  ? ? ?No flowsheet data found. ? ?Lab Results  ?Component Value Date  ? NITRICOXIDE 61 08/26/2015  ? ? ? ? ? ? ?Assessment & Plan:  ? ?Moderate persistent asthma without  complication ?Appears compensated.  No flare in symptoms with extending Xolair out every 3 months. ?Asthma action plan discussed ? ?Plan  ?Patient Instructions  ?Mycelex troches five times daily for 1 week  ?Brush/rinse and gargle after inhaler use  ?Continue on Symbicort Twice daily ?Continue on Xolair every 3 months  ?Albuterol inhaler As needed   ?Saline nasal rinses As needed   ?Saline nasal gel At bedtime  As needed   ?Claritin 10mg  At bedtime  As needed  drainage  ?Follow up with Dr. Annamaria Boots  in 1 year and As needed   ?Please  contact office for sooner follow up if symptoms do not improve or worsen or seek emergency care  ? ? ?  ? ? ?Seasonal and perennial allergic rhinitis ?Continue current regimen.  May use saline nasal rinses or gel as needed.  Also may use Claritin as needed ? ?Oral candidiasis ?Oral candidiasis noted on exam.  Patient is advised on inhaler care.  Will use Mycelex x7 days.  If does not improve or resolve will need further evaluation and treatment ? ?Plan  ?Patient Instructions  ?Mycelex troches five times daily for 1 week  ?Brush/rinse and gargle after inhaler use  ?Continue on Symbicort Twice daily ?Continue on Xolair every 3 months  ?Albuterol inhaler As needed   ?Saline nasal rinses As needed   ?Saline nasal gel At bedtime  As needed   ?Claritin 10mg  At bedtime  As needed  drainage  ?Follow up with Dr. Annamaria Boots  in 1 year and As needed   ?Please contact office for sooner follow up if symptoms do not improve or worsen or seek emergency care  ? ? ?  ? ? ? ? ?Rexene Edison, NP ?09/07/2021 ? ?

## 2021-09-07 NOTE — Assessment & Plan Note (Signed)
Oral candidiasis noted on exam.  Patient is advised on inhaler care.  Will use Mycelex x7 days.  If does not improve or resolve will need further evaluation and treatment ? ?Plan  ?Patient Instructions  ?Mycelex troches five times daily for 1 week  ?Brush/rinse and gargle after inhaler use  ?Continue on Symbicort Twice daily ?Continue on Xolair every 3 months  ?Albuterol inhaler As needed   ?Saline nasal rinses As needed   ?Saline nasal gel At bedtime  As needed   ?Claritin 10mg  At bedtime  As needed  drainage  ?Follow up with Dr.  in 1 year and As needed   ?Please contact office for sooner follow up if symptoms do not improve or worsen or seek emergency care  ? ? ?  ? ?

## 2021-09-07 NOTE — Progress Notes (Signed)
Diagnosis: Asthma ° °Provider:  Praveen Mannam, MD ° °Procedure: Injection ° °Xolair (Omalizumab), Dose: 225 mg, Site: subcutaneous, Number of injections: 2 ° °Discharge: Condition: Good, Destination: Home . AVS provided to patient.  ° °Performed by:  Damarkus Balis A, RN  ° ° ° °  °

## 2021-09-07 NOTE — Patient Instructions (Addendum)
Mycelex troches five times daily for 1 week  ?Brush/rinse and gargle after inhaler use  ?Continue on Symbicort Twice daily ?Continue on Xolair every 3 months  ?Albuterol inhaler As needed   ?Saline nasal rinses As needed   ?Saline nasal gel At bedtime  As needed   ?Claritin 10mg  At bedtime  As needed  drainage  ?Follow up with Dr.  in 1 year and As needed   ?Please contact office for sooner follow up if symptoms do not improve or worsen or seek emergency care  ? ? ?

## 2021-09-07 NOTE — Assessment & Plan Note (Signed)
Continue current regimen.  May use saline nasal rinses or gel as needed.  Also may use Claritin as needed ?

## 2021-11-24 ENCOUNTER — Telehealth: Payer: Self-pay | Admitting: Adult Health

## 2021-11-24 NOTE — Telephone Encounter (Signed)
Patient checking on message left about medication. Patient phone number is [phone].

## 2021-11-25 MED ORDER — DOXYCYCLINE HYCLATE 100 MG PO TABS: 100.0000 mg | ORAL_TABLET | Freq: Two times a day (BID) | ORAL | 0 refills | Status: DC

## 2021-11-25 NOTE — Telephone Encounter (Signed)
Spoke with the pt and notified of response per Dr Maple Hudson. Rx was sent. Nothing further needed.

## 2021-11-25 NOTE — Telephone Encounter (Signed)
Suggest doxycycline 100 mg, # 14     1 twice daily Hope she feels better quickly

## 2021-11-25 NOTE — Telephone Encounter (Signed)
Spoke with the pt  She is c/o PND and cough with yellow sputum x 3 days  She states she started with a sore throat but this only lasted for a day She denies any wheezing, increased SOB, fevers, aches  She is asking for something to be called in  She declined appt that was offered to her  She is using her symbicort and duoneb Please advise thanks!  Allergies  Allergen Reactions   Clarithromycin     REACTION: itching   Penicillins     REACTION: hives

## 2021-11-30 ENCOUNTER — Other Ambulatory Visit: Payer: Self-pay | Admitting: Internal Medicine

## 2021-12-01 ENCOUNTER — Other Ambulatory Visit: Payer: Self-pay | Admitting: Internal Medicine

## 2021-12-05 ENCOUNTER — Encounter: Payer: Self-pay | Admitting: Internal Medicine

## 2021-12-05 ENCOUNTER — Ambulatory Visit: Payer: Medicare PPO | Admitting: Internal Medicine

## 2021-12-05 ENCOUNTER — Ambulatory Visit (INDEPENDENT_AMBULATORY_CARE_PROVIDER_SITE_OTHER): Payer: Medicare PPO

## 2021-12-05 VITALS — BP 142/60 | HR 97 | Temp 100.0°F | Ht 60.0 in | Wt 162.4 lb

## 2021-12-05 DIAGNOSIS — J45909 Unspecified asthma, uncomplicated: Secondary | ICD-10-CM | POA: Diagnosis not present

## 2021-12-05 DIAGNOSIS — J3089 Other allergic rhinitis: Secondary | ICD-10-CM

## 2021-12-05 DIAGNOSIS — J4541 Moderate persistent asthma with (acute) exacerbation: Secondary | ICD-10-CM | POA: Diagnosis not present

## 2021-12-05 DIAGNOSIS — J302 Other seasonal allergic rhinitis: Secondary | ICD-10-CM | POA: Diagnosis not present

## 2021-12-05 MED ORDER — CEFDINIR 300 MG PO CAPS: ORAL_CAPSULE | ORAL | 0 refills | Status: DC

## 2021-12-05 MED ORDER — PREDNISONE 10 MG PO TABS: ORAL_TABLET | ORAL | 0 refills | Status: DC

## 2021-12-05 NOTE — Patient Instructions (Signed)
Script sent for prednisone taper  Script sent for cefdinir antibiotic. There is a small chance you might have an allergic reaction to this, because it is a cousin of penicillin. I think it will be ok, especially since you will also be taking prednisone, but if you get a rash or other problems from it, stop it right away and let me know.   Order- CXR  dx asthma exacerbation

## 2021-12-05 NOTE — Progress Notes (Signed)
Patient ID: Deborah Branch, female    DOB: [birth year], 87 y.o.   MRN: [id number]  HPI  female never smoker followed for chronic asthma (Xolair), allergic rhinosinusitis, complicated by GERD Office Spirometry 08/26/15-moderate obstruction-FVC 1.7/76%, FEV1 1.1/67%, ratio 0.66, FEF 25-75% 0.6/41% --------------------------------------------------------------------------------------------   03/10/21-87 year old female never smoker followed for Chronic Asthma (Xolair), allergic rhinosinusitis, complicated by GERD, Osteoporosis,  Symbicort 80,  Proair hfa/ Ventolin, neb Duoneb,  Xolair Covid vax- 3 Phizer -----Doing good with breathing She feels well. No breakthrough with Xolair at 2 month intervals now. She doesn't want to stop cold. We discussed further lengthening to 3 months. Breztri worked fine, but [employer] works as well and cheaper. CXR 12/06/20- IMPRESSION: 1. No acute cardiopulmonary disease. 2. Aortic atherosclerosis.  12/05/21- 87 year old female never smoker followed for Chronic Asthma (Xolair), allergic rhinosinusitis, complicated by GERD, Osteoporosis,  -Symbicort 80,  Proair hfa/ Ventolin, neb Duoneb,  Xolair(q 3 months) Covid vax- 3 Phizer NP visit TP 09/07/21- increased allergic rhintis> Mycelex troches for thrush, Claritin -States increased sob, productive cough, yellow/green sputum.  Sick since we sent doxycycline on June 1. Temp today 100 deg. Bronchitis now x since first of month. Some chills. Sleeping in recliner. Deenies blood, pain, GI, dysuria.  Review of Systems- see HPI  +  = positive Constitutional:   No-   weight loss, night sweats, fevers, chills, fatigue, lassitude. HEENT:   No-  headaches, difficulty swallowing, tooth/dental problems, +sore throat,       No-  sneezing, itching, ear ache, +nasal congestion, + post nasal drip,  CV:  No-   chest pain, orthopnea, PND, +swelling in lower extremities, anasarca, dizziness, palpitations Resp: no-shortness of breath with  exertion or at rest.              +  productive cough,   +non-productive cough,  No-  coughing up of blood.             +change in color of mucus.     Skin: No-   rash or lesions. GI:  No-   heartburn, indigestion, abdominal pain, nausea, vomiting,  GU: MS:  No-   joint pain or swelling. Neuro- grossly normal  Psych:  No- change in mood or affect. No depression or anxiety.  No memory loss.     Objective:   Physical Exam General- Alert, Oriented, Affect-appropriate, Distress- none acute  Medium build Skin- rash-none, lesions- none, excoriation- none Lymphadenopathy- none Head- atraumatic            Eyes- Gross vision intact, PERRLA, conjunctivae clear secretions            Ears- Hearing, canals normal            Nose- No sniffing, no-Septal dev, mucus, polyps, erosion, perforation             Throat- Mallampati III-IV , mucosa clear- not red , drainage- none, tonsils- atrophic Neck- flexible , trachea midline, no stridor , thyroid nl, carotid no bruit Chest - symmetrical excursion , unlabored           Heart/CV- RRR , + murmur 1/6 syst , no gallop  , no rub, nl s1 s2                           - JVD- none , edema+trace, stasis changes- none, varices- none           Lung-  Wheeze- none , dullness-none, rub-  none, cough+,            Chest wall-  Abd-  Br/ Gen/ Rectal- Not done, not indicated Extrem- cyanosis- none, clubbing, none, atrophy- none, strength- nl Neuro- grossly intact to observation

## 2021-12-06 ENCOUNTER — Telehealth: Payer: Self-pay | Admitting: Internal Medicine

## 2021-12-07 ENCOUNTER — Inpatient Hospital Stay (HOSPITAL_COMMUNITY)
Admission: EM | Admit: 2021-12-07 | Discharge: 2021-12-09 | DRG: 193 | Disposition: A | Discharge status: Home / Self Care | Payer: Medicare PPO | Attending: Family Medicine | Admitting: Family Medicine

## 2021-12-07 ENCOUNTER — Other Ambulatory Visit: Payer: Self-pay

## 2021-12-07 ENCOUNTER — Emergency Department (HOSPITAL_COMMUNITY): Payer: Medicare PPO

## 2021-12-07 ENCOUNTER — Encounter (HOSPITAL_COMMUNITY): Payer: Self-pay | Admitting: *Deleted

## 2021-12-07 DIAGNOSIS — I5033 Acute on chronic diastolic (congestive) heart failure: Secondary | ICD-10-CM | POA: Diagnosis present

## 2021-12-07 DIAGNOSIS — J45901 Unspecified asthma with (acute) exacerbation: Secondary | ICD-10-CM | POA: Diagnosis present

## 2021-12-07 DIAGNOSIS — Z881 Allergy status to other antibiotic agents status: Secondary | ICD-10-CM | POA: Diagnosis not present

## 2021-12-07 DIAGNOSIS — Z87891 Personal history of nicotine dependence: Secondary | ICD-10-CM | POA: Diagnosis not present

## 2021-12-07 DIAGNOSIS — Z880 Allergy status to penicillin: Secondary | ICD-10-CM

## 2021-12-07 DIAGNOSIS — Z79899 Other long term (current) drug therapy: Secondary | ICD-10-CM

## 2021-12-07 DIAGNOSIS — Z9842 Cataract extraction status, left eye: Secondary | ICD-10-CM | POA: Diagnosis not present

## 2021-12-07 DIAGNOSIS — Z9841 Cataract extraction status, right eye: Secondary | ICD-10-CM

## 2021-12-07 DIAGNOSIS — J189 Pneumonia, unspecified organism: Secondary | ICD-10-CM | POA: Diagnosis present

## 2021-12-07 DIAGNOSIS — I110 Hypertensive heart disease with heart failure: Secondary | ICD-10-CM | POA: Diagnosis present

## 2021-12-07 DIAGNOSIS — J9601 Acute respiratory failure with hypoxia: Secondary | ICD-10-CM | POA: Diagnosis present

## 2021-12-07 DIAGNOSIS — Z7951 Long term (current) use of inhaled steroids: Secondary | ICD-10-CM | POA: Diagnosis not present

## 2021-12-07 DIAGNOSIS — R7989 Other specified abnormal findings of blood chemistry: Secondary | ICD-10-CM

## 2021-12-07 DIAGNOSIS — M810 Age-related osteoporosis without current pathological fracture: Secondary | ICD-10-CM | POA: Diagnosis present

## 2021-12-07 DIAGNOSIS — K219 Gastro-esophageal reflux disease without esophagitis: Secondary | ICD-10-CM | POA: Diagnosis present

## 2021-12-07 DIAGNOSIS — Z7982 Long term (current) use of aspirin: Secondary | ICD-10-CM | POA: Diagnosis not present

## 2021-12-07 DIAGNOSIS — R059 Cough, unspecified: Secondary | ICD-10-CM | POA: Diagnosis not present

## 2021-12-07 DIAGNOSIS — E782 Mixed hyperlipidemia: Secondary | ICD-10-CM | POA: Diagnosis present

## 2021-12-07 DIAGNOSIS — I509 Heart failure, unspecified: Secondary | ICD-10-CM | POA: Diagnosis not present

## 2021-12-07 DIAGNOSIS — R0602 Shortness of breath: Secondary | ICD-10-CM | POA: Diagnosis not present

## 2021-12-07 DIAGNOSIS — I700 Atherosclerosis of aorta: Secondary | ICD-10-CM | POA: Diagnosis not present

## 2021-12-07 DIAGNOSIS — Z20822 Contact with and (suspected) exposure to covid-19: Secondary | ICD-10-CM | POA: Diagnosis present

## 2021-12-07 DIAGNOSIS — I10 Essential (primary) hypertension: Secondary | ICD-10-CM | POA: Diagnosis not present

## 2021-12-07 DIAGNOSIS — D72829 Elevated white blood cell count, unspecified: Secondary | ICD-10-CM

## 2021-12-07 LAB — CBC WITH DIFFERENTIAL/PLATELET
Abs Immature Granulocytes: 0.12 10*3/uL — ABNORMAL HIGH (ref 0.00–0.07)
Basophils Absolute: 0 10*3/uL (ref 0.0–0.1)
Basophils Relative: 0 %
Eosinophils Absolute: 0 10*3/uL (ref 0.0–0.5)
Eosinophils Relative: 0 %
HCT: 40.3 % (ref 36.0–46.0)
Hemoglobin: 13 g/dL (ref 12.0–15.0)
Immature Granulocytes: 1 %
Lymphocytes Relative: 10 %
Lymphs Abs: 1.6 10*3/uL (ref 0.7–4.0)
MCH: 29.4 pg (ref 26.0–34.0)
MCHC: 32.3 g/dL (ref 30.0–36.0)
MCV: 91.2 fL (ref 80.0–100.0)
Monocytes Absolute: 0.9 10*3/uL (ref 0.1–1.0)
Monocytes Relative: 6 %
Neutro Abs: 13.2 10*3/uL — ABNORMAL HIGH (ref 1.7–7.7)
Neutrophils Relative %: 83 %
Platelets: 278 10*3/uL (ref 150–400)
RBC: 4.42 MIL/uL (ref 3.87–5.11)
RDW: 14.2 % (ref 11.5–15.5)
WBC: 15.9 10*3/uL — ABNORMAL HIGH (ref 4.0–10.5)
nRBC: 0 % (ref 0.0–0.2)

## 2021-12-07 LAB — COMPREHENSIVE METABOLIC PANEL
ALT: 20 U/L (ref 0–44)
AST: 28 U/L (ref 15–41)
Albumin: 4 g/dL (ref 3.5–5.0)
Alkaline Phosphatase: 48 U/L (ref 38–126)
Anion gap: 9 (ref 5–15)
BUN: 22 mg/dL (ref 8–23)
CO2: 23 mmol/L (ref 22–32)
Calcium: 8.9 mg/dL (ref 8.9–10.3)
Chloride: 106 mmol/L (ref 98–111)
Creatinine, Ser: 1.05 mg/dL — ABNORMAL HIGH (ref 0.44–1.00)
GFR, Estimated: 52 mL/min — ABNORMAL LOW (ref >60)
Glucose, Bld: 113 mg/dL — ABNORMAL HIGH (ref 70–99)
Potassium: 3.7 mmol/L (ref 3.5–5.1)
Sodium: 138 mmol/L (ref 135–145)
Total Bilirubin: 0.4 mg/dL (ref 0.3–1.2)
Total Protein: 7.4 g/dL (ref 6.5–8.1)

## 2021-12-07 LAB — RESP PANEL BY RT-PCR (FLU A&B, COVID) ARPGX2
Influenza A by PCR: NEGATIVE
Influenza B by PCR: NEGATIVE
SARS Coronavirus 2 by RT PCR: NEGATIVE

## 2021-12-07 LAB — BRAIN NATRIURETIC PEPTIDE: B Natriuretic Peptide: 196 pg/mL — ABNORMAL HIGH (ref 0.0–100.0)

## 2021-12-07 LAB — LACTIC ACID, PLASMA
Lactic Acid, Venous: 1.4 mmol/L (ref 0.5–1.9)
Lactic Acid, Venous: 1.2 mmol/L (ref 0.5–1.9)

## 2021-12-07 MED ORDER — SODIUM CHLORIDE 0.9 % IV SOLN
100.0000 mg | Freq: Once | INTRAVENOUS | Status: AC
  Administered 2021-12-07: 100 mg via INTRAVENOUS
  Filled 2021-12-07 (×2): qty 100

## 2021-12-07 MED ORDER — SODIUM CHLORIDE 0.9 % IV SOLN
1.0000 g | Freq: Once | INTRAVENOUS | Status: AC
  Administered 2021-12-07: 1 g via INTRAVENOUS
  Filled 2021-12-07: qty 10

## 2021-12-07 MED ORDER — SODIUM CHLORIDE 0.9 % IV BOLUS
500.0000 mL | Freq: Once | INTRAVENOUS | Status: AC
Start: 2021-12-07 — End: 2021-12-07
  Administered 2021-12-07: 500 mL via INTRAVENOUS

## 2021-12-07 MED ORDER — ALBUTEROL SULFATE (2.5 MG/3ML) 0.083% IN NEBU
2.5000 mg | INHALATION_SOLUTION | Freq: Once | RESPIRATORY_TRACT | Status: AC
  Administered 2021-12-07: 2.5 mg via RESPIRATORY_TRACT
  Filled 2021-12-07: qty 3

## 2021-12-07 MED ORDER — IPRATROPIUM-ALBUTEROL 0.5-2.5 (3) MG/3ML IN SOLN
3.0000 mL | Freq: Once | RESPIRATORY_TRACT | Status: AC
  Administered 2021-12-07: 3 mL via RESPIRATORY_TRACT
  Filled 2021-12-07: qty 3

## 2021-12-07 NOTE — ED Provider Notes (Signed)
[facility] EMERGENCY DEPARTMENT Provider Note   CSN: [id number] Arrival date & time: 12/07/21  1831     History  Chief Complaint  Patient presents with   Shortness of Breath    Cough  X 3 weeks    Deborah Branch is a 87 y.o. female.  HPI 87 year old female with a history of asthma presents with shortness of breath and cough.  She has been dealing with respiratory symptoms for about 3 weeks.  However everything is gotten worse over the last 2 days.  Saw her doctor, Dr. Maple HudsonYoung and was put on cefdinir and prednisone.  She has been taking this since getting it on 6/12.  She had a low-grade fever of 100 at the office.  No chest pain.  Maybe a little bit of l feet swelling.  Has been using albuterol treatments with some partial relief but symptoms just seem to be progressively worsening.  Home Medications Prior to Admission medications   Medication Sig Start Date End Date Taking? Authorizing Provider  albuterol (VENTOLIN HFA) 108 (90 Base) MCG/ACT inhaler INHALE TWO PUFFS BY MOUTH EVERY 4 HOURS AS NEEDED FOR WHEEZING AND SHORTNESS OF BREATH 11/30/21  Yes Young, Joni Fearslinton D, MD  aspirin 81 MG tablet Take 81 mg by mouth daily.     Yes [provider]  budesonide-formoterol (SYMBICORT) 80-4.5 MCG/ACT inhaler INHALE TWO PUFFS INTO THE LUNGS TWICE DAILY 04/28/21  Yes Young, Joni Fearslinton D, MD  calcium carbonate (OSCAL) 1500 (600 Ca) MG TABS tablet Take 1 tablet by mouth 2 (two) times daily with a meal.   Yes [provider]  cefdinir (OMNICEF) 300 MG capsule 1 twice daily Patient taking differently: Take 300 mg by mouth 2 (two) times daily. 12/05/21  Yes Young, Joni Fearslinton D, MD  denosumab (PROLIA) 60 MG/ML SOSY injection Inject 60 mg into the skin every 6 (six) months.   Yes [provider]  ipratropium-albuterol (DUONEB) 0.5-2.5 (3) MG/3ML SOLN INHALE CONTENTS OF 1 VIAL IN NEBULIZER EVERY 6 HOURS AS NEEDED Patient taking differently: Take 3 mLs by nebulization every 6 (six) hours as  needed (shortness of breath and cough). 12/02/21  Yes Young, Joni Fearslinton D, MD  losartan-hydrochlorothiazide (HYZAAR) 100-12.5 MG tablet Take 1 tablet by mouth daily. 11/07/21  Yes [provider]  Menaquinone-7 (VITAMIN K2 PO) Take 100 mcg by mouth daily.   Yes [provider]  Omalizumab (XOLAIR [place]) Inject 225 mg into the skin every 3 (three) months.   Yes [provider]  omeprazole (PRILOSEC) 20 MG capsule Take 20 mg by mouth daily.     Yes [provider]  predniSONE (DELTASONE) 10 MG tablet 4 X 2 DAYS, 3 X 2 DAYS, 2 X 2 DAYS, 1 X 2 DAYS 12/05/21  Yes Young, Clinton D, MD  simvastatin (ZOCOR) 20 MG tablet Take 20 mg by mouth at bedtime.     Yes [provider]  zinc gluconate 50 MG tablet Take 50 mg by mouth daily.   Yes [provider]  clotrimazole (MYCELEX) 10 MG troche Take 1 tablet (10 mg total) by mouth 5 (five) times daily. Patient not taking: Reported on 12/07/2021 09/07/21   Parrett, Virgel Bouquetammy S, NP  escitalopram (LEXAPRO) 5 MG tablet Take 5 mg by mouth daily. 11/07/21   [provider]      Allergies    Clarithromycin and Penicillins    Review of Systems   Review of Systems  Constitutional:  Positive for fever.  Respiratory:  Positive for cough (  with yellow sputum) and shortness of breath.   Cardiovascular:  Positive for leg swelling. Negative for chest pain.    Physical Exam Updated Vital Signs BP (!) 159/63   Pulse 82   Temp 98 F (36.7 C) (Oral)   Resp (!) 22   Ht 5' (1.524 m)   Wt 72.6 kg   SpO2 91%   BMI 31.25 kg/m  Physical Exam Vitals and nursing note reviewed.  Constitutional:      Appearance: She is well-developed.  HENT:     Head: Normocephalic and atraumatic.  Cardiovascular:     Rate and Rhythm: Normal rate and regular rhythm.     Heart sounds: Normal heart sounds.  Pulmonary:     Effort: Pulmonary effort is normal. Tachypnea present. No respiratory distress.     Breath sounds: Wheezing present.   Abdominal:     Palpations: Abdomen is soft.     Tenderness: There is no abdominal tenderness.  Skin:    General: Skin is warm and dry.  Neurological:     Mental Status: She is alert.     ED Results / Procedures / Treatments   Labs (all labs ordered are listed, but only abnormal results are displayed) Labs Reviewed  COMPREHENSIVE METABOLIC PANEL - Abnormal; Notable for the following components:      Result Value   Glucose, Bld 113 (*)    Creatinine, Ser 1.05 (*)    GFR, Estimated 52 (*)    All other components within normal limits  CBC WITH DIFFERENTIAL/PLATELET - Abnormal; Notable for the following components:   WBC 15.9 (*)    Neutro Abs 13.2 (*)    Abs Immature Granulocytes 0.12 (*)    All other components within normal limits  BRAIN NATRIURETIC PEPTIDE - Abnormal; Notable for the following components:   B Natriuretic Peptide 196.0 (*)    All other components within normal limits  RESP PANEL BY RT-PCR (FLU A&B, COVID) ARPGX2  CULTURE, BLOOD (ROUTINE X 2)  CULTURE, BLOOD (ROUTINE X 2)  RESPIRATORY PANEL BY PCR  LACTIC ACID, PLASMA  LACTIC ACID, PLASMA    EKG EKG Interpretation  Date/Time:  Wednesday December 07 2021 20:40:23 EDT Ventricular Rate:  76 PR Interval:  61 QRS Duration: 87 QT Interval:  369 QTC Calculation: 418 R Axis:   64 Text Interpretation: Sinus rhythm Short PR interval Probable left atrial enlargement Poor data quality in current ECG precludes serial comparison Confirmed by Pricilla Loveless [phone]) on 12/07/2021 9:45:17 PM  Radiology DG Chest 2 View  Result Date: 12/07/2021 CLINICAL DATA:  Shortness of breath and cough x3 weeks. EXAM: CHEST - 2 VIEW COMPARISON:  December 05, 2021 FINDINGS: The heart size and mediastinal contours are within normal limits. There is mild calcification of the aortic arch. Moderate severity diffuse, chronic appearing increased interstitial lung markings are seen. Mild atelectasis and/or infiltrate is noted within the left lung  base. There is no evidence of a pleural effusion or pneumothorax. Multilevel degenerative changes are noted throughout the thoracic spine. IMPRESSION: 1. Chronic appearing increased interstitial lung markings. 2. Mild left basilar atelectasis and/or infiltrate. Electronically Signed   By: Aram Candela M.D.   On: 12/07/2021 19:36    Procedures Procedures    Medications Ordered in ED Medications  doxycycline (VIBRAMYCIN) 100 mg in sodium chloride 0.9 % 250 mL IVPB (100 mg Intravenous New Bag/Given 12/07/21 2240)  ipratropium-albuterol (DUONEB) 0.5-2.5 (3) MG/3ML nebulizer solution 3 mL (3 mLs Nebulization Given 12/07/21 2031)  albuterol (PROVENTIL) (2.5  MG/3ML) 0.083% nebulizer solution 2.5 mg (2.5 mg Nebulization Given 12/07/21 2031)  sodium chloride 0.9 % bolus 500 mL (0 mLs Intravenous Stopped 12/07/21 2153)  cefTRIAXone (ROCEPHIN) 1 g in sodium chloride 0.9 % 100 mL IVPB (0 g Intravenous Stopped 12/07/21 2239)    ED Course/ Medical Decision Making/ A&P                           Medical Decision Making Amount and/or Complexity of Data Reviewed External Data Reviewed: notes. Labs: ordered. Radiology: ordered and independent interpretation performed. ECG/medicine tests: ordered and independent interpretation performed.  Risk Prescription drug management. Decision regarding hospitalization.   Patient presents with pneumonia.  I personally viewed/interpreted her chest x-ray which does seem to show a left lower lobe infiltrate.  Based on presentation I suspect this is pneumonia rather than atelectasis.  White count is up and while this may be related to her 2 days of prednisone, I am concerned about her worsening symptoms and thus she will be given IV Rocephin and doxycycline given clarithromycin allergy.  Lungs seem to have cleared up somewhat with the DuoNeb but there is still some crackles at the bases now that the wheezing is gone.  Otherwise lactate is okay.  ECG without acute  ischemia.  She was ambulated and O2 sats dropped into the low 80s.  I think she is not fit enough to go home and thus will be admitted.  Discussed with Dr. Thomes Dinning.        Final Clinical Impression(s) / ED Diagnoses Final diagnoses:  Community acquired pneumonia of left lower lobe of lung    Rx / DC Orders ED Discharge Orders     None         Pricilla Loveless, MD 12/07/21 2313

## 2021-12-07 NOTE — ED Triage Notes (Signed)
Pt with SOB and cough x 3 weeks, started on antibiotics few days ago-seen Dr. Annamaria Boots (pulmonary MD).  Pt with congestion noted in triage. Productive cough, yellow phlegm, thick per pt. Fever on Monday

## 2021-12-07 NOTE — ED Notes (Signed)
Ambulated pt around nurses station at this time pt did drop her saturation down to 85 while walking pt also described a tight feeing in her chest as well. [facility] R.N. aware as well as DR. Goldston . Misty Stanley emt/nt

## 2021-12-07 NOTE — Telephone Encounter (Signed)
Called and spoke with patient who states that yesterday she felt better and was not able to sleep at all last night and does not feel good this morning. Thinks that it was due to doing a breathing treatment to late. Patient still taking prednisone and Cefdinir. Advised her to finish the medications until they are completely gone even if she starts to feel better. She expressed understanding. Advised her to call us back if she starts to feel worse or if she is not feeling better after completing medications. Nothing further needed at this time.

## 2021-12-08 ENCOUNTER — Inpatient Hospital Stay (HOSPITAL_COMMUNITY): Payer: Medicare PPO

## 2021-12-08 ENCOUNTER — Ambulatory Visit: Payer: Medicare PPO

## 2021-12-08 ENCOUNTER — Encounter (HOSPITAL_COMMUNITY): Payer: Self-pay | Admitting: Internal Medicine

## 2021-12-08 DIAGNOSIS — Z880 Allergy status to penicillin: Secondary | ICD-10-CM | POA: Diagnosis not present

## 2021-12-08 DIAGNOSIS — I509 Heart failure, unspecified: Secondary | ICD-10-CM

## 2021-12-08 DIAGNOSIS — J9601 Acute respiratory failure with hypoxia: Secondary | ICD-10-CM | POA: Diagnosis present

## 2021-12-08 DIAGNOSIS — Z9842 Cataract extraction status, left eye: Secondary | ICD-10-CM | POA: Diagnosis not present

## 2021-12-08 DIAGNOSIS — M810 Age-related osteoporosis without current pathological fracture: Secondary | ICD-10-CM | POA: Diagnosis present

## 2021-12-08 DIAGNOSIS — Z20822 Contact with and (suspected) exposure to covid-19: Secondary | ICD-10-CM | POA: Diagnosis present

## 2021-12-08 DIAGNOSIS — R0602 Shortness of breath: Secondary | ICD-10-CM

## 2021-12-08 DIAGNOSIS — J45901 Unspecified asthma with (acute) exacerbation: Secondary | ICD-10-CM | POA: Diagnosis present

## 2021-12-08 DIAGNOSIS — I5033 Acute on chronic diastolic (congestive) heart failure: Secondary | ICD-10-CM | POA: Diagnosis present

## 2021-12-08 DIAGNOSIS — J189 Pneumonia, unspecified organism: Secondary | ICD-10-CM | POA: Diagnosis present

## 2021-12-08 DIAGNOSIS — D72829 Elevated white blood cell count, unspecified: Secondary | ICD-10-CM

## 2021-12-08 DIAGNOSIS — R7989 Other specified abnormal findings of blood chemistry: Secondary | ICD-10-CM

## 2021-12-08 DIAGNOSIS — Z79899 Other long term (current) drug therapy: Secondary | ICD-10-CM | POA: Diagnosis not present

## 2021-12-08 DIAGNOSIS — Z881 Allergy status to other antibiotic agents status: Secondary | ICD-10-CM | POA: Diagnosis not present

## 2021-12-08 DIAGNOSIS — Z7951 Long term (current) use of inhaled steroids: Secondary | ICD-10-CM | POA: Diagnosis not present

## 2021-12-08 DIAGNOSIS — E782 Mixed hyperlipidemia: Secondary | ICD-10-CM | POA: Diagnosis present

## 2021-12-08 DIAGNOSIS — I110 Hypertensive heart disease with heart failure: Secondary | ICD-10-CM | POA: Diagnosis present

## 2021-12-08 DIAGNOSIS — Z9841 Cataract extraction status, right eye: Secondary | ICD-10-CM | POA: Diagnosis not present

## 2021-12-08 DIAGNOSIS — Z87891 Personal history of nicotine dependence: Secondary | ICD-10-CM | POA: Diagnosis not present

## 2021-12-08 DIAGNOSIS — K219 Gastro-esophageal reflux disease without esophagitis: Secondary | ICD-10-CM | POA: Diagnosis present

## 2021-12-08 DIAGNOSIS — I10 Essential (primary) hypertension: Secondary | ICD-10-CM

## 2021-12-08 DIAGNOSIS — Z7982 Long term (current) use of aspirin: Secondary | ICD-10-CM | POA: Diagnosis not present

## 2021-12-08 LAB — COMPREHENSIVE METABOLIC PANEL
ALT: 16 U/L (ref 0–44)
AST: 21 U/L (ref 15–41)
Albumin: 3.3 g/dL — ABNORMAL LOW (ref 3.5–5.0)
Alkaline Phosphatase: 42 U/L (ref 38–126)
Anion gap: 8 (ref 5–15)
BUN: 20 mg/dL (ref 8–23)
CO2: 23 mmol/L (ref 22–32)
Calcium: 8.2 mg/dL — ABNORMAL LOW (ref 8.9–10.3)
Chloride: 109 mmol/L (ref 98–111)
Creatinine, Ser: 0.95 mg/dL (ref 0.44–1.00)
GFR, Estimated: 59 mL/min — ABNORMAL LOW (ref >60)
Glucose, Bld: 130 mg/dL — ABNORMAL HIGH (ref 70–99)
Potassium: 3.6 mmol/L (ref 3.5–5.1)
Sodium: 140 mmol/L (ref 135–145)
Total Bilirubin: 0.5 mg/dL (ref 0.3–1.2)
Total Protein: 6.2 g/dL — ABNORMAL LOW (ref 6.5–8.1)

## 2021-12-08 LAB — RESPIRATORY PANEL BY PCR

## 2021-12-08 LAB — ECHOCARDIOGRAM COMPLETE
AR max vel: 2 cm2
AV Area VTI: 2.06 cm2
AV Area mean vel: 2.1 cm2
AV Mean grad: 8 mmHg
AV Peak grad: 15.1 mmHg
Ao pk vel: 1.94 m/s
Area-P 1/2: 3.77 cm2
Calc EF: 70 %
Height: 60 in
MV VTI: 2.55 cm2
S' Lateral: 2.6 cm
Single Plane A2C EF: 69.1 %
Single Plane A4C EF: 72.1 %
Weight: 2599.66 oz

## 2021-12-08 LAB — CBC
HCT: 36.2 % (ref 36.0–46.0)
Hemoglobin: 11.8 g/dL — ABNORMAL LOW (ref 12.0–15.0)
MCH: 29.4 pg (ref 26.0–34.0)
MCHC: 32.6 g/dL (ref 30.0–36.0)
MCV: 90.3 fL (ref 80.0–100.0)
Platelets: 244 10*3/uL (ref 150–400)
RBC: 4.01 MIL/uL (ref 3.87–5.11)
RDW: 14.2 % (ref 11.5–15.5)
WBC: 16.9 10*3/uL — ABNORMAL HIGH (ref 4.0–10.5)
nRBC: 0 % (ref 0.0–0.2)

## 2021-12-08 LAB — EXPECTORATED SPUTUM ASSESSMENT W GRAM STAIN, RFLX TO RESP C

## 2021-12-08 LAB — STREP PNEUMONIAE URINARY ANTIGEN: Strep Pneumo Urinary Antigen: NEGATIVE

## 2021-12-08 LAB — MAGNESIUM: Magnesium: 1.8 mg/dL (ref 1.7–2.4)

## 2021-12-08 LAB — APTT: aPTT: 25 seconds (ref 24–36)

## 2021-12-08 LAB — MRSA NEXT GEN BY PCR, NASAL: MRSA by PCR Next Gen: NOT DETECTED

## 2021-12-08 LAB — PROCALCITONIN: Procalcitonin: <0.1 ng/mL

## 2021-12-08 LAB — PHOSPHORUS: Phosphorus: 2.7 mg/dL (ref 2.5–4.6)

## 2021-12-08 MED ORDER — ENOXAPARIN SODIUM 40 MG/0.4ML IJ SOSY
40.0000 mg | PREFILLED_SYRINGE | INTRAMUSCULAR | Status: DC
  Administered 2021-12-08 – 2021-12-09 (×2): 40 mg via SUBCUTANEOUS
  Filled 2021-12-08 (×2): qty 0.4

## 2021-12-08 MED ORDER — ONDANSETRON HCL 4 MG/2ML IJ SOLN: 4.0000 mg | Freq: Four times a day (QID) | INTRAMUSCULAR | Status: DC | PRN

## 2021-12-08 MED ORDER — LOSARTAN POTASSIUM-HCTZ 100-12.5 MG PO TABS: 1.0000 | ORAL_TABLET | Freq: Every day | ORAL | Status: DC

## 2021-12-08 MED ORDER — GUAIFENESIN-DM 100-10 MG/5ML PO SYRP: 5.0000 mL | ORAL_SOLUTION | ORAL | Status: DC | PRN

## 2021-12-08 MED ORDER — IPRATROPIUM-ALBUTEROL 0.5-2.5 (3) MG/3ML IN SOLN
3.0000 mL | RESPIRATORY_TRACT | Status: DC | PRN
  Administered 2021-12-08: 3 mL via RESPIRATORY_TRACT
  Filled 2021-12-08: qty 3

## 2021-12-08 MED ORDER — PANTOPRAZOLE SODIUM 40 MG PO TBEC
40.0000 mg | DELAYED_RELEASE_TABLET | Freq: Every day | ORAL | Status: DC
  Administered 2021-12-08 – 2021-12-09 (×2): 40 mg via ORAL
  Filled 2021-12-08 (×2): qty 1

## 2021-12-08 MED ORDER — SODIUM CHLORIDE 0.9 % IV SOLN
100.0000 mg | Freq: Two times a day (BID) | INTRAVENOUS | Status: DC
  Administered 2021-12-08 (×2): 100 mg via INTRAVENOUS
  Filled 2021-12-08 (×5): qty 100

## 2021-12-08 MED ORDER — METHYLPREDNISOLONE SODIUM SUCC 40 MG IJ SOLR
40.0000 mg | Freq: Two times a day (BID) | INTRAMUSCULAR | Status: DC
  Administered 2021-12-08 – 2021-12-09 (×3): 40 mg via INTRAVENOUS
  Filled 2021-12-08 (×3): qty 1

## 2021-12-08 MED ORDER — ACETAMINOPHEN 650 MG RE SUPP: 650.0000 mg | Freq: Four times a day (QID) | RECTAL | Status: DC | PRN

## 2021-12-08 MED ORDER — CHLORHEXIDINE GLUCONATE CLOTH 2 % EX PADS
6.0000 | MEDICATED_PAD | Freq: Every day | CUTANEOUS | Status: DC
  Administered 2021-12-08 – 2021-12-09 (×2): 6 via TOPICAL

## 2021-12-08 MED ORDER — SIMVASTATIN 20 MG PO TABS
20.0000 mg | ORAL_TABLET | Freq: Every day | ORAL | Status: DC
  Administered 2021-12-08: 20 mg via ORAL
  Filled 2021-12-08: qty 1

## 2021-12-08 MED ORDER — ACETAMINOPHEN 325 MG PO TABS: 650.0000 mg | ORAL_TABLET | Freq: Four times a day (QID) | ORAL | Status: DC | PRN

## 2021-12-08 MED ORDER — SODIUM CHLORIDE 0.9 % IV SOLN
1.0000 g | INTRAVENOUS | Status: DC
  Administered 2021-12-08: 1 g via INTRAVENOUS
  Filled 2021-12-08: qty 10

## 2021-12-08 MED ORDER — DM-GUAIFENESIN ER 30-600 MG PO TB12
1.0000 | ORAL_TABLET | Freq: Two times a day (BID) | ORAL | Status: DC
  Administered 2021-12-08 – 2021-12-09 (×4): 1 via ORAL
  Filled 2021-12-08 (×4): qty 1

## 2021-12-08 MED ORDER — LOSARTAN POTASSIUM 50 MG PO TABS
100.0000 mg | ORAL_TABLET | Freq: Every day | ORAL | Status: DC
  Administered 2021-12-08 – 2021-12-09 (×2): 100 mg via ORAL
  Filled 2021-12-08 (×2): qty 2

## 2021-12-08 MED ORDER — ONDANSETRON HCL 4 MG PO TABS: 4.0000 mg | ORAL_TABLET | Freq: Four times a day (QID) | ORAL | Status: DC | PRN

## 2021-12-08 MED ORDER — MOMETASONE FURO-FORMOTEROL FUM 100-5 MCG/ACT IN AERO
2.0000 | INHALATION_SPRAY | Freq: Two times a day (BID) | RESPIRATORY_TRACT | Status: DC
  Administered 2021-12-08 – 2021-12-09 (×3): 2 via RESPIRATORY_TRACT
  Filled 2021-12-08: qty 8.8

## 2021-12-08 MED ORDER — HYDROCHLOROTHIAZIDE 12.5 MG PO TABS
12.5000 mg | ORAL_TABLET | Freq: Every day | ORAL | Status: DC
  Administered 2021-12-08: 12.5 mg via ORAL
  Filled 2021-12-08: qty 1

## 2021-12-08 MED ORDER — FUROSEMIDE 10 MG/ML IJ SOLN
20.0000 mg | Freq: Two times a day (BID) | INTRAMUSCULAR | Status: DC
  Administered 2021-12-08 – 2021-12-09 (×2): 20 mg via INTRAVENOUS
  Filled 2021-12-08 (×2): qty 2

## 2021-12-08 NOTE — Progress Notes (Addendum)
When I came into room, patient was SOB, wheezing.  Sat was 88%.  Placed patient on 2L [place] after treatment to help alleviate SOB.  Gave patient IS and flutter and explained usage and how often to use.  Patient was able to do 750 ml X10 on IS.  Both are at bedside and asked patient to make sure they are taken up with her when she gets a room.

## 2021-12-08 NOTE — TOC Progression Note (Signed)
  Transition of Care [facility]) Screening Note   Patient Details  Name: Deborah Branch Date of Birth: March 03, 1935   Transition of Care [facility]) CM/SW Contact:    Leitha Bleak, RN Phone Number: 12/08/2021, 11:27 AM    Transition of Care Department [facility]) has reviewed patient and no TOC needs have been identified at this time. We will continue to monitor patient advancement through interdisciplinary progression rounds. If new patient transition needs arise, please place a TOC consult.      Barriers to Discharge: Continued Medical Work up

## 2021-12-08 NOTE — H&P (Signed)
History and Physical    Patient: Deborah Branch:[id number] DOB: 07/16/1934 DOA: 12/07/2021 DOS: the patient was seen and examined on 12/08/2021 PCP: Pcp, No  Patient coming from: Home  Chief Complaint:  Chief Complaint  Patient presents with   Shortness of Breath    Cough  X 3 weeks   HPI: Deborah Branch is a 88 y.o. female with medical history significant of chronic asthma, allergic rhinosinusitis, GERD, hypertension, hyperlipidemia who presents to the emergency department due to 3-week onset of cough with production of yellow phlegm.  Symptoms worsened within the last 2 days with increased shortness of breath, she went to her pulmonologist (Dr. Maple Hudson) who prescribed cefdinir and prednisone which she started taking on 6/12.  Patient has been compliant with the medication and has been using albuterol treatments with only minimal improvement in symptoms.  Granddaughter at bedside, states that patient was exposed to her son Ivor Costa) who had respiratory illness that required being admitted to [facility], she states that her mother (patient's daughter) have similar symptoms, though was minimal compared to patient's presentation.  She denies headache, blurry vision, vomiting, abdominal pain, constipation  Deborah Course:  In the emergency department, she was tachypneic, BP was elevated at 172/71.  Patient's O2 sat dropped to 85% on ambulation and complained of chest tightness, she was provided with supplemental oxygen at 2 LPM with improved oxygenation.  Work-up in the Deborah showed normal CBC except for leukocytosis, BMP was normal except for CBG 113, creatinine 1.05, lactic acid 1.4 > 1.2, BNP 196.  Influenza A, B, SARS coronavirus 2 was negative. Chest x-ray showed 1. Chronic appearing increased interstitial lung markings. 2. Mild left basilar atelectasis and/or infiltrate. Breathing treatment was provided, patient was started on IV ceftriaxone and doxycycline, IV hydration was provided.  Hospitalist  was asked to admit patient for further evaluation and management.  Review of Systems: Review of systems as noted in the HPI. All other systems reviewed and are negative.   Past Medical History:  Diagnosis Date   Allergic asthma    GERD (gastroesophageal reflux disease)    Hyperlipidemia    Hypertension    Osteoporosis    Past Surgical History:  Procedure Laterality Date   CATARACT EXTRACTION, BILATERAL  2012    Social History:  reports that she has quit smoking. Her smoking use included cigarettes. She has never used smokeless tobacco. She reports current alcohol use. She reports that she does not use drugs.   Allergies  Allergen Reactions   Clarithromycin     REACTION: itching   Penicillins     REACTION: hives    Family History  Problem Relation Age of Onset   Colon cancer Neg Hx    Esophageal cancer Neg Hx    Rectal cancer Neg Hx    Stomach cancer Neg Hx      Prior to Admission medications   Medication Sig Start Date End Date Taking? Authorizing Provider  albuterol (VENTOLIN HFA) 108 (90 Base) MCG/ACT inhaler INHALE TWO PUFFS BY MOUTH EVERY 4 HOURS AS NEEDED FOR WHEEZING AND SHORTNESS OF BREATH 11/30/21  Yes Young, Joni Fears D, MD  aspirin 81 MG tablet Take 81 mg by mouth daily.     Yes [provider]  budesonide-formoterol (SYMBICORT) 80-4.5 MCG/ACT inhaler INHALE TWO PUFFS INTO THE LUNGS TWICE DAILY 04/28/21  Yes Young, Joni Fears D, MD  calcium carbonate (OSCAL) 1500 (600 Ca) MG TABS tablet Take 1 tablet by mouth 2 (two) times daily with a meal.  Yes [provider]  cefdinir (OMNICEF) 300 MG capsule 1 twice daily Patient taking differently: Take 300 mg by mouth 2 (two) times daily. 12/05/21  Yes Young, Joni Fears D, MD  denosumab (PROLIA) 60 MG/ML SOSY injection Inject 60 mg into the skin every 6 (six) months.   Yes [provider]  ipratropium-albuterol (DUONEB) 0.5-2.5 (3) MG/3ML SOLN INHALE CONTENTS OF 1 VIAL IN NEBULIZER EVERY 6 HOURS AS  NEEDED Patient taking differently: Take 3 mLs by nebulization every 6 (six) hours as needed (shortness of breath and cough). 12/02/21  Yes Young, Joni Fears D, MD  losartan-hydrochlorothiazide (HYZAAR) 100-12.5 MG tablet Take 1 tablet by mouth daily. 11/07/21  Yes [provider]  Menaquinone-7 (VITAMIN K2 PO) Take 100 mcg by mouth daily.   Yes [provider]  Omalizumab (XOLAIR [place]) Inject 225 mg into the skin every 3 (three) months.   Yes [provider]  omeprazole (PRILOSEC) 20 MG capsule Take 20 mg by mouth daily.     Yes [provider]  predniSONE (DELTASONE) 10 MG tablet 4 X 2 DAYS, 3 X 2 DAYS, 2 X 2 DAYS, 1 X 2 DAYS 12/05/21  Yes Young, Clinton D, MD  simvastatin (ZOCOR) 20 MG tablet Take 20 mg by mouth at bedtime.     Yes [provider]  zinc gluconate 50 MG tablet Take 50 mg by mouth daily.   Yes [provider]  clotrimazole (MYCELEX) 10 MG troche Take 1 tablet (10 mg total) by mouth 5 (five) times daily. Patient not taking: Reported on 12/07/2021 09/07/21   Parrett, Virgel Bouquet, NP  escitalopram (LEXAPRO) 5 MG tablet Take 5 mg by mouth daily. 11/07/21   [provider]    Physical Exam: BP (!) 157/71   Pulse 86   Temp 98 F (36.7 C) (Oral)   Resp (!) 22   Ht 5' (1.524 m)   Wt 72.6 kg   SpO2 91%   BMI 31.25 kg/m   General: 87 y.o. year-old female ill appearing, but in no acute distress.  Alert and oriented x3. HEENT: NCAT, EOMI Neck: Supple, trachea medial Cardiovascular: Regular rate and rhythm with no rubs or gallops.  No thyromegaly or JVD noted.  No lower extremity edema. 2/4 pulses in all 4 extremities. Respiratory: Tachypnea, diffuse wheezes on auscultation. Abdomen: Soft, nontender nondistended with normal bowel sounds x4 quadrants. Muskuloskeletal: No cyanosis, clubbing or edema noted bilaterally Neuro: CN II-XII intact, strength 5/5 x 4, sensation, reflexes intact Skin: No ulcerative lesions noted or  rashes Psychiatry: Judgement and insight appear normal. Mood is appropriate for condition and setting          Labs on Admission:  Basic Metabolic Panel: Recent Labs  Lab 12/07/21 2023  NA 138  K 3.7  CL 106  CO2 23  GLUCOSE 113*  BUN 22  CREATININE 1.05*  CALCIUM 8.9   Liver Function Tests: Recent Labs  Lab 12/07/21 2023  AST 28  ALT 20  ALKPHOS 48  BILITOT 0.4  PROT 7.4  ALBUMIN 4.0   No results for input(s): "LIPASE", "AMYLASE" in the last 168 hours. No results for input(s): "AMMONIA" in the last 168 hours. CBC: Recent Labs  Lab 12/07/21 2023  WBC 15.9*  NEUTROABS 13.2*  HGB 13.0  HCT 40.3  MCV 91.2  PLT 278   Cardiac Enzymes: No results for input(s): "CKTOTAL", "CKMB", "CKMBINDEX", "TROPONINI" in the last 168 hours.  BNP (last 3 results) Recent Labs    12/07/21 2023  BNP 196.0*    ProBNP (last 3 results) No results for input(s): "PROBNP" in the last 8760 hours.  CBG: No results for input(s): "GLUCAP" in the last 168 hours.  Radiological Exams on Admission: DG Chest 2 View  Result Date: 12/07/2021 CLINICAL DATA:  Shortness of breath and cough x3 weeks. EXAM: CHEST - 2 VIEW COMPARISON:  December 05, 2021 FINDINGS: The heart size and mediastinal contours are within normal limits. There is mild calcification of the aortic arch. Moderate severity diffuse, chronic appearing increased interstitial lung markings are seen. Mild atelectasis and/or infiltrate is noted within the left lung base. There is no evidence of a pleural effusion or pneumothorax. Multilevel degenerative changes are noted throughout the thoracic spine. IMPRESSION: 1. Chronic appearing increased interstitial lung markings. 2. Mild left basilar atelectasis and/or infiltrate. Electronically Signed   By: Aram Candela M.D.   On: 12/07/2021 19:36    EKG: I independently viewed the EKG done and my findings are as followed: Normal sinus rhythm at rate of 85 bpm  Assessment/Plan Present on  Admission:  CAP (community acquired pneumonia)  Principal Problem:   CAP (community acquired pneumonia) Active Problems:   GERD (gastroesophageal reflux disease)   Asthma, chronic, unspecified asthma severity, with acute exacerbation   Acute respiratory failure with hypoxia (HCC)   Elevated WBC count   Elevated brain natriuretic peptide (BNP) level   Essential hypertension   Mixed hyperlipidemia  Acute respiratory failure with hypoxia possibly due to CAP POA Chest x-ray was suggestive of mild left basilar atelectasis and/or infiltrate. Patient was started on ceftriaxone and doxycycline, we shall continue same at this time with plan to de-escalate/discontinue based on blood culture, sputum culture, urine Legionella, strep pneumo and procalcitonin Continue Tylenol as needed Continue Mucinex, Robitussin, incentive spirometry, flutter valve  Continue supplemental oxygen to maintain O2 sat > 94% with plan to wean patient off oxygen as tolerated  Elevated WBC possibly secondary to above WBC 15.9, continue treatment as indicated above  Elevated BNP rule out CHF BNP 196 Continue total input/output, daily weights and fluid restriction Continue Cardiac diet  Echocardiogram in the morning   Chronic asthma with acute exacerbation Continue duo nebs, Mucinex, Dulera, Solu-Medrol. Continue Protonix to prevent steroid-induced ulcer Continue incentive spirometry and flutter valve Continue supplemental oxygen to maintain O2 sat > 94% with plan to wean patient off oxygen as tolerated  GERD Continue Protonix  Essential hypertension Continue Hyzaar  Mixed hyperlipidemia Continue Zocor  DVT prophylaxis: Lovenox  Code Status: Full code  Consults: None  Family Communication: Granddaughter at bedside (all questions answered to satisfaction)  Severity of Illness: The appropriate patient status for this patient is INPATIENT. Inpatient status is judged to be reasonable and necessary in  order to provide the required intensity of service to ensure the patient's safety. The patient's presenting symptoms, physical exam findings, and initial radiographic and laboratory data in the context of their chronic comorbidities is felt to place them at high risk for further clinical deterioration. Furthermore, it is not anticipated that the patient will be medically stable for discharge from the hospital within 2 midnights of admission.   * I certify that at the point of admission it is my clinical judgment that the patient will require inpatient hospital care spanning beyond 2 midnights from the point of admission due to high intensity of service, high risk for further deterioration and high frequency of surveillance required.*  Author: Frankey Shown, DO 12/08/2021 1:07 AM  For on call review [email or web address].

## 2021-12-08 NOTE — Progress Notes (Signed)
Patient seen and evaluated, chart reviewed, please see EMR for updated orders. Please see full H&P dictated by admitting physician Dr Thomes Dinning for same date of service.   Brief Summary:- 86 y.o. female with medical history significant of chronic asthma, allergic rhinosinusitis, GERD, hypertension, hyperlipidemia admitted on 12/08/2021 with acute hypoxic respiratory failure secondary to presumed pneumonia and CHF   A/p 1) acute hypoxic respiratory failure--suspect due to combination of pneumonia and CHF -Initially required BiPAP -Currently on nasal cannula   2)CAP-presumed pneumonia--continue Rocephin and doxycycline -WBC 15.9 >> 16.9 -Consider repeat chest x-ray after diuresis  3) acute on chronic diastolic CHF exacerbation -Echo from 12/08/2021 with EF of 65 to 70% with grade 1 diastolic dysfunction -Gentle diuresis  4) acute anemia--Hgb 11.8 down from baseline between 13 and 14 -No evidence of acute bleeding suspect may be related to fluids/hemodilution  5)HTN--stage II, BP improving  -Total Care time over 43 minutes  Shon Hale, MD

## 2021-12-08 NOTE — Progress Notes (Incomplete)
*  PRELIMINARY RESULTS* Echocardiogram 2D Echocardiogram has been performed.  Deborah Branch 12/08/2021, 12:52 PM

## 2021-12-09 LAB — CBC
HCT: 37.3 % (ref 36.0–46.0)
Hemoglobin: 12.1 g/dL (ref 12.0–15.0)
MCH: 29.4 pg (ref 26.0–34.0)
MCHC: 32.4 g/dL (ref 30.0–36.0)
MCV: 90.5 fL (ref 80.0–100.0)
Platelets: 266 10*3/uL (ref 150–400)
RBC: 4.12 MIL/uL (ref 3.87–5.11)
RDW: 14.4 % (ref 11.5–15.5)
WBC: 12.5 10*3/uL — ABNORMAL HIGH (ref 4.0–10.5)
nRBC: 0 % (ref 0.0–0.2)

## 2021-12-09 LAB — BASIC METABOLIC PANEL
Anion gap: 7 (ref 5–15)
BUN: 20 mg/dL (ref 8–23)
CO2: 24 mmol/L (ref 22–32)
Calcium: 8.2 mg/dL — ABNORMAL LOW (ref 8.9–10.3)
Chloride: 108 mmol/L (ref 98–111)
Creatinine, Ser: 0.81 mg/dL (ref 0.44–1.00)
GFR, Estimated: >60 mL/min (ref >60)
Glucose, Bld: 129 mg/dL — ABNORMAL HIGH (ref 70–99)
Potassium: 3.6 mmol/L (ref 3.5–5.1)
Sodium: 139 mmol/L (ref 135–145)

## 2021-12-09 MED ORDER — IPRATROPIUM-ALBUTEROL 0.5-2.5 (3) MG/3ML IN SOLN: 3.0000 mL | Freq: Four times a day (QID) | RESPIRATORY_TRACT | 2 refills | Status: DC | PRN

## 2021-12-09 MED ORDER — CEFDINIR 300 MG PO CAPS: 300.0000 mg | ORAL_CAPSULE | Freq: Two times a day (BID) | ORAL | 0 refills | Status: AC

## 2021-12-09 MED ORDER — ASPIRIN 81 MG PO TABS: 81.0000 mg | ORAL_TABLET | Freq: Every day | ORAL | 3 refills | Status: DC

## 2021-12-09 MED ORDER — LISINOPRIL 40 MG PO TABS: 40.0000 mg | ORAL_TABLET | Freq: Every day | ORAL | 3 refills | Status: DC

## 2021-12-09 MED ORDER — ALBUTEROL SULFATE HFA 108 (90 BASE) MCG/ACT IN AERS: 2.0000 | INHALATION_SPRAY | Freq: Four times a day (QID) | RESPIRATORY_TRACT | 12 refills | Status: DC | PRN

## 2021-12-09 MED ORDER — FUROSEMIDE 20 MG PO TABS: 20.0000 mg | ORAL_TABLET | Freq: Every day | ORAL | 11 refills | Status: DC

## 2021-12-09 MED ORDER — DOXYCYCLINE HYCLATE 100 MG PO TABS
100.0000 mg | ORAL_TABLET | Freq: Once | ORAL | Status: AC
  Administered 2021-12-09: 100 mg via ORAL
  Filled 2021-12-09: qty 1

## 2021-12-09 MED ORDER — PREDNISONE 20 MG PO TABS: 40.0000 mg | ORAL_TABLET | Freq: Every day | ORAL | 0 refills | Status: AC

## 2021-12-09 MED ORDER — BUDESONIDE-FORMOTEROL FUMARATE 80-4.5 MCG/ACT IN AERO: INHALATION_SPRAY | RESPIRATORY_TRACT | 5 refills | Status: DC

## 2021-12-09 MED ORDER — DOXYCYCLINE HYCLATE 100 MG PO TABS: 100.0000 mg | ORAL_TABLET | Freq: Two times a day (BID) | ORAL | 0 refills | Status: AC

## 2021-12-09 NOTE — Progress Notes (Signed)
Patient ambulated around unit twice on room air. O2 sat remained between 94-97%. Patient ambulated independently with steady gait. No complaints of shortness of breath, chest pain, dizziness, nausea or vomiting. Dr Marisa Severin made aware.

## 2021-12-09 NOTE — Discharge Instructions (Signed)
1) please note that there has been changes to your blood pressure medications 2) please follow-up to primary care physician within a week for recheck and reevaluation 3) low-salt/low-sodium diet advised

## 2021-12-09 NOTE — Discharge Summary (Incomplete)
Deborah Branch, is a 87 y.o. female  DOB 1935-01-20  MRN [place]:[id number].  Admission date:  12/07/2021  Admitting Physician  Bernadette Hoit, DO  Discharge Date:  12/09/2021   Primary MD  Pcp, No  Recommendations for primary care physician for things to follow:   1) please note that there has been changes to your blood pressure medications 2) please follow-up to primary care physician within a week for recheck and reevaluation 3) low-salt/low-sodium diet advised  Admission Diagnosis  CAP (community acquired pneumonia) [J18.9] Community acquired pneumonia of left lower lobe of lung [J18.9]   Discharge Diagnosis  CAP (community acquired pneumonia) [J18.9] Community acquired pneumonia of left lower lobe of lung [J18.9]  ***  Principal Problem:   CAP (community acquired pneumonia) Active Problems:   GERD (gastroesophageal reflux disease)   Asthma, chronic, unspecified asthma severity, with acute exacerbation   Acute respiratory failure with hypoxia (HCC)   Elevated WBC count   Elevated brain natriuretic peptide (BNP) level   Essential hypertension   Mixed hyperlipidemia      Past Medical History:  Diagnosis Date   Allergic asthma    GERD (gastroesophageal reflux disease)    Hyperlipidemia    Hypertension    Osteoporosis     Past Surgical History:  Procedure Laterality Date   CATARACT EXTRACTION, BILATERAL  2012       HPI  from the history and physical done on the day of admission:     ***  ****     Hospital Course:     No notes on file  ***** Assessment and Plan: No notes have been filed under this hospital service. Service: Hospitalist       Discharge Condition: ***  Follow UP     Consults obtained - ***  Diet and Activity recommendation:  As advised  Discharge Instructions    **** Discharge Instructions     Call MD for:  difficulty breathing, headache or  visual disturbances   Complete by: As directed    Call MD for:  persistant dizziness or light-headedness   Complete by: As directed    Call MD for:  persistant nausea and vomiting   Complete by: As directed    Call MD for:  temperature >100.4   Complete by: As directed    Diet - low sodium heart healthy   Complete by: As directed    Discharge instructions   Complete by: As directed    1) please note that there has been changes to your blood pressure medications 2) please follow-up to primary care physician within a week for recheck and reevaluation 3) low-salt/low-sodium diet advised   Increase activity slowly   Complete by: As directed          Discharge Medications     Allergies as of 12/09/2021       Reactions   Clarithromycin    REACTION: itching   Penicillins    REACTION: hives        Medication List  STOP taking these medications    clotrimazole 10 MG troche Commonly known as: MYCELEX   losartan-hydrochlorothiazide 100-12.5 MG tablet Commonly known as: HYZAAR       TAKE these medications    albuterol 108 (90 Base) MCG/ACT inhaler Commonly known as: VENTOLIN HFA Inhale 2 puffs into the lungs every 6 (six) hours as needed for wheezing or shortness of breath (cough). What changed: See the new instructions.   aspirin 81 MG tablet Take 1 tablet (81 mg total) by mouth daily with breakfast. What changed: when to take this   budesonide-formoterol 80-4.5 MCG/ACT inhaler Commonly known as: Symbicort INHALE TWO PUFFS INTO THE LUNGS TWICE DAILY   calcium carbonate 1500 (600 Ca) MG Tabs tablet Commonly known as: OSCAL Take 1 tablet by mouth 2 (two) times daily with a meal.   cefdinir 300 MG capsule Commonly known as: OMNICEF Take 1 capsule (300 mg total) by mouth 2 (two) times daily for 5 days.   denosumab 60 MG/ML Sosy injection Commonly known as: PROLIA Inject 60 mg into the skin every 6 (six) months.   doxycycline 100 MG tablet Commonly  known as: VIBRA-TABS Take 1 tablet (100 mg total) by mouth 2 (two) times daily for 5 days.   escitalopram 5 MG tablet Commonly known as: LEXAPRO Take 5 mg by mouth daily.   furosemide 20 MG tablet Commonly known as: Lasix Take 1 tablet (20 mg total) by mouth daily.   ipratropium-albuterol 0.5-2.5 (3) MG/3ML Soln Commonly known as: DUONEB Take 3 mLs by nebulization every 6 (six) hours as needed (shortness of breath and cough).   lisinopril 40 MG tablet Commonly known as: ZESTRIL Take 1 tablet (40 mg total) by mouth daily.   omeprazole 20 MG capsule Commonly known as: PRILOSEC Take 20 mg by mouth daily.   predniSONE 20 MG tablet Commonly known as: DELTASONE Take 2 tablets (40 mg total) by mouth daily with breakfast for 5 days. What changed:  medication strength how much to take how to take this when to take this additional instructions   simvastatin 20 MG tablet Commonly known as: ZOCOR Take 20 mg by mouth at bedtime.   VITAMIN K2 PO Take 100 mcg by mouth daily.   XOLAIR [place] Inject 225 mg into the skin every 3 (three) months.   zinc gluconate 50 MG tablet Take 50 mg by mouth daily.        Major procedures and Radiology Reports - PLEASE review detailed and final reports for all details, in brief -   ***  ECHOCARDIOGRAM COMPLETE  Result Date: 12/08/2021    ECHOCARDIOGRAM REPORT   Patient Name:   Deborah Branch Date of Exam: 12/08/2021 Medical Rec #:  [id number]    Height:       60.0 in Accession #:    [id number]   Weight:       162.5 lb Date of Birth:  [birth year]    BSA:          1.709 m Patient Age:    87 years     BP:           133/91 mmHg Patient Gender: F            HR:           80 bpm. [facility] Procedure: 2D Echo, Cardiac Doppler and Color Doppler Indications:    CHF  History:        Patient has no prior history of Echocardiogram examinations.  Risk Factors:Hypertension and Dyslipidemia.  Sonographer:    Wenda Low Referring  Phys: [id number] OLADAPO ADEFESO IMPRESSIONS  1. Left ventricular ejection fraction, by estimation, is 65 to 70%. Left ventricular ejection fraction by 2D MOD biplane is 70.0 %. The left ventricle has normal function. The left ventricle has no regional wall motion abnormalities. Left ventricular diastolic parameters are consistent with Grade I diastolic dysfunction (impaired relaxation).  2. Right ventricular systolic function is normal. The right ventricular size is normal. There is normal pulmonary artery systolic pressure. The estimated right ventricular systolic pressure is 0000000 mmHg.  3. The mitral valve is abnormal. Trivial mitral valve regurgitation.  4. The aortic valve is tricuspid. Aortic valve regurgitation is not visualized. Aortic valve sclerosis/calcification is present, without any evidence of aortic stenosis. Aortic valve mean gradient measures 8.0 mmHg.  5. The inferior vena cava is normal in size with greater than 50% respiratory variability, suggesting right atrial pressure of 3 mmHg. Comparison(s): No prior Echocardiogram. FINDINGS  Left Ventricle: Left ventricular ejection fraction, by estimation, is 65 to 70%. Left ventricular ejection fraction by 2D MOD biplane is 70.0 %. The left ventricle has normal function. The left ventricle has no regional wall motion abnormalities. The left ventricular internal cavity size was normal in size. There is no left ventricular hypertrophy. Left ventricular diastolic parameters are consistent with Grade I diastolic dysfunction (impaired relaxation). Indeterminate filling pressures. Right Ventricle: The right ventricular size is normal. No increase in right ventricular wall thickness. Right ventricular systolic function is normal. There is normal pulmonary artery systolic pressure. The tricuspid regurgitant velocity is 2.67 m/s, and  with an assumed right atrial pressure of 3 mmHg, the estimated right ventricular systolic pressure is 0000000 mmHg. Left Atrium: Left  atrial size was normal in size. Right Atrium: Right atrial size was normal in size. Pericardium: There is no evidence of pericardial effusion. Mitral Valve: The mitral valve is abnormal. Mild mitral annular calcification. Trivial mitral valve regurgitation. MV peak gradient, 7.4 mmHg. The mean mitral valve gradient is 3.0 mmHg. Tricuspid Valve: The tricuspid valve is grossly normal. Tricuspid valve regurgitation is trivial. Aortic Valve: The aortic valve is tricuspid. Aortic valve regurgitation is not visualized. Aortic valve sclerosis/calcification is present, without any evidence of aortic stenosis. Aortic valve mean gradient measures 8.0 mmHg. Aortic valve peak gradient measures 15.1 mmHg. Aortic valve area, by VTI measures 2.06 cm. Pulmonic Valve: The pulmonic valve was normal in structure. Pulmonic valve regurgitation is not visualized. Aorta: The aortic root and ascending aorta are structurally normal, with no evidence of dilitation. Venous: The inferior vena cava is normal in size with greater than 50% respiratory variability, suggesting right atrial pressure of 3 mmHg. IAS/Shunts: No atrial level shunt detected by color flow Doppler.  LEFT VENTRICLE PLAX 2D                        Biplane EF (MOD) LVIDd:         4.20 cm         LV Biplane EF:   Left LVIDs:         2.60 cm                          ventricular LV PW:         1.10 cm  ejection LV IVS:        1.00 cm                          fraction by LVOT diam:     1.90 cm                          2D MOD LV SV:         93                               biplane is LV SV Index:   54                               70.0 %. LVOT Area:     2.84 cm                                Diastology                                LV e' medial:    9.03 cm/s LV Volumes (MOD)               LV E/e' medial:  13.6 LV vol d, MOD    50.2 ml       LV e' lateral:   9.03 cm/s A2C:                           LV E/e' lateral: 13.6 LV vol d, MOD    38.0 ml A4C: LV vol  s, MOD    15.5 ml A2C: LV vol s, MOD    10.6 ml A4C: LV SV MOD A2C:   34.7 ml LV SV MOD A4C:   38.0 ml LV SV MOD BP:    31.4 ml RIGHT VENTRICLE RV Basal diam:  2.95 cm RV Mid diam:    2.60 cm RV S prime:     12.70 cm/s TAPSE (M-mode): 2.5 cm LEFT ATRIUM             Index        RIGHT ATRIUM           Index LA diam:        4.10 cm 2.40 cm/m   RA Area:     13.30 cm LA Vol (A2C):   46.2 ml 27.04 ml/m  RA Volume:   32.20 ml  18.84 ml/m LA Vol (A4C):   43.2 ml 25.28 ml/m LA Biplane Vol: 45.6 ml 26.68 ml/m  AORTIC VALVE                     PULMONIC VALVE AV Area (Vmax):    2.00 cm      PV Vmax:       0.85 m/s AV Area (Vmean):   2.10 cm      PV Peak grad:  2.9 mmHg AV Area (VTI):     2.06 cm AV Vmax:           194.00 cm/s AV Vmean:          130.500 cm/s AV VTI:  0.451 m AV Peak Grad:      15.1 mmHg AV Mean Grad:      8.0 mmHg LVOT Vmax:         137.00 cm/s LVOT Vmean:        96.600 cm/s LVOT VTI:          0.327 m LVOT/AV VTI ratio: 0.73  AORTA Ao Root diam: 2.80 cm MITRAL VALVE                TRICUSPID VALVE MV Area (PHT): 3.77 cm     TR Peak grad:   28.5 mmHg MV Area VTI:   2.55 cm     TR Vmax:        267.00 cm/s MV Peak grad:  7.4 mmHg MV Mean grad:  3.0 mmHg     SHUNTS MV Vmax:       1.36 m/s     Systemic VTI:  0.33 m MV Vmean:      79.7 cm/s    Systemic Diam: 1.90 cm MV Decel Time: 201 msec MV E velocity: 123.00 cm/s MV A velocity: 111.00 cm/s MV E/A ratio:  1.11 Lyman Bishop MD Electronically signed by Lyman Bishop MD Signature Date/Time: 12/08/2021/5:06:03 PM    Final    DG Chest 2 View  Result Date: 12/07/2021 CLINICAL DATA:  Shortness of breath and cough x3 weeks. EXAM: CHEST - 2 VIEW COMPARISON:  December 05, 2021 FINDINGS: The heart size and mediastinal contours are within normal limits. There is mild calcification of the aortic arch. Moderate severity diffuse, chronic appearing increased interstitial lung markings are seen. Mild atelectasis and/or infiltrate is noted within the left lung  base. There is no evidence of a pleural effusion or pneumothorax. Multilevel degenerative changes are noted throughout the thoracic spine. IMPRESSION: 1. Chronic appearing increased interstitial lung markings. 2. Mild left basilar atelectasis and/or infiltrate. Electronically Signed   By: Virgina Norfolk M.D.   On: 12/07/2021 19:36   DG Chest 2 View  Result Date: 12/06/2021 CLINICAL DATA:  Asthma exacerbation EXAM: CHEST - 2 VIEW COMPARISON:  December 06, 2020 FINDINGS: The heart size and mediastinal contours are stable. Both lungs are clear. The visualized skeletal structures are stable. IMPRESSION: No active cardiopulmonary disease. Electronically Signed   By: Abelardo Diesel M.D.   On: 12/06/2021 09:01    Micro Results   *** Recent Results (from the past 240 hour(s))  Culture, blood (routine x 2)     Status: None (Preliminary result)   Collection Time: 12/07/21  8:23 PM   Specimen: Right Antecubital; Blood  Result Value Ref Range Status   Specimen Description RIGHT ANTECUBITAL  Final   Special Requests   Final    BOTTLES DRAWN AEROBIC AND ANAEROBIC Blood Culture results may not be optimal due to an excessive volume of blood received in culture bottles   Culture   Final    NO GROWTH 2 DAYS Performed at [facility], [address] [place], [place] [address]    Report Status PENDING  Incomplete  Culture, blood (routine x 2)     Status: None (Preliminary result)   Collection Time: 12/07/21  8:23 PM   Specimen: Left Antecubital; Blood  Result Value Ref Range Status   Specimen Description LEFT ANTECUBITAL  Final   Special Requests   Final    BOTTLES DRAWN AEROBIC AND ANAEROBIC Blood Culture adequate volume   Culture   Final    NO GROWTH 2 DAYS Performed at [facility],  [address] [place], [place] [address]    Report Status PENDING  Incomplete  Resp Panel by RT-PCR (Flu A&B, Covid) Anterior Nasal Swab     Status: None   Collection Time: 12/07/21  8:35 PM   Specimen: Anterior Nasal  Swab  Result Value Ref Range Status   SARS Coronavirus 2 by RT PCR NEGATIVE NEGATIVE Final    Comment: (NOTE) SARS-CoV-2 target nucleic acids are NOT DETECTED.  The SARS-CoV-2 RNA is generally detectable in upper respiratory specimens during the acute phase of infection. The lowest concentration of SARS-CoV-2 viral copies this assay can detect is 138 copies/mL. A negative result does not preclude SARS-Cov-2 infection and should not be used as the sole basis for treatment or other patient management decisions. A negative result may occur with  improper specimen collection/handling, submission of specimen other than nasopharyngeal swab, presence of viral mutation(s) within the areas targeted by this assay, and inadequate number of viral copies(<138 copies/mL). A negative result must be combined with clinical observations, patient history, and epidemiological information. The expected result is Negative.  Fact Sheet for Patients:  BloggerCourse.com  Fact Sheet for Healthcare Providers:  SeriousBroker.it  This test is no t yet approved or cleared by the [place] FDA and  has been authorized for detection and/or diagnosis of SARS-CoV-2 by FDA under an Emergency Use Authorization (EUA). This EUA will remain  in effect (meaning this test can be used) for the duration of the COVID-19 declaration under Section 564(b)(1) of the Act, 21 U.S.C.section 360bbb-3(b)(1), unless the authorization is terminated  or revoked sooner.       Influenza A by PCR NEGATIVE NEGATIVE Final   Influenza B by PCR NEGATIVE NEGATIVE Final    Comment: (NOTE) The Xpert Xpress SARS-CoV-2/FLU/RSV plus assay is intended as an aid in the diagnosis of influenza from Nasopharyngeal swab specimens and should not be used as a sole basis for treatment. Nasal washings and aspirates are unacceptable for Xpert Xpress SARS-CoV-2/FLU/RSV testing.  Fact Sheet for  Patients: BloggerCourse.com  Fact Sheet for Healthcare Providers: SeriousBroker.it  This test is not yet approved or cleared by the [place] FDA and has been authorized for detection and/or diagnosis of SARS-CoV-2 by FDA under an Emergency Use Authorization (EUA). This EUA will remain in effect (meaning this test can be used) for the duration of the COVID-19 declaration under Section 564(b)(1) of the Act, 21 U.S.C. section 360bbb-3(b)(1), unless the authorization is terminated or revoked.  Performed at [facility], [address], [place], [place] [address]   Respiratory (~20 pathogens) panel by PCR     Status: None   Collection Time: 12/07/21  8:35 PM   Specimen: Nasopharyngeal Swab; Respiratory  Result Value Ref Range Status   Adenovirus NOT DETECTED NOT DETECTED Final   Coronavirus 229E NOT DETECTED NOT DETECTED Final    Comment: (NOTE) The Coronavirus on the Respiratory Panel, DOES NOT test for the novel  Coronavirus (2019 nCoV)    Coronavirus HKU1 NOT DETECTED NOT DETECTED Final   Coronavirus NL63 NOT DETECTED NOT DETECTED Final   Coronavirus OC43 NOT DETECTED NOT DETECTED Final   Metapneumovirus NOT DETECTED NOT DETECTED Final   Rhinovirus / Enterovirus NOT DETECTED NOT DETECTED Final   Influenza A NOT DETECTED NOT DETECTED Final   Influenza B NOT DETECTED NOT DETECTED Final   Parainfluenza Virus 1 NOT DETECTED NOT DETECTED Final   Parainfluenza Virus 2 NOT DETECTED NOT DETECTED Final   Parainfluenza Virus 3 NOT DETECTED NOT DETECTED Final  Parainfluenza Virus 4 NOT DETECTED NOT DETECTED Final   Respiratory Syncytial Virus NOT DETECTED NOT DETECTED Final   Bordetella pertussis NOT DETECTED NOT DETECTED Final   Bordetella Parapertussis NOT DETECTED NOT DETECTED Final   Chlamydophila pneumoniae NOT DETECTED NOT DETECTED Final   Mycoplasma pneumoniae NOT DETECTED NOT DETECTED Final    Comment: Performed at [facility], [place] [address], [place], [place] [address]  MRSA Next Gen by PCR, Nasal     Status: None   Collection Time: 12/08/21  3:05 AM   Specimen: Nasal Mucosa; Nasal Swab  Result Value Ref Range Status   MRSA by PCR Next Gen NOT DETECTED NOT DETECTED Final    Comment: (NOTE) The GeneXpert MRSA Assay (FDA approved for NASAL specimens only), is one component of a comprehensive MRSA colonization surveillance program. It is not intended to diagnose MRSA infection nor to guide or monitor treatment for MRSA infections. Test performance is not FDA approved in patients less than 68 years old. Performed at [facility], [address], [place], [place] [address]   Expectorated Sputum Assessment w Gram Stain, Rflx to Resp Cult     Status: None   Collection Time: 12/08/21  5:00 AM   Specimen: Expectorated Sputum  Result Value Ref Range Status   Specimen Description EXPECTORATED SPUTUM  Final   Special Requests NONE  Final   Sputum evaluation   Final    THIS SPECIMEN IS ACCEPTABLE FOR SPUTUM CULTURE Performed at [facility], [address] [place], [place] [address]    Report Status 12/08/2021 FINAL  Final  Culture, Respiratory w Gram Stain     Status: None (Preliminary result)   Collection Time: 12/08/21  5:00 AM  Result Value Ref Range Status   Specimen Description   Final    EXPECTORATED SPUTUM Performed at [facility], [address], [place], [place] [address]    Special Requests   Final    NONE Reflexed from [id number] Performed at [facility], [address], [place], [place] [address]    Gram Stain   Final    FEW WBC PRESENT, PREDOMINANTLY PMN FEW GRAM POSITIVE COCCI IN PAIRS RARE BUDDING YEAST SEEN RARE GRAM NEGATIVE RODS Performed at [facility], [place] [address] [place], [place] [address]    Culture PENDING  Incomplete   Report Status PENDING  Incomplete    Today   Subjective    Deborah Branch today has no ***          Patient has been seen and examined prior to  discharge   Objective   Blood pressure (!) 154/53, pulse 71, temperature 98.5 F (36.9 C), temperature source Oral, resp. rate 20, height 5' (1.524 m), weight 73.3 kg, SpO2 96 %.   Intake/Output Summary (Last 24 hours) at 12/09/2021 1021 Last data filed at 12/09/2021 0600 Gross per 24 hour  Intake 655.71 ml  Output --  Net 655.71 ml    Exam Gen:- Awake Alert, no acute distress *** HEENT:- [place].AT, No sclera icterus Neck-Supple Neck,No JVD,.  Lungs-  CTAB , good air movement bilaterally CV- S1, S2 normal, regular Abd-  +ve B.Sounds, Abd Soft, No tenderness,    Extremity/Skin:- No  edema,   good pulses Psych-affect is appropriate, oriented x3 Neuro-no new focal deficits, no tremors ***   Data Review   CBC w Diff:  Lab Results  Component Value Date   WBC 12.5 (H) 12/09/2021   HGB 12.1 12/09/2021   HCT 37.3  12/09/2021   PLT 266 12/09/2021   LYMPHOPCT 10 12/07/2021   MONOPCT 6 12/07/2021   EOSPCT 0 12/07/2021   BASOPCT 0 12/07/2021    CMP:  Lab Results  Component Value Date   NA 139 12/09/2021   K 3.6 12/09/2021   CL 108 12/09/2021   CO2 24 12/09/2021   BUN 20 12/09/2021   CREATININE 0.81 12/09/2021   PROT 6.2 (L) 12/08/2021   ALBUMIN 3.3 (L) 12/08/2021   BILITOT 0.5 12/08/2021   ALKPHOS 42 12/08/2021   AST 21 12/08/2021   ALT 16 12/08/2021  .  Total Discharge time is about 33 minutes  Roxan Hockey M.D on 12/09/2021 at 10:21 AM  Go to [email or web address] -  for contact info  [facility] - Office  [phone]

## 2021-12-09 NOTE — Progress Notes (Signed)
Discharge instructions reviewed with patient. Patient verbalized understanding of instructions. Patient discharged home with family in stable condition   

## 2021-12-10 LAB — CULTURE, RESPIRATORY W GRAM STAIN: Culture: NORMAL

## 2021-12-12 LAB — CULTURE, BLOOD (ROUTINE X 2)
Culture: NO GROWTH
Culture: NO GROWTH
Special Requests: ADEQUATE

## 2021-12-12 LAB — LEGIONELLA PNEUMOPHILA SEROGP 1 UR AG: L. pneumophila Serogp 1 Ur Ag: NEGATIVE

## 2021-12-15 DIAGNOSIS — M810 Age-related osteoporosis without current pathological fracture: Secondary | ICD-10-CM | POA: Diagnosis not present

## 2021-12-15 DIAGNOSIS — J4550 Severe persistent asthma, uncomplicated: Secondary | ICD-10-CM | POA: Diagnosis not present

## 2021-12-15 DIAGNOSIS — M545 Low back pain, unspecified: Secondary | ICD-10-CM | POA: Diagnosis not present

## 2021-12-15 DIAGNOSIS — R7309 Other abnormal glucose: Secondary | ICD-10-CM | POA: Diagnosis not present

## 2021-12-15 DIAGNOSIS — Z6831 Body mass index (BMI) 31.0-31.9, adult: Secondary | ICD-10-CM | POA: Diagnosis not present

## 2021-12-16 ENCOUNTER — Ambulatory Visit (INDEPENDENT_AMBULATORY_CARE_PROVIDER_SITE_OTHER): Payer: Medicare PPO

## 2021-12-16 VITALS — BP 168/60 | HR 72 | Temp 98.7°F | Resp 25 | Ht 60.0 in | Wt 161.0 lb

## 2021-12-16 DIAGNOSIS — J4540 Moderate persistent asthma, uncomplicated: Secondary | ICD-10-CM

## 2021-12-16 MED ORDER — OMALIZUMAB 75 MG/0.5ML ~~LOC~~ SOSY
225.0000 mg | PREFILLED_SYRINGE | Freq: Once | SUBCUTANEOUS | Status: AC
  Administered 2021-12-16: 225 mg via SUBCUTANEOUS

## 2021-12-18 NOTE — Progress Notes (Signed)
Cardiology Office Note:   Date:  12/19/2021  NAME:  Deborah Branch    MRN: [id number] DOB:  07/24/1934   PCP:  Gabriel Earing, FNP  Cardiologist:  None  Electrophysiologist:  None   Referring MD: Shon Hale, MD   Chief Complaint  Patient presents with   Congestive Heart Failure   History of Present Illness:   Deborah Branch is a 87 y.o. female with a hx of asthma, HTN who is being seen today for the evaluation of diastolic CHF at the request of Shon Hale, MD. Admitted to hospital at [facility] for PNA. Had elevated BNP and underwent diuresis. Referral for diastolic chf.   She was admitted to the hospital for pneumonia.  Chest x-ray showed no evidence of pulmonary edema.  She had a minimally elevated BNP at 198.  She was given a dose of IV diuresis with improvement in her symptoms.  She now continues to have a cough.  She is taking Lasix 20 mg daily with no improvement in her cough.  She was euvolemic on exam.  She does not appear volume overloaded to me.  Her blood pressure is elevated.  She is still getting over pneumonia and has asthma.  No prior history of congestive heart failure.  Her echo and clinical course was reviewed with her son in the office today.  She had normal pulmonary pressures.  It is unclear to me if she actually has congestive heart failure.  We discussed taking her Lasix as needed.  We also discussed adding amlodipine.  She has never had a heart attack or stroke.  There is apparently heart disease in her grandfather.  Her EKG from the hospital showed normal sinus rhythm with no acute ischemic changes.  Her exam is normal today.  She still has a cough which I suspect is related to pneumonia.  She is completing a course of antibiotics.  She is a never smoker.  No alcohol or drug use is reported.  Her granddaughter works at [employer] in device clinic.  Blood pressure elevated today.  Has been running high at home.  We discussed adding amlodipine.  She  is okay to do this.  She overall reports she has no shortness of breath.  No lower extremity edema.  She reports no chest pain.  Her main symptoms is persistent cough.  Problem List HTN Asthma GERD HFpEF  Past Medical History: Past Medical History:  Diagnosis Date   Allergic asthma    GERD (gastroesophageal reflux disease)    Hyperlipidemia    Hypertension    Osteoporosis     Past Surgical History: Past Surgical History:  Procedure Laterality Date   CATARACT EXTRACTION, BILATERAL  2012    Current Medications: Current Meds  Medication Sig   albuterol (VENTOLIN HFA) 108 (90 Base) MCG/ACT inhaler Inhale 2 puffs into the lungs every 6 (six) hours as needed for wheezing or shortness of breath (cough).   amLODipine (NORVASC) 5 MG tablet Take 1 tablet (5 mg total) by mouth daily.   budesonide-formoterol (SYMBICORT) 80-4.5 MCG/ACT inhaler INHALE TWO PUFFS INTO THE LUNGS TWICE DAILY   calcium carbonate (OSCAL) 1500 (600 Ca) MG TABS tablet Take 1 tablet by mouth 2 (two) times daily with a meal.   denosumab (PROLIA) 60 MG/ML SOSY injection Inject 60 mg into the skin every 6 (six) months.   escitalopram (LEXAPRO) 5 MG tablet Take 5 mg by mouth daily.   ipratropium-albuterol (DUONEB) 0.5-2.5 (3) MG/3ML SOLN  Take 3 mLs by nebulization every 6 (six) hours as needed (shortness of breath and cough).   lisinopril (ZESTRIL) 40 MG tablet Take 1 tablet (40 mg total) by mouth daily.   Menaquinone-7 (VITAMIN K2 PO) Take 100 mcg by mouth daily.   Omalizumab (XOLAIR [place]) Inject 225 mg into the skin every 3 (three) months.   omeprazole (PRILOSEC) 20 MG capsule Take 20 mg by mouth daily.     simvastatin (ZOCOR) 20 MG tablet Take 20 mg by mouth at bedtime.     zinc gluconate 50 MG tablet Take 50 mg by mouth daily.   [DISCONTINUED] aspirin 81 MG tablet Take 1 tablet (81 mg total) by mouth daily with breakfast.   [DISCONTINUED] furosemide (LASIX) 20 MG tablet Take 1 tablet (20 mg total) by mouth daily.      Allergies:    Clarithromycin and Penicillins   Social History: Social History   Socioeconomic History   Marital status: Widowed    Spouse name: Not on file   Number of children: 3   Years of education: Not on file   Highest education level: Not on file  Occupational History   Occupation: Retired  Tobacco Use   Smoking status: Former    Types: Cigarettes   Smokeless tobacco: Never   Tobacco comments:    States she did it socially  Building services engineer Use: Never used  Substance and Sexual Activity   Alcohol use: Yes    Comment: margarita every month   Drug use: No   Sexual activity: Not on file  Other Topics Concern   Not on file  Social History Narrative   Not on file   Social Determinants of Health   Financial Resource Strain: Not on file  Food Insecurity: Not on file  Transportation Needs: Not on file  Physical Activity: Not on file  Stress: Not on file  Social Connections: Not on file     Family History: The patient's family history includes Heart attack in her paternal grandfather. There is no history of Colon cancer, Esophageal cancer, Rectal cancer, or Stomach cancer.  ROS:   All other ROS reviewed and negative. Pertinent positives noted in the HPI.     EKGs/Labs/Other Studies Reviewed:   The following studies were personally reviewed by me today:  EKG:  EKG reviewed from the emergency room dated 12/07/2021.  This EKG demonstrates sinus rhythm with no acute ischemic changes  TTE 12/08/2021  1. Left ventricular ejection fraction, by estimation, is 65 to 70%. Left  ventricular ejection fraction by 2D MOD biplane is 70.0 %. The left  ventricle has normal function. The left ventricle has no regional wall  motion abnormalities. Left ventricular  diastolic parameters are consistent with Grade I diastolic dysfunction  (impaired relaxation).   2. Right ventricular systolic function is normal. The right ventricular  size is normal. There is normal pulmonary  artery systolic pressure. The  estimated right ventricular systolic pressure is 31.5 mmHg.   3. The mitral valve is abnormal. Trivial mitral valve regurgitation.   4. The aortic valve is tricuspid. Aortic valve regurgitation is not  visualized. Aortic valve sclerosis/calcification is present, without any  evidence of aortic stenosis. Aortic valve mean gradient measures 8.0 mmHg.   5. The inferior vena cava is normal in size with greater than 50%  respiratory variability, suggesting right atrial pressure of 3 mmHg.   Recent Labs: 12/07/2021: B Natriuretic Peptide 196.0 12/08/2021: ALT 16; Magnesium 1.8 12/09/2021: BUN 20; Creatinine,  Ser 0.81; Hemoglobin 12.1; Platelets 266; Potassium 3.6; Sodium 139   Recent Lipid Panel No results found for: "CHOL", "TRIG", "HDL", "CHOLHDL", "VLDL", "LDLCALC", "LDLDIRECT"  Physical Exam:   VS:  BP (!) 150/70   Pulse 70   Ht 5' (1.524 m)   Wt 157 lb (71.2 kg)   BMI 30.66 kg/m    Wt Readings from Last 3 Encounters:  12/19/21 157 lb (71.2 kg)  12/16/21 161 lb (73 kg)  12/09/21 161 lb 9.6 oz (73.3 kg)    General: Well nourished, well developed, in no acute distress Head: Atraumatic, normal size  Eyes: PEERLA, EOMI  Neck: Supple, no JVD Endocrine: No thryomegaly Cardiac: Normal S1, S2; RRR; no murmurs, rubs, or gallops Lungs: Clear to auscultation bilaterally, no wheezing, rhonchi or rales  Abd: Soft, nontender, no hepatomegaly  Ext: No edema, pulses 2+ Musculoskeletal: No deformities, BUE and BLE strength normal and equal Skin: Warm and dry, no rashes   Neuro: Alert and oriented to person, place, time, and situation, CNII-XII grossly intact, no focal deficits  Psych: Normal mood and affect   ASSESSMENT:   Deborah Branch is a 87 y.o. female who presents for the following: 1. Chronic diastolic heart failure (HCC)   2. Primary hypertension    PLAN:   1. Chronic diastolic heart failure (HCC) 2. Primary hypertension -Admitted to the hospital with  pneumonia.  Diagnosed with diastolic heart failure.  BNP was minimally elevated.  Chest x-ray showed no evidence of pulmonary edema.  Unclear if she actually has congestive heart failure.  Her echo shows normal pulmonary pressures and a collapsing IVC.  She was placed on Lasix and is continued on this.  Still has a cough.  To me I suspect her BNP is just elevated due to blood pressure.  She has no evidence of congestive heart failure on exam today.  I recommended she take Lasix as needed.  She denies any chest pain or significant shortness of breath.  She does have cough but I suspect this is pneumonia related.  Her blood pressure is also too high.  We will start amlodipine 5 mg daily.  She will continue lisinopril 40 mg daily.  I do not believe her cough is related to congestive heart failure.  I believe this is likely related to pneumonia.  She can take Lasix as needed moving forward.  She will see me back in 6 months to reassess symptoms.  I have recommended she follow-up with her pulmonologist regarding her diagnosis of pneumonia.  Disposition: Return in about 6 months (around 06/20/2022).  Medication Adjustments/Labs and Tests Ordered: Current medicines are reviewed at length with the patient today.  Concerns regarding medicines are outlined above.  No orders of the defined types were placed in this encounter.  Meds ordered this encounter  Medications   furosemide (LASIX) 20 MG tablet    Sig: Take 1 tablet (20 mg total) by mouth daily as needed.    Dispense:  30 tablet    Refill:  11   amLODipine (NORVASC) 5 MG tablet    Sig: Take 1 tablet (5 mg total) by mouth daily.    Dispense:  90 tablet    Refill:  3    Patient Instructions  Medication Instructions:   STOP ASPIRIN  CAN TAKE FUROSEMIDE AS NEEDED  START AMLODIPINE 5 MG ONCE DAILY  *If you need a refill on your cardiac medications before your next appointment, please call your pharmacy*   Follow-Up: At [facility],  you and  your health needs are our priority.  As part of our continuing mission to provide you with exceptional heart care, we have created designated Provider Care Teams.  These Care Teams include your primary Cardiologist (physician) and Advanced Practice Providers (APPs -  Physician Assistants and Nurse Practitioners) who all work together to provide you with the care you need, when you need it.  We recommend signing up for the patient portal called "MyChart".  Sign up information is provided on this After Visit Summary.  MyChart is used to connect with patients for Virtual Visits (Telemedicine).  Patients are able to view lab/test results, encounter notes, upcoming appointments, etc.  Non-urgent messages can be sent to your provider as well.   To learn more about what you can do with MyChart, go to [email or web address].    Your next appointment:   6 month(s)  The format for your next appointment:   In Person  Provider:   Lennie Odor MD      Important Information About Sugar         Signed, Gerri Spore T. Flora Lipps, MD, [facility]  [facility]  [address], [address] [place], [place] [address] [phone]  12/19/2021 11:26 AM

## 2021-12-19 ENCOUNTER — Encounter: Payer: Self-pay | Admitting: Cardiovascular Disease

## 2021-12-19 ENCOUNTER — Ambulatory Visit: Payer: Medicare PPO | Admitting: Cardiovascular Disease

## 2021-12-19 VITALS — BP 150/70 | HR 70 | Ht 60.0 in | Wt 157.0 lb

## 2021-12-19 DIAGNOSIS — I5032 Chronic diastolic (congestive) heart failure: Secondary | ICD-10-CM | POA: Diagnosis not present

## 2021-12-19 DIAGNOSIS — I10 Essential (primary) hypertension: Secondary | ICD-10-CM

## 2021-12-19 MED ORDER — FUROSEMIDE 20 MG PO TABS: 20.0000 mg | ORAL_TABLET | Freq: Every day | ORAL | 11 refills | Status: DC | PRN

## 2021-12-19 MED ORDER — AMLODIPINE BESYLATE 5 MG PO TABS: 5.0000 mg | ORAL_TABLET | Freq: Every day | ORAL | 3 refills | Status: DC

## 2021-12-29 DIAGNOSIS — S93602A Unspecified sprain of left foot, initial encounter: Secondary | ICD-10-CM | POA: Diagnosis not present

## 2021-12-30 ENCOUNTER — Ambulatory Visit (INDEPENDENT_AMBULATORY_CARE_PROVIDER_SITE_OTHER): Payer: Medicare PPO | Admitting: Family Medicine

## 2021-12-30 ENCOUNTER — Encounter: Payer: Self-pay | Admitting: Family Medicine

## 2021-12-30 VITALS — BP 116/60 | HR 78 | Temp 98.4°F | Ht 60.0 in | Wt 163.0 lb

## 2021-12-30 DIAGNOSIS — I10 Essential (primary) hypertension: Secondary | ICD-10-CM

## 2021-12-30 DIAGNOSIS — M810 Age-related osteoporosis without current pathological fracture: Secondary | ICD-10-CM

## 2021-12-30 DIAGNOSIS — R052 Subacute cough: Secondary | ICD-10-CM | POA: Diagnosis not present

## 2021-12-30 DIAGNOSIS — J4540 Moderate persistent asthma, uncomplicated: Secondary | ICD-10-CM | POA: Diagnosis not present

## 2021-12-30 DIAGNOSIS — B3731 Acute candidiasis of vulva and vagina: Secondary | ICD-10-CM

## 2021-12-30 DIAGNOSIS — I509 Heart failure, unspecified: Secondary | ICD-10-CM

## 2021-12-30 MED ORDER — BENZONATATE 100 MG PO CAPS: 100.0000 mg | ORAL_CAPSULE | Freq: Three times a day (TID) | ORAL | 0 refills | Status: DC | PRN

## 2021-12-30 MED ORDER — NYSTATIN 100000 UNIT/GM EX CREA: 1.0000 | TOPICAL_CREAM | Freq: Two times a day (BID) | CUTANEOUS | 0 refills | Status: DC

## 2021-12-30 NOTE — Patient Instructions (Signed)

## 2021-12-30 NOTE — Progress Notes (Signed)
New Patient Office Visit  Subjective    Patient ID: Deborah Branch, female    DOB: 1934-08-29  Age: 87 y.o. MRN: [id number]  CC:  Chief Complaint  Patient presents with  . New Patient (Initial Visit)    HPI Deborah Branch presents to establish care ***     12/30/2021    3:07 PM  Depression screen PHQ 2/9  Decreased Interest 0  Down, Depressed, Hopeless 0  PHQ - 2 Score 0  Altered sleeping 0  Tired, decreased energy 0  Change in appetite 0  Feeling bad or failure about yourself  0  Trouble concentrating 0  Moving slowly or fidgety/restless 0  Suicidal thoughts 0  PHQ-9 Score 0  Difficult doing work/chores Not difficult at all      12/30/2021    3:08 PM  GAD 7 : Generalized Anxiety Score  Nervous, Anxious, on Edge 0  Control/stop worrying 0  Worry too much - different things 0  Trouble relaxing 0  Restless 0  Easily annoyed or irritable 0  Afraid - awful might happen 0  Total GAD 7 Score 0  Anxiety Difficulty Not difficult at all      Outpatient Encounter Medications as of 12/30/2021  Medication Sig  . albuterol (VENTOLIN HFA) 108 (90 Base) MCG/ACT inhaler Inhale 2 puffs into the lungs every 6 (six) hours as needed for wheezing or shortness of breath (cough).  Marland Kitchen amLODipine (NORVASC) 5 MG tablet Take 1 tablet (5 mg total) by mouth daily.  . budesonide-formoterol (SYMBICORT) 80-4.5 MCG/ACT inhaler INHALE TWO PUFFS INTO THE LUNGS TWICE DAILY  . calcium carbonate (OSCAL) 1500 (600 Ca) MG TABS tablet Take 1 tablet by mouth 2 (two) times daily with a meal.  . denosumab (PROLIA) 60 MG/ML SOSY injection Inject 60 mg into the skin every 6 (six) months.  . escitalopram (LEXAPRO) 5 MG tablet Take 5 mg by mouth daily.  . furosemide (LASIX) 20 MG tablet Take 1 tablet (20 mg total) by mouth daily as needed.  Marland Kitchen ipratropium-albuterol (DUONEB) 0.5-2.5 (3) MG/3ML SOLN Take 3 mLs by nebulization every 6 (six) hours as needed (shortness of breath and cough).  Marland Kitchen lisinopril (ZESTRIL) 40  MG tablet Take 1 tablet (40 mg total) by mouth daily.  . Menaquinone-7 (VITAMIN K2 PO) Take 100 mcg by mouth daily.  . Omalizumab (XOLAIR [place]) Inject 225 mg into the skin every 3 (three) months.  Marland Kitchen omeprazole (PRILOSEC) 20 MG capsule Take 20 mg by mouth daily.    . simvastatin (ZOCOR) 20 MG tablet Take 20 mg by mouth at bedtime.    Marland Kitchen zinc gluconate 50 MG tablet Take 50 mg by mouth daily.   No facility-administered encounter medications on file as of 12/30/2021.    Past Medical History:  Diagnosis Date  . Allergic asthma   . Cataract   . GERD (gastroesophageal reflux disease)   . Hyperlipidemia   . Hypertension   . Osteoporosis     Past Surgical History:  Procedure Laterality Date  . CATARACT EXTRACTION, BILATERAL  2012    Family History  Problem Relation Age of Onset  . Heart attack Paternal Grandfather   . Colon cancer Neg Hx   . Esophageal cancer Neg Hx   . Rectal cancer Neg Hx   . Stomach cancer Neg Hx     Social History   Socioeconomic History  . Marital status: Widowed    Spouse name: Not on file  . Number of children: 3  .  Years of education: Not on file  . Highest education level: Not on file  Occupational History  . Occupation: Retired  Tobacco Use  . Smoking status: Former    Types: Cigarettes  . Smokeless tobacco: Never  . Tobacco comments:    States she did it socially  Vaping Use  . Vaping Use: Never used  Substance and Sexual Activity  . Alcohol use: Yes    Comment: margarita every month  . Drug use: No  . Sexual activity: Not on file  Other Topics Concern  . Not on file  Social History Narrative  . Not on file   Social Determinants of Health   Financial Resource Strain: Not on file  Food Insecurity: Not on file  Transportation Needs: Not on file  Physical Activity: Not on file  Stress: Not on file  Social Connections: Not on file  Intimate Partner Violence: Not on file    ROS      Objective    BP 116/60   Pulse 78   Temp  98.4 F (36.9 C) (Temporal)   Ht 5' (1.524 m)   Wt 163 lb (73.9 kg)   SpO2 95%   BMI 31.83 kg/m   Physical Exam  {Labs (Optional):23779}    Assessment & Plan:   Problem List Items Addressed This Visit   None   No follow-ups on file.   Gabriel Earing, FNP

## 2022-01-02 DIAGNOSIS — I509 Heart failure, unspecified: Secondary | ICD-10-CM | POA: Insufficient documentation

## 2022-01-04 ENCOUNTER — Encounter: Payer: Self-pay | Admitting: Internal Medicine

## 2022-01-10 ENCOUNTER — Encounter: Payer: Self-pay | Admitting: Internal Medicine

## 2022-01-10 NOTE — Assessment & Plan Note (Signed)
Bronchitis vs pneumonia Plan- CXR, Cefdinir (with allergy talk), prednisone taper, hydration, ED if worse  (note office lab closed today)

## 2022-01-10 NOTE — Assessment & Plan Note (Signed)
No obvious sinusitis symptoms described so far with current illness

## 2022-02-01 ENCOUNTER — Other Ambulatory Visit: Payer: Self-pay | Admitting: Family Medicine

## 2022-02-08 ENCOUNTER — Ambulatory Visit: Payer: Medicare PPO | Admitting: Nurse Practitioner

## 2022-02-08 ENCOUNTER — Encounter: Payer: Self-pay | Admitting: Nurse Practitioner

## 2022-02-08 VITALS — BP 152/68 | HR 70 | Ht 60.0 in | Wt 159.0 lb

## 2022-02-08 DIAGNOSIS — R195 Other fecal abnormalities: Secondary | ICD-10-CM

## 2022-02-08 MED ORDER — COLESTIPOL HCL 1 G PO TABS: 2.0000 g | ORAL_TABLET | Freq: Every day | ORAL | 3 refills | Status: DC

## 2022-02-08 NOTE — Patient Instructions (Signed)
Call my nurse Garen Lah in 3 weeks and let her know how the medication is working. Call us sooner if not tolerating the medication

## 2022-02-08 NOTE — Progress Notes (Signed)
Chief Complaint:  frequent diarrhea   Assessment &  Plan   # 87 yo female with chronic, loose stools which most often occur postprandially. She hasn't had any blood in stool, no associated weight loss and her weight is stable which are all good signs. Additionally, at least some of her bowel movements are normal. Suspect functional diarrhea though microscopic colitis is also possible.  Will cautiously try a low dose of colestid. Start with 2 mg daily. She will monitor for constipation and let us know if this occurs.  Call us in ~ 3 weeks with a condition update.   Hopefully things improve and we can avoid an invasive workup    # History of adenomatous colon polyps ( remote). No polyps / cancers on last colonoscopy in 2014, not recall due to age  HPI   Deborah Branch is a 87 y.o. female known to Dr.  Fuller Plan with a past medical history significant for HTN, HLD, chronic diastolic heart failure, and asthma. See PMH /PSH for additional history   Deborah Branch was last seen in 2021 for evaluation of bowel changes with urgent, loose stools.  Advised to try imodium as needed though it sounds like she didn't take it. . At that time > 50 % of her bowel movements were formed.    Patient was hospitalized in June 2023 for CAP and possible acute on chronic diastolic heart failure. Hgb noted to have declined from baseline of 13-14 to 11.8. Hgb spontaneously improved to 12.1 by time of discharge.   Interval History:  Patient presents with ongoing problems with frequent loose bowel movements, especially after she eats. Though this has been going on for years it has gotten worse over the last 1.5 years. She cannot think of any particular foods which are "safe" but does know that coffee, dairy and salads will definitely cause urgent loose stool .  She occasionally has a diet soda but otherwise doesn't use artifical sweeteners. No blood in stool. Bowel movements not associated with abdominal pain. No nocturnal  diarrhea.  She does have some solid, non-urgent BMs but generally this if she hasn't eaten. For the most part her bowel movements are loose. No blood in stool.  No weight loss.   Patient's son tells me that he had the exact same bowel issues. Dr. Havery Moros put him on Colestid and it has changed his life.    Previous GI Evaluation  Dec 2014 polyp surveillance colonoscopy  -Diverticulosis -No recall due to age  Labs:     Latest Ref Rng & Units 12/09/2021    3:25 AM 12/08/2021    3:55 AM 12/07/2021    8:23 PM  CBC  WBC 4.0 - 10.5 K/uL 12.5  16.9  15.9   Hemoglobin 12.0 - 15.0 g/dL 12.1  11.8  13.0   Hematocrit 36.0 - 46.0 % 37.3  36.2  40.3   Platelets 150 - 400 K/uL 266  244  278        Latest Ref Rng & Units 12/08/2021    3:55 AM 12/07/2021    8:23 PM  Hepatic Function  Total Protein 6.5 - 8.1 g/dL 6.2  7.4   Albumin 3.5 - 5.0 g/dL 3.3  4.0   AST 15 - 41 U/L 21  28   ALT 0 - 44 U/L 16  20   Alk Phosphatase 38 - 126 U/L 42  48   Total Bilirubin 0.3 - 1.2 mg/dL 0.5  0.4  Past Medical History:  Diagnosis Date   Allergic asthma    Asthma    Cataract    Congestive heart failure (CHF) ([place])    Eczema    GERD (gastroesophageal reflux disease)    Hyperlipidemia    Hypertension    Osteoporosis     Past Surgical History:  Procedure Laterality Date   CATARACT EXTRACTION, BILATERAL  2012    Current Medications, Allergies, Family History and Social History were reviewed in [employer] record.     Current Outpatient Medications  Medication Sig Dispense Refill   amLODipine (NORVASC) 5 MG tablet Take 1 tablet (5 mg total) by mouth daily. 90 tablet 3   budesonide-formoterol (SYMBICORT) 80-4.5 MCG/ACT inhaler INHALE TWO PUFFS INTO THE LUNGS TWICE DAILY 10.2 g 5   calcium carbonate (OSCAL) 1500 (600 Ca) MG TABS tablet Take 1 tablet by mouth 2 (two) times daily with a meal.     denosumab (PROLIA) 60 MG/ML SOSY injection Inject 60 mg into the skin every  6 (six) months.     escitalopram (LEXAPRO) 5 MG tablet Take 5 mg by mouth daily.     ipratropium-albuterol (DUONEB) 0.5-2.5 (3) MG/3ML SOLN Take 3 mLs by nebulization every 6 (six) hours as needed (shortness of breath and cough). 75 mL 2   lisinopril (ZESTRIL) 40 MG tablet Take 1 tablet (40 mg total) by mouth daily. 30 tablet 3   Menaquinone-7 (VITAMIN K2 PO) Take 100 mcg by mouth daily.     Omalizumab (XOLAIR [place]) Inject 225 mg into the skin every 3 (three) months.     omeprazole (PRILOSEC) 20 MG capsule TAKE ONE CAPSULE BY MOUTH DAILY 90 capsule 1   simvastatin (ZOCOR) 20 MG tablet Take 20 mg by mouth at bedtime.       zinc gluconate 50 MG tablet Take 50 mg by mouth daily.     No current facility-administered medications for this visit.    Review of Systems: No chest pain. No shortness of breath. No urinary complaints.    Physical Exam  Wt Readings from Last 3 Encounters:  12/30/21 163 lb (73.9 kg)  12/19/21 157 lb (71.2 kg)  12/16/21 161 lb (73 kg)    BP (!) 152/68   Pulse 70   Ht 5' (1.524 m)   Wt 159 lb (72.1 kg)   BMI 31.05 kg/m  Constitutional:  Generally well appearing female in no acute distress. Psychiatric: Pleasant. Normal mood and affect. Behavior is normal. EENT: Pupils normal.  Conjunctivae are normal. No scleral icterus. Neck supple.  Cardiovascular: Normal rate, regular rhythm. No edema Pulmonary/chest: Effort normal and breath sounds normal. No wheezing, rales or rhonchi. Abdominal: Soft, nondistended, nontender. Bowel sounds active throughout. There are no masses palpable. No hepatomegaly. Neurological: Alert and oriented to person place and time. Skin: Skin is warm and dry. No rashes noted.  Tye Savoy, NP  02/08/2022, 9:59 AM

## 2022-02-10 DIAGNOSIS — E559 Vitamin D deficiency, unspecified: Secondary | ICD-10-CM | POA: Diagnosis not present

## 2022-02-10 DIAGNOSIS — M810 Age-related osteoporosis without current pathological fracture: Secondary | ICD-10-CM | POA: Diagnosis not present

## 2022-02-10 DIAGNOSIS — R5383 Other fatigue: Secondary | ICD-10-CM | POA: Diagnosis not present

## 2022-03-02 ENCOUNTER — Telehealth: Payer: Self-pay

## 2022-03-02 NOTE — Telephone Encounter (Signed)
Pt states she took the entire prescription of colestid and it did not help with her loose stools. Last weekend pt took imodium and this seemed to work better. Pt reports she is currently not taking any medication for diarrhea and diarrhea has resolved.

## 2022-03-06 DIAGNOSIS — M810 Age-related osteoporosis without current pathological fracture: Secondary | ICD-10-CM | POA: Diagnosis not present

## 2022-03-06 DIAGNOSIS — E559 Vitamin D deficiency, unspecified: Secondary | ICD-10-CM | POA: Diagnosis not present

## 2022-03-10 ENCOUNTER — Ambulatory Visit: Payer: Medicare PPO

## 2022-03-21 ENCOUNTER — Ambulatory Visit (INDEPENDENT_AMBULATORY_CARE_PROVIDER_SITE_OTHER): Payer: Medicare PPO

## 2022-03-21 VITALS — BP 151/70 | HR 73 | Temp 98.3°F | Resp 20 | Ht 60.0 in | Wt 161.4 lb

## 2022-03-21 DIAGNOSIS — J4540 Moderate persistent asthma, uncomplicated: Secondary | ICD-10-CM

## 2022-03-21 MED ORDER — OMALIZUMAB 75 MG/0.5ML ~~LOC~~ SOSY
225.0000 mg | PREFILLED_SYRINGE | Freq: Once | SUBCUTANEOUS | Status: AC
  Administered 2022-03-21: 225 mg via SUBCUTANEOUS
  Filled 2022-03-21: qty 0.5

## 2022-03-21 NOTE — Progress Notes (Signed)
Diagnosis: Asthma  Provider:  Marshell Garfinkel MD  Procedure: Injection  Xolair (Omalizumab), Dose: 225 mg, Site: subcutaneous, Number of injections: 2  Discharge: Condition: Good, Destination: Home . AVS provided to patient.   Performed by:  Koren Shiver, RN

## 2022-04-03 ENCOUNTER — Encounter: Payer: Self-pay | Admitting: Family Medicine

## 2022-04-03 ENCOUNTER — Ambulatory Visit: Payer: Medicare PPO | Admitting: Family Medicine

## 2022-04-03 VITALS — BP 139/64 | HR 64 | Temp 98.2°F | Ht 60.0 in | Wt 160.4 lb

## 2022-04-03 DIAGNOSIS — R944 Abnormal results of kidney function studies: Secondary | ICD-10-CM

## 2022-04-03 DIAGNOSIS — I5032 Chronic diastolic (congestive) heart failure: Secondary | ICD-10-CM | POA: Diagnosis not present

## 2022-04-03 DIAGNOSIS — R609 Edema, unspecified: Secondary | ICD-10-CM

## 2022-04-03 DIAGNOSIS — J4540 Moderate persistent asthma, uncomplicated: Secondary | ICD-10-CM

## 2022-04-03 DIAGNOSIS — Z23 Encounter for immunization: Secondary | ICD-10-CM | POA: Diagnosis not present

## 2022-04-03 DIAGNOSIS — E782 Mixed hyperlipidemia: Secondary | ICD-10-CM | POA: Diagnosis not present

## 2022-04-03 DIAGNOSIS — R600 Localized edema: Secondary | ICD-10-CM

## 2022-04-03 DIAGNOSIS — I10 Essential (primary) hypertension: Secondary | ICD-10-CM | POA: Diagnosis not present

## 2022-04-03 MED ORDER — FUROSEMIDE 20 MG PO TABS: 20.0000 mg | ORAL_TABLET | Freq: Every day | ORAL | 0 refills | Status: DC

## 2022-04-03 NOTE — Patient Instructions (Signed)
Peripheral Edema  Peripheral edema is swelling that is caused by a buildup of fluid. Peripheral edema most often affects the lower legs, ankles, and feet. It can also develop in the arms, hands, and face. The area of the body that has peripheral edema will look swollen. It may also feel heavy or warm. Your clothes may start to feel tight. Pressing on the area may make a temporary dent in your skin (pitting edema). You may not be able to move your swollen arm or leg as much as usual. There are many causes of peripheral edema. It can happen because of a complication of other conditions such as heart failure, kidney disease, or a problem with your circulation. It also can be a side effect of certain medicines or happen because of an infection. It often happens to women during pregnancy. Sometimes, the cause is not known. Follow these instructions at home: Managing pain, stiffness, and swelling  Raise (elevate) your legs while you are sitting or lying down. Move around often to prevent stiffness and to reduce swelling. Do not sit or stand for long periods of time. Do not wear tight clothing. Do not wear garters on your upper legs. Exercise your legs to get your circulation going. This helps to move the fluid back into your blood vessels, and it may help the swelling go down. Wear compression stockings as told by your health care provider. These stockings help to prevent blood clots and reduce swelling in your legs. It is important that these are the correct size. These stockings should be prescribed by your doctor to prevent possible injuries. If elastic bandages or wraps are recommended, use them as told by your health care provider. Medicines Take over-the-counter and prescription medicines only as told by your health care provider. Your health care provider may prescribe medicine to help your body get rid of excess water (diuretic). Take this medicine if you are told to take it. General  instructions Eat a low-salt (low-sodium) diet as told by your health care provider. Sometimes, eating less salt may reduce swelling. Pay attention to any changes in your symptoms. Moisturize your skin daily to help prevent skin from cracking and draining. Keep all follow-up visits. This is important. Contact a health care provider if: You have a fever. You have swelling in only one leg. You have increased swelling, redness, or pain in one or both of your legs. You have drainage or sores at the area where you have edema. Get help right away if: You have edema that starts suddenly or is getting worse, especially if you are pregnant or have a medical condition. You develop shortness of breath, especially when you are lying down. You have pain in your chest or abdomen. You feel weak. You feel like you will faint. These symptoms may be an emergency. Get help right away. Call 911. Do not wait to see if the symptoms will go away. Do not drive yourself to the hospital. Summary Peripheral edema is swelling that is caused by a buildup of fluid. Peripheral edema most often affects the lower legs, ankles, and feet. Move around often to prevent stiffness and to reduce swelling. Do not sit or stand for long periods of time. Pay attention to any changes in your symptoms. Contact a health care provider if you have edema that starts suddenly or is getting worse, especially if you are pregnant or have a medical condition. Get help right away if you develop shortness of breath, especially when lying down.   This information is not intended to replace advice given to you by your health care provider. Make sure you discuss any questions you have with your health care provider. Document Revised: 02/14/2021 Document Reviewed: 02/14/2021 Elsevier Patient Education  2023 Elsevier Inc.  

## 2022-04-03 NOTE — Progress Notes (Unsigned)
   Established Patient Office Visit  Subjective   Patient ID: Deborah Branch, female    DOB: 09/22/1934  Age: 87 y.o. MRN: [id number]  Chief Complaint  Patient presents with   Medical Management of Chronic Issues   Hypertension   Gastroesophageal Reflux    HPI HTN Complaint with meds - Yes Current Medications - norvasc 5 mg  Checking BP at home ranging 120s/80s Exercising Regularly - No Pertinent ROS:  Headache - No Fatigue - No Visual Disturbances - No Chest pain - No Dyspnea - No Palpitations - No LE edema - reports mild swelling in her ankles bilaterally for the last few weeks. She has taken lasix occasionally without improvement. Improves with elevation.   BP Readings from Last 3 Encounters:  04/03/22 139/64  03/21/22 (!) 151/70  02/08/22 (!) 152/68      Latest Ref Rng & Units 12/09/2021    3:25 AM 12/08/2021    3:55 AM 12/07/2021    8:23 PM  CMP  Glucose 70 - 99 mg/dL 129  130  113   BUN 8 - 23 mg/dL 20  20  22    Creatinine 0.44 - 1.00 mg/dL 0.81  0.95  1.05   Sodium 135 - 145 mmol/L 139  140  138   Potassium 3.5 - 5.1 mmol/L 3.6  3.6  3.7   Chloride 98 - 111 mmol/L 108  109  106   CO2 22 - 32 mmol/L 24  23  23    Calcium 8.9 - 10.3 mg/dL 8.2  8.2  8.9   Total Protein 6.5 - 8.1 g/dL  6.2  7.4   Total Bilirubin 0.3 - 1.2 mg/dL  0.5  0.4   Alkaline Phos 38 - 126 U/L  42  48   AST 15 - 41 U/L  21  28   ALT 0 - 44 U/L  16  20        {History (Optional):23778}  ROS    Objective:     BP 139/64   Pulse 64   Temp 98.2 F (36.8 C) (Temporal)   Ht 5' (1.524 m)   Wt 160 lb 6 oz (72.7 kg)   SpO2 95%   BMI 31.32 kg/m  {Vitals History (Optional):23777}  Physical Exam   No results found for any visits on 04/03/22.  {Labs (Optional):23779}  The ASCVD Risk score (Arnett DK, et al., 2019) failed to calculate for the following reasons:   The 2019 ASCVD risk score is only valid for ages 28 to 94    Assessment & Plan:   Problem List Items Addressed This  Visit   None   No follow-ups on file.    Gwenlyn Perking, FNP

## 2022-04-10 ENCOUNTER — Encounter: Payer: Self-pay | Admitting: Family Medicine

## 2022-04-10 ENCOUNTER — Ambulatory Visit (INDEPENDENT_AMBULATORY_CARE_PROVIDER_SITE_OTHER): Payer: Medicare PPO | Admitting: Family Medicine

## 2022-04-10 VITALS — BP 130/61 | HR 71 | Temp 97.6°F | Ht 60.0 in | Wt 157.1 lb

## 2022-04-10 DIAGNOSIS — R609 Edema, unspecified: Secondary | ICD-10-CM

## 2022-04-10 DIAGNOSIS — R195 Other fecal abnormalities: Secondary | ICD-10-CM

## 2022-04-10 DIAGNOSIS — I10 Essential (primary) hypertension: Secondary | ICD-10-CM | POA: Diagnosis not present

## 2022-04-10 NOTE — Patient Instructions (Signed)
Peripheral Edema  Peripheral edema is swelling that is caused by a buildup of fluid. Peripheral edema most often affects the lower legs, ankles, and feet. It can also develop in the arms, hands, and face. The area of the body that has peripheral edema will look swollen. It may also feel heavy or warm. Your clothes may start to feel tight. Pressing on the area may make a temporary dent in your skin (pitting edema). You may not be able to move your swollen arm or leg as much as usual. There are many causes of peripheral edema. It can happen because of a complication of other conditions such as heart failure, kidney disease, or a problem with your circulation. It also can be a side effect of certain medicines or happen because of an infection. It often happens to women during pregnancy. Sometimes, the cause is not known. Follow these instructions at home: Managing pain, stiffness, and swelling  Raise (elevate) your legs while you are sitting or lying down. Move around often to prevent stiffness and to reduce swelling. Do not sit or stand for long periods of time. Do not wear tight clothing. Do not wear garters on your upper legs. Exercise your legs to get your circulation going. This helps to move the fluid back into your blood vessels, and it may help the swelling go down. Wear compression stockings as told by your health care provider. These stockings help to prevent blood clots and reduce swelling in your legs. It is important that these are the correct size. These stockings should be prescribed by your doctor to prevent possible injuries. If elastic bandages or wraps are recommended, use them as told by your health care provider. Medicines Take over-the-counter and prescription medicines only as told by your health care provider. Your health care provider may prescribe medicine to help your body get rid of excess water (diuretic). Take this medicine if you are told to take it. General  instructions Eat a low-salt (low-sodium) diet as told by your health care provider. Sometimes, eating less salt may reduce swelling. Pay attention to any changes in your symptoms. Moisturize your skin daily to help prevent skin from cracking and draining. Keep all follow-up visits. This is important. Contact a health care provider if: You have a fever. You have swelling in only one leg. You have increased swelling, redness, or pain in one or both of your legs. You have drainage or sores at the area where you have edema. Get help right away if: You have edema that starts suddenly or is getting worse, especially if you are pregnant or have a medical condition. You develop shortness of breath, especially when you are lying down. You have pain in your chest or abdomen. You feel weak. You feel like you will faint. These symptoms may be an emergency. Get help right away. Call 911. Do not wait to see if the symptoms will go away. Do not drive yourself to the hospital. Summary Peripheral edema is swelling that is caused by a buildup of fluid. Peripheral edema most often affects the lower legs, ankles, and feet. Move around often to prevent stiffness and to reduce swelling. Do not sit or stand for long periods of time. Pay attention to any changes in your symptoms. Contact a health care provider if you have edema that starts suddenly or is getting worse, especially if you are pregnant or have a medical condition. Get help right away if you develop shortness of breath, especially when lying down.   This information is not intended to replace advice given to you by your health care provider. Make sure you discuss any questions you have with your health care provider. Document Revised: 02/14/2021 Document Reviewed: 02/14/2021 Elsevier Patient Education  2023 Elsevier Inc.  

## 2022-04-10 NOTE — Progress Notes (Signed)
   Established Patient Office Visit  Subjective   Patient ID: Deborah Branch, female    DOB: 11-Oct-1934  Age: 87 y.o. MRN: [id number]  Chief Complaint  Patient presents with   GI Problem   Edema    GI Problem   Deborah Branch is here for follow up of peripheral edema. She started taking lasix 20 mg daily. She reports resolution of edema with this. [name] dizziness, chest pain, shortness of breath, or palpitations.   She also has been struggling with frequent loose stool. She reports that most of her stool are loose, especially if she eats. She denies pain. She does have urgency at times as well. Denies fever, chills, blood in stool, or weight loss. She was GI for this in August. She tried colestid without improvement. GI then suggest intermittent use of imodium. Deborah Branch reports that imodium has been somewhat helpful.     ROS As per HPI.    Objective:     BP 130/61   Pulse 71   Temp 97.6 F (36.4 C) (Temporal)   Ht 5' (1.524 m)   Wt 157 lb 2 oz (71.3 kg)   SpO2 96%   BMI 30.69 kg/m  Wt Readings from Last 3 Encounters:  04/10/22 157 lb 2 oz (71.3 kg)  04/03/22 160 lb 6 oz (72.7 kg)  03/21/22 161 lb 6.4 oz (73.2 kg)      Physical Exam Vitals and nursing note reviewed.  Constitutional:      General: She is not in acute distress.    Appearance: She is not ill-appearing, toxic-appearing or diaphoretic.  Cardiovascular:     Rate and Rhythm: Normal rate and regular rhythm.     Heart sounds: Normal heart sounds. No murmur heard. Pulmonary:     Effort: Pulmonary effort is normal.     Breath sounds: Normal breath sounds.  Abdominal:     General: Bowel sounds are normal. There is no distension.     Palpations: Abdomen is soft.     Tenderness: There is no abdominal tenderness. There is no guarding or rebound.  Musculoskeletal:     Right lower leg: No edema.     Left lower leg: No edema.  Skin:    General: Skin is warm.  Neurological:     General: No focal deficit present.     Mental  Status: She is alert and oriented to person, place, and time.  Psychiatric:        Mood and Affect: Mood normal.        Behavior: Behavior normal.      No results found for any visits on 04/10/22.    The ASCVD Risk score (Arnett DK, et al., 2019) failed to calculate for the following reasons:   The 2019 ASCVD risk score is only valid for ages 27 to 87    Assessment & Plan:   [name] was seen today for gi problem and edema.  Diagnoses and all orders for this visit:  Primary hypertension Peripheral edema Edema has resolved with daily lasix. Will recheck BMP today and plan to continue daily lasix. BP at goal today.  -     BMP8+EGFR  Loose stools Chronic. Managed by GI, no weight loss or pain. Currently taking imodium intermittently. She will follow up with GI regarding this.    Return in about 6 months (around 10/10/2022) for chronic follow up.   The patient indicates understanding of these issues and agrees with the plan.  Gwenlyn Perking, FNP

## 2022-04-11 LAB — BMP8+EGFR
BUN/Creatinine Ratio: 13 (ref 12–28)
BUN: 14 mg/dL (ref 8–27)
CO2: 21 mmol/L (ref 20–29)
Calcium: 9.4 mg/dL (ref 8.7–10.3)
Chloride: 102 mmol/L (ref 96–106)
Creatinine, Ser: 1.06 mg/dL — ABNORMAL HIGH (ref 0.57–1.00)
Glucose: 95 mg/dL (ref 70–99)
Potassium: 4.4 mmol/L (ref 3.5–5.2)
Sodium: 140 mmol/L (ref 134–144)
eGFR: 51 mL/min/{1.73_m2} — ABNORMAL LOW (ref >59)

## 2022-04-12 ENCOUNTER — Other Ambulatory Visit: Payer: Self-pay | Admitting: Family Medicine

## 2022-04-12 ENCOUNTER — Telehealth: Payer: Self-pay | Admitting: Family Medicine

## 2022-04-12 DIAGNOSIS — R609 Edema, unspecified: Secondary | ICD-10-CM

## 2022-04-12 MED ORDER — FUROSEMIDE 20 MG PO TABS: 20.0000 mg | ORAL_TABLET | Freq: Every day | ORAL | 3 refills | Status: DC

## 2022-04-19 ENCOUNTER — Other Ambulatory Visit: Payer: Self-pay | Admitting: *Deleted

## 2022-04-19 MED ORDER — LISINOPRIL 40 MG PO TABS: 40.0000 mg | ORAL_TABLET | Freq: Every day | ORAL | 1 refills | Status: DC

## 2022-04-20 NOTE — Telephone Encounter (Signed)
See result note, will close encounter. 

## 2022-05-09 ENCOUNTER — Other Ambulatory Visit: Payer: Self-pay | Admitting: *Deleted

## 2022-05-10 ENCOUNTER — Telehealth: Payer: Self-pay | Admitting: Family Medicine

## 2022-05-10 MED ORDER — SIMVASTATIN 20 MG PO TABS: 20.0000 mg | ORAL_TABLET | Freq: Every day | ORAL | 3 refills | Status: DC

## 2022-05-10 NOTE — Telephone Encounter (Signed)
  No answer unable to leave a message for patient to call back and schedule Medicare Annual Wellness Visit (AWV) to be completed by video or phone.  No hx of AWV eligible for AWVI per [employer] as of 06/26/2009  Please schedule at anytime with [facility] Nurse Health Advisor   45  Minutes appointment   Any questions, please call me at [phone]  

## 2022-05-30 NOTE — Progress Notes (Signed)
Cardiology Office Note:    Date:  06/05/2022   ID:  Deborah Branch, DOB Jun 14, 1935, MRN [id number]  PCP:  Gabriel Earing, FNP   [facility] Providers Cardiologist:  Previously Dr. Flora Lipps, but wants to follow with Dr. Diona Browner in [place]  Referring MD: Gabriel Earing, FNP   Chief Complaint  Patient presents with   Follow-up    HFpEF    History of Present Illness:    Deborah Branch is a 87 y.o. female with a hx of hypertension, asthma, and HFpEF.  She establish care with Dr. Flora Lipps 12/19/2021 following an echocardiogram that revealed preserved EF and mild diastolic dysfunction  She was admitted to [facility] with minimally elevated BNP and underwent diuresis.  Of note, CXR did not show edema or other signs of CHF.  Hypertension controlled with amlodipine and lisinopril and she takes as needed Lasix.  Her granddaughter works at the [employer] in the device clinic.  She presents today for 75-month follow-up. She is taking 20 mg lasix daily and doing well. She has no cardiac complaints today. She would like to follow up with Dr. Diona Browner in [place].    Past Medical History:  Diagnosis Date   Allergic asthma    Asthma    Cataract    Congestive heart failure (CHF) (HCC)    Eczema    GERD (gastroesophageal reflux disease)    Hyperlipidemia    Hypertension    Osteoporosis     Past Surgical History:  Procedure Laterality Date   CATARACT EXTRACTION, BILATERAL  2012    Current Medications: Current Meds  Medication Sig   amLODipine (NORVASC) 5 MG tablet Take 1 tablet (5 mg total) by mouth daily.   budesonide-formoterol (SYMBICORT) 80-4.5 MCG/ACT inhaler INHALE TWO PUFFS INTO THE LUNGS TWICE DAILY   calcium carbonate (OSCAL) 1500 (600 Ca) MG TABS tablet Take 1 tablet by mouth 2 (two) times daily with a meal.   denosumab (PROLIA) 60 MG/ML SOSY injection Inject 60 mg into the skin every 6 (six) months.   escitalopram (LEXAPRO) 5 MG tablet Take 5 mg by mouth  daily.   furosemide (LASIX) 20 MG tablet Take 1 tablet (20 mg total) by mouth daily. Keep on file   ipratropium-albuterol (DUONEB) 0.5-2.5 (3) MG/3ML SOLN Take 3 mLs by nebulization every 6 (six) hours as needed (shortness of breath and cough).   lisinopril (ZESTRIL) 40 MG tablet Take 1 tablet (40 mg total) by mouth daily.   Omalizumab (XOLAIR [place]) Inject 225 mg into the skin every 3 (three) months.   omeprazole (PRILOSEC) 20 MG capsule TAKE ONE CAPSULE BY MOUTH DAILY   simvastatin (ZOCOR) 20 MG tablet Take 1 tablet (20 mg total) by mouth at bedtime.   zinc gluconate 50 MG tablet Take 50 mg by mouth daily.     Allergies:   Clarithromycin and Penicillins   Social History   Socioeconomic History   Marital status: Widowed    Spouse name: Not on file   Number of children: 3   Years of education: Not on file   Highest education level: Not on file  Occupational History   Occupation: Retired  Tobacco Use   Smoking status: Former    Types: Cigarettes   Smokeless tobacco: Never   Tobacco comments:    States she did it socially  Building services engineer Use: Never used  Substance and Sexual Activity   Alcohol use: Yes    Comment: margarita every  month   Drug use: No   Sexual activity: Not on file  Other Topics Concern   Not on file  Social History Narrative   Not on file   Social Determinants of Health   Financial Resource Strain: Not on file  Food Insecurity: Not on file  Transportation Needs: Not on file  Physical Activity: Not on file  Stress: Not on file  Social Connections: Not on file     Family History: The patient's family history includes Heart attack in her paternal grandfather. There is no history of Colon cancer, Esophageal cancer, Rectal cancer, or Stomach cancer.  ROS:   Please see the history of present illness.     All other systems reviewed and are negative.  EKGs/Labs/Other Studies Reviewed:    The following studies were reviewed today:  Echo  12/08/21: 1. Left ventricular ejection fraction, by estimation, is 65 to 70%. Left  ventricular ejection fraction by 2D MOD biplane is 70.0 %. The left  ventricle has normal function. The left ventricle has no regional wall  motion abnormalities. Left ventricular  diastolic parameters are consistent with Grade I diastolic dysfunction  (impaired relaxation).   2. Right ventricular systolic function is normal. The right ventricular  size is normal. There is normal pulmonary artery systolic pressure. The  estimated right ventricular systolic pressure is 31.5 mmHg.   3. The mitral valve is abnormal. Trivial mitral valve regurgitation.   4. The aortic valve is tricuspid. Aortic valve regurgitation is not  visualized. Aortic valve sclerosis/calcification is present, without any  evidence of aortic stenosis. Aortic valve mean gradient measures 8.0 mmHg.   5. The inferior vena cava is normal in size with greater than 50%  respiratory variability, suggesting right atrial pressure of 3 mmHg.    EKG:  EKG is  ordered today.  The ekg ordered today demonstrates sinus rhythm HR 71  Recent Labs: 12/07/2021: B Natriuretic Peptide 196.0 12/08/2021: ALT 16; Magnesium 1.8 12/09/2021: Hemoglobin 12.1; Platelets 266 04/10/2022: BUN 14; Creatinine, Ser 1.06; Potassium 4.4; Sodium 140  Recent Lipid Panel No results found for: "CHOL", "TRIG", "HDL", "CHOLHDL", "VLDL", "LDLCALC", "LDLDIRECT"   Risk Assessment/Calculations:                Physical Exam:    VS:  BP 130/66   Pulse 71   Ht 5' (1.524 m)   Wt 160 lb (72.6 kg)   BMI 31.25 kg/m     Wt Readings from Last 3 Encounters:  06/05/22 160 lb (72.6 kg)  04/10/22 157 lb 2 oz (71.3 kg)  04/03/22 160 lb 6 oz (72.7 kg)     GEN:  Well nourished, well developed in no acute distress HEENT: Normal NECK: No JVD; No carotid bruits LYMPHATICS: No lymphadenopathy CARDIAC: RRR, no murmurs, rubs, gallops RESPIRATORY:  Clear to auscultation without  rales, wheezing or rhonchi  ABDOMEN: Soft, non-tender, non-distended MUSCULOSKELETAL:  No edema; No deformity  SKIN: Warm and dry NEUROLOGIC:  Alert and oriented x 3 PSYCHIATRIC:  Normal affect   ASSESSMENT:    1. Primary hypertension   2. Chronic heart failure with preserved ejection fraction (HCC)   3. Mixed hyperlipidemia    PLAN:    In order of problems listed above:  Diastolic dysfunction without heart failure Taking lasix daily - 20 mg BMP and K stable on this regimen   Hypertension 5 mg amlodipine, 40 mg lisinopril   Hyperlipidemia Maintained on 20 mg simvastatin LDL was 123 01/2021 - discussed trying to get  this under 100   Follow up in 1 year.       Medication Adjustments/Labs and Tests Ordered: Current medicines are reviewed at length with the patient today.  Concerns regarding medicines are outlined above.  Orders Placed This Encounter  Procedures   EKG 12-Lead   No orders of the defined types were placed in this encounter.   Patient Instructions  Medication Instructions:  No changes *If you need a refill on your cardiac medications before your next appointment, please call your pharmacy*   Lab Work: No Labs If you have labs (blood work) drawn today and your tests are completely normal, you will receive your results only by: MyChart Message (if you have MyChart) OR A paper copy in the mail If you have any lab test that is abnormal or we need to change your treatment, we will call you to review the results.   Testing/Procedures: No Testing   Follow-Up: At [facility], you and your health needs are our priority.  As part of our continuing mission to provide you with exceptional heart care, we have created designated Provider Care Teams.  These Care Teams include your primary Cardiologist (physician) and Advanced Practice Providers (APPs -  Physician Assistants and Nurse Practitioners) who all work together to provide you with the care  you need, when you need it.  We recommend signing up for the patient portal called "MyChart".  Sign up information is provided on this After Visit Summary.  MyChart is used to connect with patients for Virtual Visits (Telemedicine).  Patients are able to view lab/test results, encounter notes, upcoming appointments, etc.  Non-urgent messages can be sent to your provider as well.   To learn more about what you can do with MyChart, go to [email or web address].    Your next appointment:   1 year(s)  The format for your next appointment:   In Person  Provider:   Nona Dell, MD    Signed, Roe Rutherford Ricky Doan, [place]  06/05/2022 12:25 PM    [facility]

## 2022-06-05 ENCOUNTER — Ambulatory Visit: Payer: Medicare PPO | Admitting: Cardiovascular Disease

## 2022-06-05 ENCOUNTER — Encounter: Payer: Self-pay | Admitting: Physician Assistant

## 2022-06-05 ENCOUNTER — Ambulatory Visit: Payer: Medicare PPO | Attending: Cardiovascular Disease | Admitting: Physician Assistant

## 2022-06-05 VITALS — BP 130/66 | HR 71 | Ht 60.0 in | Wt 160.0 lb

## 2022-06-05 DIAGNOSIS — E782 Mixed hyperlipidemia: Secondary | ICD-10-CM | POA: Diagnosis not present

## 2022-06-05 DIAGNOSIS — I10 Essential (primary) hypertension: Secondary | ICD-10-CM

## 2022-06-05 DIAGNOSIS — I5032 Chronic diastolic (congestive) heart failure: Secondary | ICD-10-CM | POA: Diagnosis not present

## 2022-06-05 NOTE — Patient Instructions (Signed)
Medication Instructions:  No changes *If you need a refill on your cardiac medications before your next appointment, please call your pharmacy*   Lab Work: No Labs If you have labs (blood work) drawn today and your tests are completely normal, you will receive your results only by: MyChart Message (if you have MyChart) OR A paper copy in the mail If you have any lab test that is abnormal or we need to change your treatment, we will call you to review the results.   Testing/Procedures: No Testing   Follow-Up: At [facility], you and your health needs are our priority.  As part of our continuing mission to provide you with exceptional heart care, we have created designated Provider Care Teams.  These Care Teams include your primary Cardiologist (physician) and Advanced Practice Providers (APPs -  Physician Assistants and Nurse Practitioners) who all work together to provide you with the care you need, when you need it.  We recommend signing up for the patient portal called "MyChart".  Sign up information is provided on this After Visit Summary.  MyChart is used to connect with patients for Virtual Visits (Telemedicine).  Patients are able to view lab/test results, encounter notes, upcoming appointments, etc.  Non-urgent messages can be sent to your provider as well.   To learn more about what you can do with MyChart, go to [email or web address].    Your next appointment:   1 year(s)  The format for your next appointment:   In Person  Provider:   Nona Dell, MD

## 2022-06-22 ENCOUNTER — Ambulatory Visit (INDEPENDENT_AMBULATORY_CARE_PROVIDER_SITE_OTHER): Payer: Medicare PPO | Admitting: *Deleted

## 2022-06-22 VITALS — BP 147/73 | HR 65 | Temp 97.9°F | Resp 16 | Ht 60.0 in | Wt 159.2 lb

## 2022-06-22 DIAGNOSIS — J4540 Moderate persistent asthma, uncomplicated: Secondary | ICD-10-CM

## 2022-06-22 MED ORDER — OMALIZUMAB 75 MG/0.5ML ~~LOC~~ SOSY
225.0000 mg | PREFILLED_SYRINGE | Freq: Once | SUBCUTANEOUS | Status: AC
  Administered 2022-06-22: 225 mg via SUBCUTANEOUS
  Filled 2022-06-22: qty 0.5

## 2022-06-22 NOTE — Progress Notes (Signed)
Diagnosis: Asthma  Provider:  Chilton Greathouse MD  Procedure: Injection  Xolair (Omalizumab), Dose: 225 mg, Site: subcutaneous, Number of injections: 2  Post Care: Observation period completed  Discharge: Condition: Good, Destination: Home . AVS provided to patient.   Performed by:  Forrest Moron, RN

## 2022-07-06 ENCOUNTER — Telehealth: Payer: Self-pay | Admitting: Internal Medicine

## 2022-07-06 NOTE — Telephone Encounter (Signed)
Called and spoke with pt just to confirm the phone call we received from her pharmacy.  Pt said there is an issue with insurance and Symbicort and due to this is the reason for switching from Symbicort to Advair.  Dr. Annamaria Boots, please advise on which dose of Advair to send in for pt.

## 2022-07-06 NOTE — Telephone Encounter (Signed)
PT wants to change from Symbacourt to advair. Pls call Pharm to auth.   [phone]  [place].

## 2022-07-07 MED ORDER — FLUTICASONE-SALMETEROL 100-50 MCG/ACT IN AEPB: 1.0000 | INHALATION_SPRAY | Freq: Two times a day (BID) | RESPIRATORY_TRACT | 11 refills | Status: DC

## 2022-07-07 NOTE — Telephone Encounter (Signed)
Ok to Change from Symbicort 80 to Advair 100  1 puff theen rinse mouth, twice daily Refill x 1 year

## 2022-07-07 NOTE — Telephone Encounter (Signed)
Rx for Advair 100 has been sent to pharmacy for pt. Called and spoke with pt letting her know this had been done and she verbalized understanding. Nothing further needed.

## 2022-08-02 ENCOUNTER — Other Ambulatory Visit: Payer: Self-pay | Admitting: Pharmacy Technician

## 2022-08-04 ENCOUNTER — Other Ambulatory Visit: Payer: Self-pay | Admitting: Family Medicine

## 2022-08-14 ENCOUNTER — Telehealth: Payer: Self-pay | Admitting: Pharmacy Technician

## 2022-08-14 NOTE — Telephone Encounter (Signed)
Auth Submission: PENDING Payer: [employer] MEDICARE Medication & CPT/J Code(s) submitted: Xolair (Omalizumab) [id number] Route of submission (phone, fax, portal): CMM Phone # Fax # Auth type: Buy/Bill Units/visits requested:  Reference number: [id number] CASE ID: [id number] Approval from:  to

## 2022-08-15 ENCOUNTER — Encounter: Payer: Self-pay | Admitting: Internal Medicine

## 2022-08-15 DIAGNOSIS — R5383 Other fatigue: Secondary | ICD-10-CM | POA: Diagnosis not present

## 2022-08-15 DIAGNOSIS — E559 Vitamin D deficiency, unspecified: Secondary | ICD-10-CM | POA: Diagnosis not present

## 2022-08-15 DIAGNOSIS — M810 Age-related osteoporosis without current pathological fracture: Secondary | ICD-10-CM | POA: Diagnosis not present

## 2022-08-16 ENCOUNTER — Telehealth: Payer: Self-pay | Admitting: Family Medicine

## 2022-08-16 NOTE — Telephone Encounter (Signed)
Contacted Deborah Branch to schedule their annual wellness visit. Appointment made for 08/17/2022.  Thank you,  [name],  [place] [facility] Direct Dial ??[phone]

## 2022-08-17 ENCOUNTER — Ambulatory Visit (INDEPENDENT_AMBULATORY_CARE_PROVIDER_SITE_OTHER): Payer: Medicare PPO

## 2022-08-17 VITALS — Ht 60.0 in | Wt 159.0 lb

## 2022-08-17 DIAGNOSIS — Z780 Asymptomatic menopausal state: Secondary | ICD-10-CM

## 2022-08-17 DIAGNOSIS — Z0000 Encounter for general adult medical examination without abnormal findings: Secondary | ICD-10-CM

## 2022-08-17 NOTE — Progress Notes (Signed)
Subjective:   Deborah Branch is a 88 y.o. female who presents for Medicare Annual (Subsequent) preventive examination. I connected with  Burt Knack on 08/17/22 by a audio enabled telemedicine application and verified that I am speaking with the correct person using two identifiers.  Patient Location: Home  Provider Location: Home Office  I discussed the limitations of evaluation and management by telemedicine. The patient expressed understanding and agreed to proceed.  Review of Systems     Cardiac Risk Factors include: advanced age (>77mn, >>43women);hypertension     Objective:    Today's Vitals   08/17/22 1032  Weight: 159 lb (72.1 kg)  Height: 5' (1.524 m)   Body mass index is 31.05 kg/m.     08/17/2022   10:36 AM 12/08/2021    3:21 AM 02/01/2018   11:25 AM 08/26/2015    2:30 PM 04/27/2015    3:26 PM  Advanced Directives  Does Patient Have a Medical Advance Directive? Yes No Yes No No  Type of [place]  Living will    Does patient want to make changes to medical advance directive?   No - Patient declined    Copy of [place] Chart? No - copy requested      Would patient like information on creating a medical advance directive?  No - Patient declined No - Patient declined      Current Medications (verified) Outpatient Encounter Medications as of 08/17/2022  Medication Sig   amLODipine (NORVASC) 5 MG tablet Take 1 tablet (5 mg total) by mouth daily.   calcium carbonate (OSCAL) 1500 (600 Ca) MG TABS tablet Take 1 tablet by mouth 2 (two) times daily with a meal.   denosumab (PROLIA) 60 MG/ML SOSY injection Inject 60 mg into the skin every 6 (six) months.   escitalopram (LEXAPRO) 5 MG tablet Take 5 mg by mouth daily.   fluticasone-salmeterol (ADVAIR DISKUS) 100-50 MCG/ACT AEPB Inhale 1 puff into the lungs 2 (two) times daily.   furosemide (LASIX) 20 MG tablet Take 1 tablet (20 mg total) by mouth daily.  Keep on file   ipratropium-albuterol (DUONEB) 0.5-2.5 (3) MG/3ML SOLN Take 3 mLs by nebulization every 6 (six) hours as needed (shortness of breath and cough).   lisinopril (ZESTRIL) 40 MG tablet Take 1 tablet (40 mg total) by mouth daily.   Omalizumab (XOLAIR ) Inject 225 mg into the skin every 3 (three) months.   omeprazole (PRILOSEC) 20 MG capsule TAKE ONE CAPSULE BY MOUTH DAILY   simvastatin (ZOCOR) 20 MG tablet Take 1 tablet (20 mg total) by mouth at bedtime.   zinc gluconate 50 MG tablet Take 50 mg by mouth daily.   No facility-administered encounter medications on file as of 08/17/2022.    Allergies (verified) Clarithromycin and Penicillins   History: Past Medical History:  Diagnosis Date   Allergic asthma    Asthma    Cataract    Congestive heart failure (CHF) (HCC)    Eczema    GERD (gastroesophageal reflux disease)    Hyperlipidemia    Hypertension    Osteoporosis    Past Surgical History:  Procedure Laterality Date   CATARACT EXTRACTION, BILATERAL  2012   Family History  Problem Relation Age of Onset   Heart attack Paternal Grandfather    Colon cancer Neg Hx    Esophageal cancer Neg Hx    Rectal cancer Neg Hx    Stomach cancer Neg Hx  Social History   Socioeconomic History   Marital status: Widowed    Spouse name: Not on file   Number of children: 3   Years of education: Not on file   Highest education level: Not on file  Occupational History   Occupation: Retired  Tobacco Use   Smoking status: Former    Types: Cigarettes   Smokeless tobacco: Never   Tobacco comments:    States she did it socially  Scientific laboratory technician Use: Never used  Substance and Sexual Activity   Alcohol use: Yes    Comment: margarita every month   Drug use: No   Sexual activity: Not on file  Other Topics Concern   Not on file  Social History Narrative   Not on file   Social Determinants of Health   Financial Resource Strain: Low Risk  (08/17/2022)   Overall  Financial Resource Strain (CARDIA)    Difficulty of Paying Living Expenses: Not hard at all  Food Insecurity: No Food Insecurity (08/17/2022)   Hunger Vital Sign    Worried About Running Out of Food in the Last Year: Never true    [place] in the Last Year: Never true  Transportation Needs: No Transportation Needs (08/17/2022)   PRAPARE - Hydrologist (Medical): No    Lack of Transportation (Non-Medical): No  Physical Activity: Inactive (08/17/2022)   Exercise Vital Sign    Days of Exercise per Week: 0 days    Minutes of Exercise per Session: 0 min  Stress: No Stress Concern Present (08/17/2022)   [place]    Feeling of Stress : Not at all  Social Connections: Moderately Isolated (08/17/2022)   Social Connection and Isolation Panel [NHANES]    Frequency of Communication with Friends and Family: More than three times a week    Frequency of Social Gatherings with Friends and Family: More than three times a week    Attends Religious Services: More than 4 times per year    Active Member of [employer] or Organizations: No    Attends Archivist Meetings: Never    Marital Status: Widowed    Tobacco Counseling Counseling given: Not Answered Tobacco comments: States she did it socially   Clinical Intake:  Pre-visit preparation completed: Yes  Pain : No/denies pain     Nutritional Risks: None Diabetes: No  How often do you need to have someone help you when you read instructions, pamphlets, or other written materials from your doctor or pharmacy?: 1 - Never  Diabetic?no   Interpreter Needed?: No  Information entered by :: Jadene Pierini, LPN   Activities of Daily Living    08/17/2022   10:36 AM 12/08/2021    3:27 AM  In your present state of health, do you have any difficulty performing the following activities:  Hearing? 0   Vision? 0   Difficulty concentrating or  making decisions? 0   Walking or climbing stairs? 0   Dressing or bathing? 0   Doing errands, shopping? 0 0  Preparing Food and eating ? N   Using the Toilet? N   In the past six months, have you accidently leaked urine? N   Do you have problems with loss of bowel control? N   Managing your Medications? N   Managing your Finances? N   Housekeeping or managing your Housekeeping? N     Patient Care Team: Marjorie Smolder  M, FNP as PCP - General (Family Medicine)  Indicate any recent Medical Services you may have received from other than [facility] providers in the past year (date may be approximate).     Assessment:   This is a routine wellness examination for Deborah Branch.  Hearing/Vision screen Vision Screening - Comments:: Wears rx glasses - up to date with routine eye exams with  Dr.McQuen   Dietary issues and exercise activities discussed: Current Exercise Habits: The patient does not participate in regular exercise at present, Exercise limited by: None identified   Goals Addressed             This Visit's Progress    DIET - INCREASE WATER INTAKE         Depression Screen    08/17/2022   10:35 AM 04/10/2022   12:59 PM 04/03/2022    2:52 PM 12/30/2021    3:07 PM  PHQ 2/9 Scores  PHQ - 2 Score 0 0 0 0  PHQ- 9 Score  0 0 0    Fall Risk    08/17/2022   10:33 AM 04/10/2022   12:58 PM 04/03/2022    2:52 PM 12/30/2021    3:07 PM  Fall Risk   Falls in the past year? 0 0 0 0  Number falls in past yr: 0     Injury with Fall? 0     Risk for fall due to : No Fall Risks     Follow up Falls prevention discussed       [place]:  Any stairs in or around the home? Yes  If so, are there any without handrails? No  Home free of loose throw rugs in walkways, pet beds, electrical cords, etc? Yes  Adequate lighting in your home to reduce risk of falls? Yes   ASSISTIVE DEVICES UTILIZED TO PREVENT FALLS:  Life alert? No  Use of a cane, walker or w/c?  No  Grab bars in the bathroom? Yes  Shower chair or bench in shower? No  Elevated toilet seat or a handicapped toilet? No          08/17/2022   10:36 AM  6CIT Screen  What Year? 0 points  What month? 0 points  What time? 0 points  Count back from 20 0 points  Months in reverse 0 points  Repeat phrase 0 points  Total Score 0 points    Immunizations Immunization History  Administered Date(s) Administered   Fluad Quad(high Dose 65+) 03/19/2019, 03/18/2020, 03/10/2021, 04/03/2022   Influenza Split 03/13/2011, 03/12/2012   Influenza Whole 03/13/2008, 03/14/2010   Influenza, High Dose Seasonal PF 03/19/2017, 03/21/2018   Influenza,inj,Quad PF,6+ Mos 03/12/2013, 03/12/2014, 03/15/2015, 03/17/2016   PFIZER(Purple Top)SARS-COV-2 Vaccination 08/08/2019, 09/02/2019   Pneumococcal-Unspecified 12/24/2013   Rsv, Bivalent, Protein Subunit Rsvpref,pf Evans Lance) 04/11/2022    TDAP status: Due, Education has been provided regarding the importance of this vaccine. Advised may receive this vaccine at local pharmacy or Health Dept. Aware to provide a copy of the vaccination record if obtained from local pharmacy or Health Dept. Verbalized acceptance and understanding.  Flu Vaccine status: Up to date  Pneumococcal vaccine status: Due, Education has been provided regarding the importance of this vaccine. Advised may receive this vaccine at local pharmacy or Health Dept. Aware to provide a copy of the vaccination record if obtained from local pharmacy or Health Dept. Verbalized acceptance and understanding.  Covid-19 vaccine status: Completed vaccines  Qualifies for Shingles Vaccine? Yes  Zostavax completed No   Shingrix Completed?: No.    Education has been provided regarding the importance of this vaccine. Patient has been advised to call insurance company to determine out of pocket expense if they have not yet received this vaccine. Advised may also receive vaccine at local pharmacy or Health  Dept. Verbalized acceptance and understanding.  Screening Tests Health Maintenance  Topic Date Due   DTaP/Tdap/Td (1 - Tdap) Never done   COVID-19 Vaccine (3 - 2023-24 season) 02/24/2022   Pneumonia Vaccine 41+ Years old (1 of 1 - PCV) 12/31/2022 (Originally 10/22/2001)   Zoster Vaccines- Shingrix (1 of 2) 04/02/2023 (Originally 10/23/1986)   Medicare Annual Wellness (AWV)  08/18/2023   INFLUENZA VACCINE  Completed   DEXA SCAN  Completed   HPV VACCINES  Aged Out    Health Maintenance  Health Maintenance Due  Topic Date Due   DTaP/Tdap/Td (1 - Tdap) Never done   COVID-19 Vaccine (3 - 2023-24 season) 02/24/2022    Colorectal cancer screening: No longer required.   Mammogram status: No longer required due to age .  Bone Density status: Ordered 08/17/2022. Pt provided with contact info and advised to call to schedule appt.  Lung Cancer Screening: (Low Dose CT Chest recommended if Age 8-80 years, 30 pack-year currently smoking OR have quit w/in 15years.) does not qualify.   Lung Cancer Screening Referral: n/a  Additional Screening:  Hepatitis C Screening: does not qualify;   Vision Screening: Recommended annual ophthalmology exams for early detection of glaucoma and other disorders of the eye. Is the patient up to date with their annual eye exam?  Yes  Who is the provider or what is the name of the office in which the patient attends annual eye exams? Dr.MCQuen  If pt is not established with a provider, would they like to be referred to a provider to establish care? No .   Dental Screening: Recommended annual dental exams for proper oral hygiene  Community Resource Referral / Chronic Care Management: CRR required this visit?  No   CCM required this visit?  No      Plan:     I have personally reviewed and noted the following in the patient's chart:   Medical and social history Use of alcohol, tobacco or illicit drugs  Current medications and supplements including  opioid prescriptions. Patient is not currently taking opioid prescriptions. Functional ability and status Nutritional status Physical activity Advanced directives List of other physicians Hospitalizations, surgeries, and ER visits in previous 12 months Vitals Screenings to include cognitive, depression, and falls Referrals and appointments  In addition, I have reviewed and discussed with patient certain preventive protocols, quality metrics, and best practice recommendations. A written personalized care plan for preventive services as well as general preventive health recommendations were provided to patient.     Daphane Shepherd, LPN   [id number]   Nurse Notes: Due TDAP Everlean Alstrom Vaccine

## 2022-08-17 NOTE — Patient Instructions (Signed)
Deborah Branch , Thank you for taking time to come for your Medicare Wellness Visit. I appreciate your ongoing commitment to your health goals. Please review the following plan we discussed and let me know if I can assist you in the future.   These are the goals we discussed:  Goals      DIET - INCREASE WATER INTAKE        This is a list of the screening recommended for you and due dates:  Health Maintenance  Topic Date Due   DTaP/Tdap/Td vaccine (1 - Tdap) Never done   COVID-19 Vaccine (3 - 2023-24 season) 02/24/2022   Pneumonia Vaccine (1 of 1 - PCV) 12/31/2022*   Zoster (Shingles) Vaccine (1 of 2) 04/02/2023*   Medicare Annual Wellness Visit  08/18/2023   Flu Shot  Completed   DEXA scan (bone density measurement)  Completed   HPV Vaccine  Aged Out  *Topic was postponed. The date shown is not the original due date.    Advanced directives: Please bring a copy of your health care power of attorney and living will to the office to be added to your chart at your convenience.   Conditions/risks identified: Aim for 30 minutes of exercise or brisk walking, 6-8 glasses of water, and 5 servings of fruits and vegetables each day.   Next appointment: Follow up in one year for your annual wellness visit    Preventive Care 65 Years and Older, Female Preventive care refers to lifestyle choices and visits with your health care provider that can promote health and wellness. What does preventive care include? A yearly physical exam. This is also called an annual well check. Dental exams once or twice a year. Routine eye exams. Ask your health care provider how often you should have your eyes checked. Personal lifestyle choices, including: Daily care of your teeth and gums. Regular physical activity. Eating a healthy diet. Avoiding tobacco and drug use. Limiting alcohol use. Practicing safe sex. Taking low-dose aspirin every day. Taking vitamin and mineral supplements as recommended by your  health care provider. What happens during an annual well check? The services and screenings done by your health care provider during your annual well check will depend on your age, overall health, lifestyle risk factors, and family history of disease. Counseling  Your health care provider may ask you questions about your: Alcohol use. Tobacco use. Drug use. Emotional well-being. Home and relationship well-being. Sexual activity. Eating habits. History of falls. Memory and ability to understand (cognition). Work and work Statistician. Reproductive health. Screening  You may have the following tests or measurements: Height, weight, and BMI. Blood pressure. Lipid and cholesterol levels. These may be checked every 5 years, or more frequently if you are over 38 years old. Skin check. Lung cancer screening. You may have this screening every year starting at age 35 if you have a 30-pack-year history of smoking and currently smoke or have quit within the past 15 years. Fecal occult blood test (FOBT) of the stool. You may have this test every year starting at age 47. Flexible sigmoidoscopy or colonoscopy. You may have a sigmoidoscopy every 5 years or a colonoscopy every 10 years starting at age 74. Hepatitis C blood test. Hepatitis B blood test. Sexually transmitted disease (STD) testing. Diabetes screening. This is done by checking your blood sugar (glucose) after you have not eaten for a while (fasting). You may have this done every 1-3 years. Bone density scan. This is done to screen for  osteoporosis. You may have this done starting at age 31. Mammogram. This may be done every 1-2 years. Talk to your health care provider about how often you should have regular mammograms. Talk with your health care provider about your test results, treatment options, and if necessary, the need for more tests. Vaccines  Your health care provider may recommend certain vaccines, such as: Influenza vaccine.  This is recommended every year. Tetanus, diphtheria, and acellular pertussis (Tdap, Td) vaccine. You may need a Td booster every 10 years. Zoster vaccine. You may need this after age 82. Pneumococcal 13-valent conjugate (PCV13) vaccine. One dose is recommended after age 69. Pneumococcal polysaccharide (PPSV23) vaccine. One dose is recommended after age 72. Talk to your health care provider about which screenings and vaccines you need and how often you need them. This information is not intended to replace advice given to you by your health care provider. Make sure you discuss any questions you have with your health care provider. Document Released: 07/09/2015 Document Revised: 03/01/2016 Document Reviewed: 04/13/2015 Elsevier Interactive Patient Education  2017 [place] Prevention in the Home Falls can cause injuries. They can happen to people of all ages. There are many things you can do to make your home safe and to help prevent falls. What can I do on the outside of my home? Regularly fix the edges of walkways and driveways and fix any cracks. Remove anything that might make you trip as you walk through a door, such as a raised step or threshold. Trim any bushes or trees on the path to your home. Use bright outdoor lighting. Clear any walking paths of anything that might make someone trip, such as rocks or tools. Regularly check to see if handrails are loose or broken. Make sure that both sides of any steps have handrails. Any raised decks and porches should have guardrails on the edges. Have any leaves, snow, or ice cleared regularly. Use sand or salt on walking paths during winter. Clean up any spills in your garage right away. This includes oil or grease spills. What can I do in the bathroom? Use night lights. Install grab bars by the toilet and in the tub and shower. Do not use towel bars as grab bars. Use non-skid mats or decals in the tub or shower. If you need to sit  down in the shower, use a plastic, non-slip stool. Keep the floor dry. Clean up any water that spills on the floor as soon as it happens. Remove soap buildup in the tub or shower regularly. Attach bath mats securely with double-sided non-slip rug tape. Do not have throw rugs and other things on the floor that can make you trip. What can I do in the bedroom? Use night lights. Make sure that you have a light by your bed that is easy to reach. Do not use any sheets or blankets that are too big for your bed. They should not hang down onto the floor. Have a firm chair that has side arms. You can use this for support while you get dressed. Do not have throw rugs and other things on the floor that can make you trip. What can I do in the kitchen? Clean up any spills right away. Avoid walking on wet floors. Keep items that you use a lot in easy-to-reach places. If you need to reach something above you, use a strong step stool that has a grab bar. Keep electrical cords out of the way. Do not  use floor polish or wax that makes floors slippery. If you must use wax, use non-skid floor wax. Do not have throw rugs and other things on the floor that can make you trip. What can I do with my stairs? Do not leave any items on the stairs. Make sure that there are handrails on both sides of the stairs and use them. Fix handrails that are broken or loose. Make sure that handrails are as long as the stairways. Check any carpeting to make sure that it is firmly attached to the stairs. Fix any carpet that is loose or worn. Avoid having throw rugs at the top or bottom of the stairs. If you do have throw rugs, attach them to the floor with carpet tape. Make sure that you have a light switch at the top of the stairs and the bottom of the stairs. If you do not have them, ask someone to add them for you. What else can I do to help prevent falls? Wear shoes that: Do not have high heels. Have rubber bottoms. Are  comfortable and fit you well. Are closed at the toe. Do not wear sandals. If you use a stepladder: Make sure that it is fully opened. Do not climb a closed stepladder. Make sure that both sides of the stepladder are locked into place. Ask someone to hold it for you, if possible. Clearly mark and make sure that you can see: Any grab bars or handrails. First and last steps. Where the edge of each step is. Use tools that help you move around (mobility aids) if they are needed. These include: Canes. Walkers. Scooters. Crutches. Turn on the lights when you go into a dark area. Replace any light bulbs as soon as they burn out. Set up your furniture so you have a clear path. Avoid moving your furniture around. If any of your floors are uneven, fix them. If there are any pets around you, be aware of where they are. Review your medicines with your doctor. Some medicines can make you feel dizzy. This can increase your chance of falling. Ask your doctor what other things that you can do to help prevent falls. This information is not intended to replace advice given to you by your health care provider. Make sure you discuss any questions you have with your health care provider. Document Released: 04/08/2009 Document Revised: 11/18/2015 Document Reviewed: 07/17/2014 Elsevier Interactive Patient Education  2017 [employer].

## 2022-08-23 DIAGNOSIS — Z961 Presence of intraocular lens: Secondary | ICD-10-CM | POA: Diagnosis not present

## 2022-08-23 DIAGNOSIS — H52203 Unspecified astigmatism, bilateral: Secondary | ICD-10-CM | POA: Diagnosis not present

## 2022-08-23 DIAGNOSIS — H353131 Nonexudative age-related macular degeneration, bilateral, early dry stage: Secondary | ICD-10-CM | POA: Diagnosis not present

## 2022-08-23 DIAGNOSIS — H40013 Open angle with borderline findings, low risk, bilateral: Secondary | ICD-10-CM | POA: Diagnosis not present

## 2022-09-07 NOTE — Progress Notes (Signed)
Patient ID: Deborah Branch, female    DOB: 09-15-1934, 88 y.o.   MRN: [id number]  HPI  female never smoker followed for chronic asthma (Xolair), allergic rhinosinusitis, complicated by GERD Office Spirometry 08/26/15-moderate obstruction-FVC 1.7/76%, FEV1 1.1/67%, ratio 0.66, FEF 25-75% 0.6/41% --------------------------------------------------------------------------------------------  12/05/21- 88 year old female never smoker followed for Chronic Asthma (Xolair), allergic rhinosinusitis, complicated by GERD, Osteoporosis,  -Symbicort 80,  Proair hfa/ Ventolin, neb Duoneb,  Xolair(q 3 months) Covid vax- 3 Phizer NP visit TP 09/07/21- increased allergic rhintis> Mycelex troches for thrush, Claritin -States increased sob, productive cough, yellow/green sputum.  Sick since we sent doxycycline on June 1. Temp today 100 deg. Bronchitis now x since first of month. Some chills. Sleeping in recliner. Deenies blood, pain, GI, dysuria.  09/08/22- 88 year old female never smoker followed for Chronic Asthma (Xolair), allergic rhinosinusitis, complicated by GERD, Osteoporosis,  -Advair 100,  Proair hfa/ Ventolin, neb Duoneb,  Xolair(q 3 months) Covid vax- 3 Phizer Flu vax- had -----Pt states she has been clearing her throat over the last couple of months she advises the phlegm is clear-yellowish. Denies any coughing, sob or wheezing Throat clearing is variable but she is not aware of active reflux or heartburn and feels stable.  No significant changes in overall health or acute events.  Spring tree pollen season has started and she is trying to stay indoors to avoid this.  She continues to think Xolair is helpful, now getting injection every 3 months. Agrees to update chest x-ray. CXR 12/07/21- IMPRESSION: 1. Chronic appearing increased interstitial lung markings. 2. Mild left basilar atelectasis and/or infiltrate.    Review of Systems- see HPI  +  = positive Constitutional:   No-   weight loss, night  sweats, fevers, chills, fatigue, lassitude. HEENT:   No-  headaches, difficulty swallowing, tooth/dental problems, +sore throat,       No-  sneezing, itching, ear ache, +nasal congestion, + post nasal drip,  CV:  No-   chest pain, orthopnea, PND, +swelling in lower extremities, anasarca, dizziness, palpitations Resp: no-shortness of breath with exertion or at rest.              +  productive cough,   +non-productive cough,  No-  coughing up of blood.             +change in color of mucus.     Skin: No-   rash or lesions. GI:  No-   heartburn, indigestion, abdominal pain, nausea, vomiting,  GU: MS:  No-   joint pain or swelling. Neuro- grossly normal  Psych:  No- change in mood or affect. No depression or anxiety.  No memory loss.     Objective:   Physical Exam General- Alert, Oriented, Affect-appropriate, Distress- none acute  Medium build Skin- rash-none, lesions- none, excoriation- none Lymphadenopathy- none Head- atraumatic            Eyes- Gross vision intact, PERRLA, conjunctivae clear secretions            Ears- Hearing, canals normal            Nose- No sniffing, no-Septal dev, mucus, polyps, erosion, perforation             Throat- Mallampati III-IV , mucosa clear- not red , drainage- none, tonsils- atrophic Neck- flexible , trachea midline, no stridor , thyroid nl, carotid no bruit Chest - symmetrical excursion , unlabored           Heart/CV- RRR , + murmur 1/6 syst ,  no gallop  , no rub, nl s1 s2                           - JVD- none , edema+trace, stasis changes- none, varices- none           Lung-  Wheeze- none , dullness-none, rub- none, cough-none,            Chest wall-  Abd-  Br/ Gen/ Rectal- Not done, not indicated Extrem- cyanosis- none, clubbing, none, atrophy- none, strength- nl Neuro- grossly intact to observation

## 2022-09-08 ENCOUNTER — Ambulatory Visit: Payer: Medicare PPO | Admitting: Internal Medicine

## 2022-09-08 ENCOUNTER — Encounter: Payer: Self-pay | Admitting: Internal Medicine

## 2022-09-08 ENCOUNTER — Ambulatory Visit (INDEPENDENT_AMBULATORY_CARE_PROVIDER_SITE_OTHER): Payer: Medicare PPO

## 2022-09-08 VITALS — BP 132/70 | HR 79 | Ht 60.0 in | Wt 160.4 lb

## 2022-09-08 DIAGNOSIS — K219 Gastro-esophageal reflux disease without esophagitis: Secondary | ICD-10-CM

## 2022-09-08 DIAGNOSIS — J3089 Other allergic rhinitis: Secondary | ICD-10-CM

## 2022-09-08 DIAGNOSIS — J4540 Moderate persistent asthma, uncomplicated: Secondary | ICD-10-CM

## 2022-09-08 DIAGNOSIS — J4531 Mild persistent asthma with (acute) exacerbation: Secondary | ICD-10-CM | POA: Diagnosis not present

## 2022-09-08 DIAGNOSIS — J9809 Other diseases of bronchus, not elsewhere classified: Secondary | ICD-10-CM | POA: Diagnosis not present

## 2022-09-08 DIAGNOSIS — J302 Other seasonal allergic rhinitis: Secondary | ICD-10-CM

## 2022-09-08 NOTE — Assessment & Plan Note (Signed)
Continue reflux precautions.  Continue to watch for this as potential contributor to throat clearing, which may be partly habitual by now.

## 2022-09-08 NOTE — Assessment & Plan Note (Signed)
She is trying to avoid pollen exposure currently.

## 2022-09-08 NOTE — Patient Instructions (Signed)
We can continue current meds and Xolair  Order- CXR   dx asthmatic Bronchitis  moderate persistent  Please call if wee can help

## 2022-09-08 NOTE — Assessment & Plan Note (Signed)
She remains controlled with Xolair.  Current meds are sufficient as reviewed. Plan-continue Xolair

## 2022-09-14 ENCOUNTER — Ambulatory Visit (INDEPENDENT_AMBULATORY_CARE_PROVIDER_SITE_OTHER): Payer: Medicare PPO

## 2022-09-14 VITALS — BP 173/68 | HR 79 | Temp 98.3°F | Resp 16 | Ht 60.0 in | Wt 160.4 lb

## 2022-09-14 DIAGNOSIS — J4540 Moderate persistent asthma, uncomplicated: Secondary | ICD-10-CM | POA: Diagnosis not present

## 2022-09-14 MED ORDER — OMALIZUMAB 75 MG/0.5ML ~~LOC~~ SOSY
225.0000 mg | PREFILLED_SYRINGE | Freq: Once | SUBCUTANEOUS | Status: AC
  Administered 2022-09-14: 225 mg via SUBCUTANEOUS

## 2022-09-14 NOTE — Progress Notes (Signed)
Diagnosis: Asthma  Provider:  Marshell Garfinkel MD  Procedure: Injection  Xolair (Omalizumab), Dose: 225 mg, Site: subcutaneous, Number of injections: 2  150 mg administered in right arm. 75 mg administered in left arm.  Post Care: Patient declined observation  Discharge: Condition: Good, Destination: Home . AVS Provided  Performed by:  Binnie Kand, RN

## 2022-09-18 ENCOUNTER — Ambulatory Visit: Payer: Medicare PPO | Admitting: Family Medicine

## 2022-09-18 ENCOUNTER — Ambulatory Visit: Payer: Medicare PPO

## 2022-09-18 ENCOUNTER — Encounter: Payer: Self-pay | Admitting: Family Medicine

## 2022-09-18 ENCOUNTER — Other Ambulatory Visit: Payer: Self-pay | Admitting: Family Medicine

## 2022-09-18 VITALS — BP 130/66 | HR 74 | Temp 97.8°F | Ht 60.0 in | Wt 161.0 lb

## 2022-09-18 DIAGNOSIS — J4540 Moderate persistent asthma, uncomplicated: Secondary | ICD-10-CM

## 2022-09-18 DIAGNOSIS — I10 Essential (primary) hypertension: Secondary | ICD-10-CM

## 2022-09-18 DIAGNOSIS — I5032 Chronic diastolic (congestive) heart failure: Secondary | ICD-10-CM | POA: Diagnosis not present

## 2022-09-18 DIAGNOSIS — K219 Gastro-esophageal reflux disease without esophagitis: Secondary | ICD-10-CM | POA: Diagnosis not present

## 2022-09-18 DIAGNOSIS — E782 Mixed hyperlipidemia: Secondary | ICD-10-CM | POA: Diagnosis not present

## 2022-09-18 DIAGNOSIS — Z780 Asymptomatic menopausal state: Secondary | ICD-10-CM

## 2022-09-18 DIAGNOSIS — I110 Hypertensive heart disease with heart failure: Secondary | ICD-10-CM

## 2022-09-18 DIAGNOSIS — M810 Age-related osteoporosis without current pathological fracture: Secondary | ICD-10-CM | POA: Diagnosis not present

## 2022-09-18 NOTE — Patient Instructions (Addendum)
Melatonin supplement 1-5 mg nightly for sleep.   Start a daily over the counter non-drowsy antihistamine through pollen season. (Claritin, zyrtec, xzyal, or allegra)

## 2022-09-18 NOTE — Progress Notes (Signed)
Established Patient Office Visit  Subjective   Patient ID: Deborah Branch, female    DOB: March 06, 1935  Age: 88 y.o. MRN: [id number]  Chief Complaint  Patient presents with   Medical Management of Chronic Issues   Hypertension   Hyperlipidemia    HPI  HTN Complaint with meds - Yes Current Medications - amlodipine, lisinopril Pertinent ROS:  Headache - No Fatigue - No Visual Disturbances - No Chest pain - No Dyspnea - No Palpitations - No LE edema - No  Sees cardiology once a year.   2. HLD On statin. She is active. Fairly well balanced diet.   3. Asthma/allergies Sees pulmonology. She uses advair daily. Xolair every 3 months. She has been having some congestion and facial pressure for the last few days. Denies fever, shortness of breath, wheezing.    4. Osteoporosis On prolia and os-cal supplement. Managed by ortho. Percell Miller and Para March). She recently had labs done for this.   5. GERD Compliant with medications - Yes Current medications - prilosec 20 mg  Cough - No Sore throat - No Voice change - No Hemoptysis - No Dysphagia or dyspepsia - No Water brash - No Red Flags (weight loss, hematochezia, melena, weight loss, early satiety, fevers, odynophagia, or persistent vomiting) - No   6. Trouble falling asleep She reads before bed until she is sleepy. It then takes her about 30-90 minutes to fall asleep. She sleep wells once she is asleep. She is getting about 7 hours of sleep a night. She has not tied other remedies.   Past Medical History:  Diagnosis Date   Allergic asthma    Asthma    Cataract    Congestive heart failure (CHF) (HCC)    Eczema    GERD (gastroesophageal reflux disease)    Hyperlipidemia    Hypertension    Osteoporosis       ROS As per HPI.     Objective:     BP 130/66   Pulse 74   Temp 97.8 F (36.6 C) (Temporal)   Ht 5' (1.524 m)   Wt 161 lb (73 kg)   SpO2 95%   BMI 31.44 kg/m    Physical Exam Vitals and nursing note  reviewed.  Constitutional:      General: She is not in acute distress.    Appearance: She is not ill-appearing, toxic-appearing or diaphoretic.  Cardiovascular:     Rate and Rhythm: Normal rate and regular rhythm.     Heart sounds: Normal heart sounds. No murmur heard. Pulmonary:     Effort: Pulmonary effort is normal. No respiratory distress.     Breath sounds: Normal breath sounds. No wheezing, rhonchi or rales.  Abdominal:     General: Bowel sounds are normal. There is no distension.     Palpations: Abdomen is soft.     Tenderness: There is no abdominal tenderness. There is no guarding or rebound.  Musculoskeletal:     Cervical back: Neck supple. No rigidity.     Right lower leg: No edema.     Left lower leg: No edema.  Skin:    General: Skin is warm and dry.  Neurological:     General: No focal deficit present.     Mental Status: She is alert and oriented to person, place, and time.  Psychiatric:        Mood and Affect: Mood normal.        Behavior: Behavior normal.  Thought Content: Thought content normal.        Judgment: Judgment normal.      No results found for any visits on 09/18/22.    The ASCVD Risk score (Arnett DK, et al., 2019) failed to calculate for the following reasons:   The 2019 ASCVD risk score is only valid for ages 59 to 17    Assessment & Plan:   Deborah Branch was seen today for medical management of chronic issues, hypertension and hyperlipidemia.  Diagnoses and all orders for this visit:  Primary hypertension Well controlled on current regimen. Continue lasix, amlodipine, and lisinopril. Reviewed labs from 08/15/22 in [facility] portal. Normal CBC and CMP.   Chronic diastolic heart failure (HCC) Stable. Continue follow up with cardiology.   Mixed hyperlipidemia On statin. She is not fasting today. Will check fasting panel with next labs.   Moderate persistent asthma without complication Managed by pulmonology. Stable symptoms.    Gastroesophageal reflux disease without esophagitis Well controlled on current regimen. On prilosec.   Age related osteoporosis, unspecified pathological fracture presence On prolia, os-cal. Managed by ortho. Vitamin D level of 76 on 08/15/22. Discussed can have need labs down at office next time.    Return in about 6 months (around 03/21/2023) for CPE with fasting labs.   The patient indicates understanding of these issues and agrees with the plan.  Deborah Perking, FNP

## 2022-09-19 ENCOUNTER — Telehealth: Payer: Self-pay | Admitting: Family Medicine

## 2022-09-19 ENCOUNTER — Other Ambulatory Visit: Payer: Medicare PPO

## 2022-09-20 NOTE — Telephone Encounter (Signed)
Called patient and reschedule for tomorrow 09/21/22

## 2022-09-21 ENCOUNTER — Ambulatory Visit (INDEPENDENT_AMBULATORY_CARE_PROVIDER_SITE_OTHER): Payer: Medicare PPO

## 2022-09-21 DIAGNOSIS — Z780 Asymptomatic menopausal state: Secondary | ICD-10-CM

## 2022-09-21 DIAGNOSIS — M8589 Other specified disorders of bone density and structure, multiple sites: Secondary | ICD-10-CM | POA: Diagnosis not present

## 2022-09-22 ENCOUNTER — Other Ambulatory Visit: Payer: Medicare PPO

## 2022-10-02 DIAGNOSIS — M810 Age-related osteoporosis without current pathological fracture: Secondary | ICD-10-CM | POA: Diagnosis not present

## 2022-10-11 ENCOUNTER — Other Ambulatory Visit: Payer: Medicare PPO

## 2022-10-11 ENCOUNTER — Ambulatory Visit: Payer: Medicare PPO | Admitting: Family Medicine

## 2022-10-16 ENCOUNTER — Other Ambulatory Visit: Payer: Self-pay | Admitting: *Deleted

## 2022-10-16 MED ORDER — LISINOPRIL 40 MG PO TABS: 40.0000 mg | ORAL_TABLET | Freq: Every day | ORAL | 1 refills | Status: DC

## 2022-11-09 ENCOUNTER — Other Ambulatory Visit: Payer: Self-pay | Admitting: Family Medicine

## 2022-11-13 ENCOUNTER — Other Ambulatory Visit: Payer: Self-pay | Admitting: Cardiovascular Disease

## 2022-11-26 ENCOUNTER — Encounter (HOSPITAL_BASED_OUTPATIENT_CLINIC_OR_DEPARTMENT_OTHER): Payer: Self-pay

## 2022-11-26 ENCOUNTER — Inpatient Hospital Stay (HOSPITAL_BASED_OUTPATIENT_CLINIC_OR_DEPARTMENT_OTHER)
Admission: EM | Admit: 2022-11-26 | Discharge: 2022-12-01 | DRG: 372 | Disposition: A | Discharge status: Home Health | Payer: Medicare PPO | Attending: Internal Medicine | Admitting: Internal Medicine

## 2022-11-26 ENCOUNTER — Other Ambulatory Visit: Payer: Self-pay

## 2022-11-26 DIAGNOSIS — Z7951 Long term (current) use of inhaled steroids: Secondary | ICD-10-CM

## 2022-11-26 DIAGNOSIS — Z880 Allergy status to penicillin: Secondary | ICD-10-CM | POA: Diagnosis not present

## 2022-11-26 DIAGNOSIS — Z888 Allergy status to other drugs, medicaments and biological substances status: Secondary | ICD-10-CM | POA: Diagnosis not present

## 2022-11-26 DIAGNOSIS — I110 Hypertensive heart disease with heart failure: Secondary | ICD-10-CM | POA: Diagnosis present

## 2022-11-26 DIAGNOSIS — D72829 Elevated white blood cell count, unspecified: Secondary | ICD-10-CM

## 2022-11-26 DIAGNOSIS — E860 Dehydration: Secondary | ICD-10-CM | POA: Diagnosis present

## 2022-11-26 DIAGNOSIS — Z9841 Cataract extraction status, right eye: Secondary | ICD-10-CM

## 2022-11-26 DIAGNOSIS — I5032 Chronic diastolic (congestive) heart failure: Secondary | ICD-10-CM | POA: Diagnosis present

## 2022-11-26 DIAGNOSIS — K529 Noninfective gastroenteritis and colitis, unspecified: Secondary | ICD-10-CM

## 2022-11-26 DIAGNOSIS — E782 Mixed hyperlipidemia: Secondary | ICD-10-CM | POA: Diagnosis present

## 2022-11-26 DIAGNOSIS — K219 Gastro-esophageal reflux disease without esophagitis: Secondary | ICD-10-CM | POA: Diagnosis present

## 2022-11-26 DIAGNOSIS — E8729 Other acidosis: Secondary | ICD-10-CM

## 2022-11-26 DIAGNOSIS — Z8249 Family history of ischemic heart disease and other diseases of the circulatory system: Secondary | ICD-10-CM

## 2022-11-26 DIAGNOSIS — A0472 Enterocolitis due to Clostridium difficile, not specified as recurrent: Secondary | ICD-10-CM | POA: Diagnosis present

## 2022-11-26 DIAGNOSIS — Z79899 Other long term (current) drug therapy: Secondary | ICD-10-CM | POA: Diagnosis not present

## 2022-11-26 DIAGNOSIS — Z9842 Cataract extraction status, left eye: Secondary | ICD-10-CM | POA: Diagnosis not present

## 2022-11-26 DIAGNOSIS — C9110 Chronic lymphocytic leukemia of B-cell type not having achieved remission: Secondary | ICD-10-CM | POA: Diagnosis present

## 2022-11-26 DIAGNOSIS — J4540 Moderate persistent asthma, uncomplicated: Secondary | ICD-10-CM | POA: Diagnosis present

## 2022-11-26 DIAGNOSIS — I10 Essential (primary) hypertension: Secondary | ICD-10-CM | POA: Diagnosis present

## 2022-11-26 DIAGNOSIS — E872 Acidosis, unspecified: Secondary | ICD-10-CM | POA: Diagnosis present

## 2022-11-26 DIAGNOSIS — E861 Hypovolemia: Secondary | ICD-10-CM | POA: Diagnosis present

## 2022-11-26 DIAGNOSIS — N179 Acute kidney failure, unspecified: Secondary | ICD-10-CM | POA: Diagnosis present

## 2022-11-26 DIAGNOSIS — Z87891 Personal history of nicotine dependence: Secondary | ICD-10-CM

## 2022-11-26 LAB — COMPREHENSIVE METABOLIC PANEL
ALT: 19 U/L (ref 0–44)
AST: 43 U/L — ABNORMAL HIGH (ref 15–41)
Albumin: 3.8 g/dL (ref 3.5–5.0)
Alkaline Phosphatase: 36 U/L — ABNORMAL LOW (ref 38–126)
Anion gap: 18 — ABNORMAL HIGH (ref 5–15)
BUN: 43 mg/dL — ABNORMAL HIGH (ref 8–23)
CO2: 18 mmol/L — ABNORMAL LOW (ref 22–32)
Calcium: 7.8 mg/dL — ABNORMAL LOW (ref 8.9–10.3)
Chloride: 97 mmol/L — ABNORMAL LOW (ref 98–111)
Creatinine, Ser: 3.4 mg/dL — ABNORMAL HIGH (ref 0.44–1.00)
GFR, Estimated: 13 mL/min — ABNORMAL LOW (ref >60)
Glucose, Bld: 117 mg/dL — ABNORMAL HIGH (ref 70–99)
Potassium: 3.9 mmol/L (ref 3.5–5.1)
Sodium: 133 mmol/L — ABNORMAL LOW (ref 135–145)
Total Bilirubin: 0.8 mg/dL (ref 0.3–1.2)
Total Protein: 6.6 g/dL (ref 6.5–8.1)

## 2022-11-26 LAB — CBC
HCT: 39.7 % (ref 36.0–46.0)
Hemoglobin: 13.5 g/dL (ref 12.0–15.0)
MCH: 30 pg (ref 26.0–34.0)
MCHC: 34 g/dL (ref 30.0–36.0)
MCV: 88.2 fL (ref 80.0–100.0)
Platelets: 233 10*3/uL (ref 150–400)
RBC: 4.5 MIL/uL (ref 3.87–5.11)
RDW: 13.9 % (ref 11.5–15.5)
WBC: 14.7 10*3/uL — ABNORMAL HIGH (ref 4.0–10.5)
nRBC: 0 % (ref 0.0–0.2)

## 2022-11-26 LAB — MAGNESIUM: Magnesium: 1.9 mg/dL (ref 1.7–2.4)

## 2022-11-26 LAB — LIPASE, BLOOD: Lipase: 10 U/L — ABNORMAL LOW (ref 11–51)

## 2022-11-26 LAB — SEDIMENTATION RATE: Sed Rate: 30 mm/hr — ABNORMAL HIGH (ref 0–22)

## 2022-11-26 MED ORDER — MOMETASONE FURO-FORMOTEROL FUM 100-5 MCG/ACT IN AERO
2.0000 | INHALATION_SPRAY | Freq: Two times a day (BID) | RESPIRATORY_TRACT | Status: DC
  Administered 2022-11-27 – 2022-12-01 (×8): 2 via RESPIRATORY_TRACT
  Filled 2022-11-26 (×2): qty 8.8

## 2022-11-26 MED ORDER — IPRATROPIUM-ALBUTEROL 0.5-2.5 (3) MG/3ML IN SOLN: 3.0000 mL | Freq: Four times a day (QID) | RESPIRATORY_TRACT | Status: DC | PRN

## 2022-11-26 MED ORDER — ACETAMINOPHEN 650 MG RE SUPP: 650.0000 mg | Freq: Four times a day (QID) | RECTAL | Status: DC | PRN

## 2022-11-26 MED ORDER — HEPARIN SODIUM (PORCINE) 5000 UNIT/ML IJ SOLN
5000.0000 [IU] | Freq: Three times a day (TID) | INTRAMUSCULAR | Status: DC
  Administered 2022-11-26 – 2022-12-01 (×13): 5000 [IU] via SUBCUTANEOUS
  Filled 2022-11-26 (×13): qty 1

## 2022-11-26 MED ORDER — SODIUM CHLORIDE 0.9 % IV SOLN: INTRAVENOUS | Status: AC

## 2022-11-26 MED ORDER — SODIUM CHLORIDE 0.9 % IV BOLUS
1000.0000 mL | Freq: Once | INTRAVENOUS | Status: AC
  Administered 2022-11-26: 1000 mL via INTRAVENOUS

## 2022-11-26 MED ORDER — MORPHINE SULFATE (PF) 2 MG/ML IV SOLN
2.0000 mg | Freq: Once | INTRAVENOUS | Status: AC
  Administered 2022-11-26: 2 mg via INTRAVENOUS
  Filled 2022-11-26: qty 1

## 2022-11-26 MED ORDER — PANTOPRAZOLE SODIUM 40 MG PO TBEC
40.0000 mg | DELAYED_RELEASE_TABLET | Freq: Every day | ORAL | Status: DC
  Administered 2022-11-26 – 2022-12-01 (×6): 40 mg via ORAL
  Filled 2022-11-26 (×6): qty 1

## 2022-11-26 MED ORDER — ONDANSETRON HCL 4 MG PO TABS: 4.0000 mg | ORAL_TABLET | Freq: Four times a day (QID) | ORAL | Status: DC | PRN

## 2022-11-26 MED ORDER — MELATONIN 5 MG PO TABS
5.0000 mg | ORAL_TABLET | Freq: Every evening | ORAL | Status: AC | PRN
  Administered 2022-11-26 – 2022-11-27 (×2): 5 mg via ORAL
  Filled 2022-11-26 (×2): qty 1

## 2022-11-26 MED ORDER — ONDANSETRON HCL 4 MG/2ML IJ SOLN
4.0000 mg | Freq: Once | INTRAMUSCULAR | Status: AC
  Administered 2022-11-26: 4 mg via INTRAVENOUS
  Filled 2022-11-26: qty 2

## 2022-11-26 MED ORDER — METOPROLOL TARTRATE 5 MG/5ML IV SOLN: 5.0000 mg | Freq: Four times a day (QID) | INTRAVENOUS | Status: DC | PRN

## 2022-11-26 MED ORDER — ACETAMINOPHEN 325 MG PO TABS
650.0000 mg | ORAL_TABLET | Freq: Four times a day (QID) | ORAL | Status: DC | PRN
  Administered 2022-11-27: 650 mg via ORAL
  Filled 2022-11-26: qty 2

## 2022-11-26 MED ORDER — ONDANSETRON HCL 4 MG/2ML IJ SOLN: 4.0000 mg | Freq: Four times a day (QID) | INTRAMUSCULAR | Status: DC | PRN

## 2022-11-26 NOTE — Assessment & Plan Note (Signed)
Chronic leukocytosis dating back at least 4 years ago.  Around baseline and also likely hemoconcentrated from dehydration from diarrhea  No signs/symptoms of infection, UA pending Trend CBC/

## 2022-11-26 NOTE — Progress Notes (Signed)
Plan of Care Note for accepted transfer   Patient: Deborah Branch MRN: [id number]   DOA: 11/26/2022  Facility requesting transfer: [id number] Requesting Provider: Dr. Adela Lank Reason for transfer: diarrhea with AKI  Facility course: 88 yo with PMHx significant for mild asthma, GERD, HTN, HLD, chronic diastolic CHF who presented with complaints of diarrhea. She has history of chronic diarrhea, but has gotten worse. She has had fatigue, trouble getting around. Her son visited and was worried about her and brought her to ED.   Vitals: afebrile, HR: 71, RR: 19, bp: 104/37, oxygen 98%RA Pertinent labs: wbc: 14.7, BUN: 43, creatinine: 3.40, AST: 43, AG: 18, co2: 18,  In ED: stool studies ordered. UA pending. Given 1L IVF bolus and zofran/morphine, [facility] asked to admit.   Plan of care: The patient is accepted for admission to Telemetry unit, at [facility]. Or [facility].  Adding on strict I/O and continued gentle fluids. Echo in 6/23: normal EF with grade 1 diastolic dysfunction  Added urine studies  Diet placed  Tele x 24 hours    Author: Orland Mustard, MD 11/26/2022  Check [email or web address] for on-call coverage.  Nursing staff, Please call [facility] Admits & Consults System-Wide number on Amion as soon as patient's arrival, so appropriate admitting provider can evaluate the pt.

## 2022-11-26 NOTE — Assessment & Plan Note (Signed)
Continue PPI ?

## 2022-11-26 NOTE — Assessment & Plan Note (Signed)
Hold zocor until renal function improves

## 2022-11-26 NOTE — ED Triage Notes (Signed)
Patient here POV from Home.  Endorses Diarrhea for a few days. No Dysuria. No N/V. No Known Fevers. No Pain.   NAD Noted during Triage. A&Ox4. GCS 15. BIB Wheelchair.

## 2022-11-26 NOTE — Assessment & Plan Note (Addendum)
Continue amlodipine 5mg  daily - home regimen resumed at discharge

## 2022-11-26 NOTE — Plan of Care (Signed)
  Problem: Education: Goal: Knowledge of General Education information will improve Description: Including pain rating scale, medication(s)/side effects and non-pharmacologic comfort measures Outcome: Progressing   Problem: Health Behavior/Discharge Planning: Goal: Ability to manage health-related needs will improve Outcome: Progressing   Problem: Activity: Goal: Risk for activity intolerance will decrease Outcome: Progressing   

## 2022-11-26 NOTE — Assessment & Plan Note (Signed)
Echo in 11/2021: normal EF with grade 1 DD  -Clinically hypovolemic on admission - Tolerated fluids

## 2022-11-26 NOTE — ED Provider Notes (Signed)
[place] EMERGENCY DEPARTMENT AT [facility] Provider Note   CSN: [id number] Arrival date & time: 11/26/22  1433     History  Chief Complaint  Patient presents with   Diarrhea    Deborah Branch is a 88 y.o. female.  88 yo F with a chief complaints of diarrhea.  This been going on for the better part of the week.  She denies any sick contacts denies suspicious food intake denies recent travel.  She has been eating and drinking but every time she does she has a bowel movement.  Has become increasingly fatigued.  Had a visit by her son who was concerned and brought her here for evaluation.  She denies any abdominal pain denies dark or bloody stool denies recent antibiotic use.   Diarrhea      Home Medications Prior to Admission medications   Medication Sig Start Date End Date Taking? Authorizing Provider  amLODipine (NORVASC) 5 MG tablet TAKE 1 TABLET BY MOUTH ONCE DAILY 11/13/22   O'Neal, Ronnald Ramp, MD  calcium carbonate (OSCAL) 1500 (600 Ca) MG TABS tablet Take 1 tablet by mouth 2 (two) times daily with a meal.    [provider]  cetirizine (ZYRTEC) 10 MG tablet Take 10 mg by mouth daily.    [provider]  denosumab (PROLIA) 60 MG/ML SOSY injection Inject 60 mg into the skin every 6 (six) months.    [provider]  fluticasone-salmeterol (ADVAIR DISKUS) 100-50 MCG/ACT AEPB Inhale 1 puff into the lungs 2 (two) times daily. 07/07/22   Jetty Duhamel D, MD  furosemide (LASIX) 20 MG tablet Take 20 mg by mouth.    [provider]  ipratropium-albuterol (DUONEB) 0.5-2.5 (3) MG/3ML SOLN Take 3 mLs by nebulization every 6 (six) hours as needed (shortness of breath and cough). 12/09/21   Shon Hale, MD  lisinopril (ZESTRIL) 40 MG tablet Take 1 tablet (40 mg total) by mouth daily. 10/16/22 10/16/23  Gabriel Earing, FNP  Omalizumab [place]) Inject 225 mg into the skin every 3 (three) months.    [provider]  omeprazole  (PRILOSEC) 20 MG capsule TAKE ONE CAPSULE BY MOUTH DAILY 11/09/22   Gabriel Earing, FNP  simvastatin (ZOCOR) 20 MG tablet Take 1 tablet (20 mg total) by mouth at bedtime. 05/10/22   Gabriel Earing, FNP  zinc gluconate 50 MG tablet Take 50 mg by mouth daily.    [provider]      Allergies    Clarithromycin and Penicillins    Review of Systems   Review of Systems  Gastrointestinal:  Positive for diarrhea.    Physical Exam Updated Vital Signs BP (!) 134/41 (BP Location: Right Arm)   Pulse 83   Temp 98.2 F (36.8 C) (Oral)   Resp 18   Ht 5' (1.524 m)   Wt 69.5 kg   SpO2 100%   BMI 29.92 kg/m  Physical Exam Vitals and nursing note reviewed.  Constitutional:      General: She is not in acute distress.    Appearance: She is well-developed. She is not diaphoretic.  HENT:     Head: Normocephalic and atraumatic.  Eyes:     Pupils: Pupils are equal, round, and reactive to light.  Cardiovascular:     Rate and Rhythm: Normal rate and regular rhythm.     Heart sounds: No murmur heard.    No friction rub. No gallop.  Pulmonary:     Effort: Pulmonary effort is  normal.     Breath sounds: No wheezing or rales.  Abdominal:     General: There is no distension.     Palpations: Abdomen is soft.     Tenderness: There is no abdominal tenderness.  Musculoskeletal:        General: No tenderness.     Cervical back: Normal range of motion and neck supple.  Skin:    General: Skin is warm and dry.  Neurological:     Mental Status: She is alert and oriented to person, place, and time.  Psychiatric:        Behavior: Behavior normal.     ED Results / Procedures / Treatments   Labs (all labs ordered are listed, but only abnormal results are displayed) Labs Reviewed  LIPASE, BLOOD - Abnormal; Notable for the following components:      Result Value   Lipase 10 (*)    All other components within normal limits  COMPREHENSIVE METABOLIC PANEL - Abnormal; Notable for the  following components:   Sodium 133 (*)    Chloride 97 (*)    CO2 18 (*)    Glucose, Bld 117 (*)    BUN 43 (*)    Creatinine, Ser 3.40 (*)    Calcium 7.8 (*)    AST 43 (*)    Alkaline Phosphatase 36 (*)    GFR, Estimated 13 (*)    Anion gap 18 (*)    All other components within normal limits  CBC - Abnormal; Notable for the following components:   WBC 14.7 (*)    All other components within normal limits  GASTROINTESTINAL PANEL BY PCR, STOOL (REPLACES STOOL CULTURE)  C DIFFICILE QUICK SCREEN W PCR REFLEX    MAGNESIUM  URINALYSIS, ROUTINE W REFLEX MICROSCOPIC  SODIUM, URINE, RANDOM  CREATININE, URINE, RANDOM    EKG None  Radiology No results found.  Procedures .Critical Care  Performed by: Melene Plan, DO Authorized by: Melene Plan, DO   Critical care provider statement:    Critical care time (minutes):  35   Critical care time was exclusive of:  Separately billable procedures and treating other patients   Critical care was time spent personally by me on the following activities:  Development of treatment plan with patient or surrogate, discussions with consultants, evaluation of patient's response to treatment, examination of patient, ordering and review of laboratory studies, ordering and review of radiographic studies, ordering and performing treatments and interventions, pulse oximetry, re-evaluation of patient's condition and review of old charts   Care discussed with: admitting provider       Medications Ordered in ED Medications  0.9 %  sodium chloride infusion (has no administration in time range)  sodium chloride 0.9 % bolus 1,000 mL (0 mLs Intravenous Stopped 11/26/22 1621)  morphine (PF) 2 MG/ML injection 2 mg (2 mg Intravenous Given 11/26/22 1557)  ondansetron (ZOFRAN) injection 4 mg (4 mg Intravenous Given 11/26/22 1555)    ED Course/ Medical Decision Making/ A&P                             Medical Decision Making Amount and/or Complexity of Data  Reviewed Labs: ordered.  Risk Prescription drug management. Decision regarding hospitalization.   88 yo F with a chief complaint of diarrhea.  Going on for a week.  Patient blood pressure is a bit soft on arrival here.  No changes to mentation.  Patient has an acute kidney injury  on her blood work.  Baseline creatinine of 1 creatinine today 3.4.  She also has a metabolic acidosis with anion gap, I suspect most likely this is a ketosis.  Will give IV fluids.  Will discuss with medicine for admission.  Patient's maps initially less than 65.  Improved with IV fluids.  Likely secondary to dehydration and not due to infection.  The patients results and plan were reviewed and discussed.   Any x-rays performed were independently reviewed by myself.   Differential diagnosis were considered with the presenting HPI.  Medications  0.9 %  sodium chloride infusion (has no administration in time range)  sodium chloride 0.9 % bolus 1,000 mL (0 mLs Intravenous Stopped 11/26/22 1621)  morphine (PF) 2 MG/ML injection 2 mg (2 mg Intravenous Given 11/26/22 1557)  ondansetron (ZOFRAN) injection 4 mg (4 mg Intravenous Given 11/26/22 1555)    Vitals:   11/26/22 1530 11/26/22 1600 11/26/22 1630 11/26/22 1750  BP: (!) 94/46 (!) 104/37 112/71 (!) 134/41  Pulse: 73 71 73 83  Resp: 20 19 18 18   Temp:    98.2 F (36.8 C)  TempSrc:    Oral  SpO2: 99% 98% 100% 100%  Weight:    69.5 kg  Height:    5' (1.524 m)    Final diagnoses:  AKI (acute kidney injury) (HCC)  Hypovolemia    Admission/ observation were discussed with the admitting physician, patient and/or family and they are comfortable with the plan.          Final Clinical Impression(s) / ED Diagnoses Final diagnoses:  AKI (acute kidney injury) (HCC)  Hypovolemia    Rx / DC Orders ED Discharge Orders     None         Melene Plan, DO 11/26/22 1816

## 2022-11-26 NOTE — H&P (Signed)
History and Physical    Patient: Deborah Branch ZOX:[id number] DOB: 05-09-1935 DOA: 11/26/2022 DOS: the patient was seen and examined on 11/26/2022 PCP: Gabriel Earing, FNP  Patient coming from:  [facility]  - lives alone. Ambulates independently.    Chief Complaint: diarrhea/weakness.   HPI: Deborah Branch is a 88 y.o. female with medical history significant of mild asthma, GERD, HTN, HLD, chronic diastolic CHF who presented with complaints of diarrhea. She has had diarrhea x 7 days, but she started to feel weak about 3 days ago. She apparently has had countless episodes of diarrhea daily. Uncontrollable and too numerous to count. No fever/chills. She has no stomach pain. No N/V. No blood in stool. She has no international travel and has city water. Denies eating any raw meat/fish. No left out food/rice. No one else with diarrhea, but lives alone. She denies any trouble urinating. Can not tell if urine different color since comes out with diarrhea. No dysuria or flank pain. She has had poor PO intake x 1 week as well.   History of chronic diarrhea and was seen in August in 2023 by [facility]. Suspected functional diarrhea though microscopic colitis is also possible. Was supposed to try a low dose of colestid. Son states this was never sent in; however, office note shows she did take an entire months worth of colestid and this did not help and the imodium helped.   Last colonoscopy in 2014 with hx of adenoma polyp. No polyps on last cscope in 2014. No recall due to age.    Denies any fever/chills, vision changes/headaches, chest pain or palpitations, shortness of breath or cough, abdominal pain, N/V, dysuria or leg swelling.    She does not smoke or drink alcohol.   ER Course:  Vitals: afebrile, HR: 71, RR: 19, bp: 104/37, oxygen 98%RA Pertinent labs: wbc: 14.7, BUN: 43, creatinine: 3.40, AST: 43, AG: 18, co2: 18,  In ED: stool studies ordered. UA pending. Given 1L IVF bolus and zofran/morphine, TRH  asked to admit.    Review of Systems: As mentioned in the history of present illness. All other systems reviewed and are negative. Past Medical History:  Diagnosis Date   Allergic asthma    Asthma    Cataract    Congestive heart failure (CHF) (HCC)    Eczema    GERD (gastroesophageal reflux disease)    Hyperlipidemia    Hypertension    Osteoporosis    Past Surgical History:  Procedure Laterality Date   CATARACT EXTRACTION, BILATERAL  2012   Social History:  reports that she has quit smoking. Her smoking use included cigarettes. She has never used smokeless tobacco. She reports that she does not currently use alcohol. She reports that she does not use drugs.  Allergies  Allergen Reactions   Clarithromycin     REACTION: itching   Penicillins     REACTION: hives    Family History  Problem Relation Age of Onset   Heart attack Paternal Grandfather    Colon cancer Neg Hx    Esophageal cancer Neg Hx    Rectal cancer Neg Hx    Stomach cancer Neg Hx     Prior to Admission medications   Medication Sig Start Date End Date Taking? Authorizing Provider  amLODipine (NORVASC) 5 MG tablet TAKE 1 TABLET BY MOUTH ONCE DAILY 11/13/22   O'Neal, Ronnald Ramp, MD  calcium carbonate (OSCAL) 1500 (600 Ca) MG TABS tablet Take 1 tablet by mouth 2 (two) times  daily with a meal.    [provider]  cetirizine (ZYRTEC) 10 MG tablet Take 10 mg by mouth daily.    [provider]  denosumab (PROLIA) 60 MG/ML SOSY injection Inject 60 mg into the skin every 6 (six) months.    [provider]  fluticasone-salmeterol (ADVAIR DISKUS) 100-50 MCG/ACT AEPB Inhale 1 puff into the lungs 2 (two) times daily. 07/07/22   Jetty Duhamel D, MD  furosemide (LASIX) 20 MG tablet Take 20 mg by mouth.    [provider]  ipratropium-albuterol (DUONEB) 0.5-2.5 (3) MG/3ML SOLN Take 3 mLs by nebulization every 6 (six) hours as needed (shortness of breath and cough). 12/09/21   Shon Hale, MD  lisinopril (ZESTRIL) 40 MG tablet Take 1 tablet (40 mg total) by mouth daily. 10/16/22 10/16/23  Gabriel Earing, FNP  Omalizumab [place]) Inject 225 mg into the skin every 3 (three) months.    [provider]  omeprazole (PRILOSEC) 20 MG capsule TAKE ONE CAPSULE BY MOUTH DAILY 11/09/22   Gabriel Earing, FNP  simvastatin (ZOCOR) 20 MG tablet Take 1 tablet (20 mg total) by mouth at bedtime. 05/10/22   Gabriel Earing, FNP  zinc gluconate 50 MG tablet Take 50 mg by mouth daily.    [provider]    Physical Exam: Vitals:   11/26/22 1530 11/26/22 1600 11/26/22 1630 11/26/22 1750  BP: (!) 94/46 (!) 104/37 112/71 (!) 134/41  Pulse: 73 71 73 83  Resp: 20 19 18 18   Temp:    98.2 F (36.8 C)  TempSrc:    Oral  SpO2: 99% 98% 100% 100%  Weight:    69.5 kg  Height:    5' (1.524 m)   General:  Appears calm and comfortable and is in NAD Eyes:  PERRL, EOMI, normal lids, iris ENT:  HOH, lips & tongue, dry mucous membranes; appropriate dentition Neck:  no LAD, masses or thyromegaly; no carotid bruits Cardiovascular:  RRR, no m/r/g. No LE edema.  Respiratory:   CTA bilaterally with no wheezes/rales/rhonchi.  Normal respiratory effort. Abdomen:  soft, NT, ND, NABS Back:   normal alignment, no CVAT Skin:  no rash or induration seen on limited exam Musculoskeletal:  grossly normal tone BUE/BLE, good ROM, no bony abnormality Lower extremity:  No LE edema.  Limited foot exam with no ulcerations.  2+ distal pulses. Psychiatric:  grossly normal mood and affect, speech fluent and appropriate, AOx3 Neurologic:  CN 2-12 grossly intact, moves all extremities in coordinated fashion, sensation intact   Radiological Exams on Admission: Independently reviewed - see discussion in A/P where applicable  No results found.  EKG: Independently reviewed.  NSR with rate 78; nonspecific ST changes with no evidence of acute ischemia. Sinus arrhythmia    Labs on Admission: I  have personally reviewed the available labs and imaging studies at the time of the admission.  Pertinent labs:   wbc: 14.7,  BUN: 43, creatinine: 3.40, AST: 43,  AG: 18,  co2: 18,   Assessment and Plan: Principal Problem:   Acute renal failure (ARF) (HCC) Active Problems:   Metabolic acidosis, increased anion gap   Leukocytosis   Chronic diarrhea   Chronic diastolic heart failure (HCC)   Moderate persistent asthma without complication   GERD (gastroesophageal reflux disease)   Primary hypertension   Mixed hyperlipidemia    Assessment and Plan: * Acute renal failure (ARF) (HCC) 88 year old presenting with 1 week history of diarrhea and poor PO  intake found to be dehydrated with weakness/fatigue and acute kidney injury -obs to tele -received 1L IVF bolus in Ed, continue IVF at 75cc/hour overnight  -check urine studies although suspect pre renal in setting of diarrhea/poor PO intake and some hypotension  -check bladder scan -hold nephrotoxic drugs -strict I/O -follow up on UA/urine studies -trend BMP  -no episodes of diarrhea since being in ED/hospital. W/u pending   Metabolic acidosis, increased anion gap Likely due to diarrhea/poor PO intake/AKI Suspect this will normalize with IVF/hydration and treating diarrhea  Continue to trend   Chronic diarrhea 5-10 year history of intermittent diarrhea  She states it is worse now Saw GI in 01/2022, trial of colestid x 30 days.  No abdominal pain associated with the diarrhea. ? IBS vs. SIBO vs. IBD vs. Celiacs vs. Infectious Stool panel/c.diff pending Check TSH, ESR, CRP and celiac panel No abnormal biopsies reported from cscope in 2014 even though this may have started after this time.  Currently no episodes of diarrhea since being in ED/hospital  Imodium PRN once stool PCR results   Leukocytosis Chronic leukocytosis dating back at least 4 years ago.  Around baseline and also likely hemoconcentrated from dehydration from  diarrhea  No signs/symptoms of infection, UA pending Trend CBC/  Chronic diastolic heart failure (HCC) Dry on exam Echo in 11/2021: normal EF with grade 1 DD  Monitor volume status Daily weights   Moderate persistent asthma without complication No signs/symptoms of exacerbation Continue advair Duoneb BID Receives xolair [place] q 3 months   Primary hypertension Continue amlodipine 5mg  daily Hold lisinopril and lasix in setting of AKI  Check orthostatics   GERD (gastroesophageal reflux disease) Continue PPI   Mixed hyperlipidemia Hold zocor until renal function improves     Advance Care Planning:   Code Status: Full Code   Consults: none   DVT Prophylaxis: heparin [place]   Family Communication: son at bedside.   Severity of Illness: The appropriate patient status for this patient is OBSERVATION. Observation status is judged to be reasonable and necessary in order to provide the required intensity of service to ensure the patient's safety. The patient's presenting symptoms, physical exam findings, and initial radiographic and laboratory data in the context of their medical condition is felt to place them at decreased risk for further clinical deterioration. Furthermore, it is anticipated that the patient will be medically stable for discharge from the hospital within 2 midnights of admission.   Author: Orland Mustard, MD 11/26/2022 7:46 PM  For on call review [email or web address].

## 2022-11-26 NOTE — Assessment & Plan Note (Addendum)
-   Does have some chronic history of intermittent diarrhea - Presented with worsening diarrhea for approximately 1 week - C. difficile positive on admission (toxin positive, antigen positive) - Continue fidaxomicin - diarrhea slowly improving prior to d/c; patient comfortable with d/c home and able to ambulate to bathroom independently and nutrition also had improved

## 2022-11-26 NOTE — Assessment & Plan Note (Signed)
No signs/symptoms of exacerbation Continue advair Duoneb BID Receives xolair [place] q 3 months

## 2022-11-26 NOTE — Assessment & Plan Note (Signed)
88 year old presenting with 1 week history of diarrhea and poor PO intake found to be dehydrated with weakness/fatigue and acute kidney injury -obs to tele -received 1L IVF bolus in Ed, continue IVF at 75cc/hour overnight  -check urine studies although suspect pre renal in setting of diarrhea/poor PO intake and some hypotension  -check bladder scan -hold nephrotoxic drugs -strict I/O -follow up on UA/urine studies -trend BMP  -no episodes of diarrhea since being in ED/hospital. W/u pending

## 2022-11-26 NOTE — Assessment & Plan Note (Signed)
Likely due to diarrhea/poor PO intake/AKI Suspect this will normalize with IVF/hydration and treating diarrhea  Continue to trend

## 2022-11-26 NOTE — ED Notes (Signed)
Report given to [facility] and the floor RN. 

## 2022-11-27 ENCOUNTER — Other Ambulatory Visit (HOSPITAL_COMMUNITY): Payer: Self-pay

## 2022-11-27 ENCOUNTER — Encounter: Payer: Self-pay | Admitting: Internal Medicine

## 2022-11-27 DIAGNOSIS — K219 Gastro-esophageal reflux disease without esophagitis: Secondary | ICD-10-CM | POA: Diagnosis present

## 2022-11-27 DIAGNOSIS — K529 Noninfective gastroenteritis and colitis, unspecified: Secondary | ICD-10-CM | POA: Diagnosis not present

## 2022-11-27 DIAGNOSIS — Z9842 Cataract extraction status, left eye: Secondary | ICD-10-CM | POA: Diagnosis not present

## 2022-11-27 DIAGNOSIS — E860 Dehydration: Secondary | ICD-10-CM | POA: Diagnosis present

## 2022-11-27 DIAGNOSIS — E861 Hypovolemia: Secondary | ICD-10-CM

## 2022-11-27 DIAGNOSIS — I5032 Chronic diastolic (congestive) heart failure: Secondary | ICD-10-CM | POA: Diagnosis present

## 2022-11-27 DIAGNOSIS — A0472 Enterocolitis due to Clostridium difficile, not specified as recurrent: Secondary | ICD-10-CM | POA: Diagnosis present

## 2022-11-27 DIAGNOSIS — E8729 Other acidosis: Secondary | ICD-10-CM | POA: Diagnosis not present

## 2022-11-27 DIAGNOSIS — Z87891 Personal history of nicotine dependence: Secondary | ICD-10-CM | POA: Diagnosis not present

## 2022-11-27 DIAGNOSIS — Z9841 Cataract extraction status, right eye: Secondary | ICD-10-CM | POA: Diagnosis not present

## 2022-11-27 DIAGNOSIS — I110 Hypertensive heart disease with heart failure: Secondary | ICD-10-CM | POA: Diagnosis present

## 2022-11-27 DIAGNOSIS — Z7951 Long term (current) use of inhaled steroids: Secondary | ICD-10-CM | POA: Diagnosis not present

## 2022-11-27 DIAGNOSIS — E872 Acidosis, unspecified: Secondary | ICD-10-CM | POA: Diagnosis present

## 2022-11-27 DIAGNOSIS — E782 Mixed hyperlipidemia: Secondary | ICD-10-CM | POA: Diagnosis present

## 2022-11-27 DIAGNOSIS — Z79899 Other long term (current) drug therapy: Secondary | ICD-10-CM | POA: Diagnosis not present

## 2022-11-27 DIAGNOSIS — Z880 Allergy status to penicillin: Secondary | ICD-10-CM | POA: Diagnosis not present

## 2022-11-27 DIAGNOSIS — Z8249 Family history of ischemic heart disease and other diseases of the circulatory system: Secondary | ICD-10-CM | POA: Diagnosis not present

## 2022-11-27 DIAGNOSIS — Z888 Allergy status to other drugs, medicaments and biological substances status: Secondary | ICD-10-CM | POA: Diagnosis not present

## 2022-11-27 DIAGNOSIS — C9110 Chronic lymphocytic leukemia of B-cell type not having achieved remission: Secondary | ICD-10-CM | POA: Diagnosis present

## 2022-11-27 DIAGNOSIS — N179 Acute kidney failure, unspecified: Secondary | ICD-10-CM

## 2022-11-27 DIAGNOSIS — J4540 Moderate persistent asthma, uncomplicated: Secondary | ICD-10-CM | POA: Diagnosis present

## 2022-11-27 LAB — C DIFFICILE QUICK SCREEN W PCR REFLEX
C Diff antigen: POSITIVE — AB
C Diff interpretation: DETECTED
C Diff toxin: POSITIVE — AB

## 2022-11-27 LAB — GASTROINTESTINAL PANEL BY PCR, STOOL (REPLACES STOOL CULTURE)

## 2022-11-27 LAB — CBC
HCT: 36.9 % (ref 36.0–46.0)
Hemoglobin: 11.9 g/dL — ABNORMAL LOW (ref 12.0–15.0)
MCH: 29.6 pg (ref 26.0–34.0)
MCHC: 32.2 g/dL (ref 30.0–36.0)
MCV: 91.8 fL (ref 80.0–100.0)
Platelets: 196 10*3/uL (ref 150–400)
RBC: 4.02 MIL/uL (ref 3.87–5.11)
RDW: 13.8 % (ref 11.5–15.5)
WBC: 12.6 10*3/uL — ABNORMAL HIGH (ref 4.0–10.5)
nRBC: 0 % (ref 0.0–0.2)

## 2022-11-27 LAB — BASIC METABOLIC PANEL
Anion gap: 12 (ref 5–15)
BUN: 49 mg/dL — ABNORMAL HIGH (ref 8–23)
CO2: 14 mmol/L — ABNORMAL LOW (ref 22–32)
Calcium: 6.6 mg/dL — ABNORMAL LOW (ref 8.9–10.3)
Chloride: 104 mmol/L (ref 98–111)
Creatinine, Ser: 3.07 mg/dL — ABNORMAL HIGH (ref 0.44–1.00)
GFR, Estimated: 14 mL/min — ABNORMAL LOW (ref >60)
Glucose, Bld: 107 mg/dL — ABNORMAL HIGH (ref 70–99)
Potassium: 3.6 mmol/L (ref 3.5–5.1)
Sodium: 130 mmol/L — ABNORMAL LOW (ref 135–145)

## 2022-11-27 LAB — C-REACTIVE PROTEIN: CRP: 19.4 mg/dL — ABNORMAL HIGH (ref <1.0)

## 2022-11-27 LAB — TSH: TSH: 2.873 u[IU]/mL (ref 0.350–4.500)

## 2022-11-27 MED ORDER — SODIUM CHLORIDE 0.45 % IV SOLN
INTRAVENOUS | Status: AC
  Filled 2022-11-27 (×2): qty 75

## 2022-11-27 MED ORDER — FIDAXOMICIN 200 MG PO TABS
200.0000 mg | ORAL_TABLET | Freq: Two times a day (BID) | ORAL | Status: DC
  Administered 2022-11-27 – 2022-12-01 (×8): 200 mg via ORAL
  Filled 2022-11-27 (×10): qty 1

## 2022-11-27 MED ORDER — QUETIAPINE FUMARATE 25 MG PO TABS
25.0000 mg | ORAL_TABLET | Freq: Every evening | ORAL | Status: DC | PRN
  Administered 2022-11-27: 25 mg via ORAL
  Filled 2022-11-27: qty 1

## 2022-11-27 NOTE — TOC CM/SW Note (Signed)
Transition of Care [facility]) - Inpatient Brief Assessment   Patient Details  Name: Deborah Branch MRN: [id number] Date of Birth: 1934/09/25  [facility]) CM/SW Contact:    Howell Rucks, RN Phone Number: 11/27/2022, 1:24 PM   Clinical Narrative: TOC Brief Assessment completed. No TOC needs identified at this time.     Transition of Care Asessment: Insurance and Status: Insurance coverage has been reviewed Patient has primary care physician: Yes Home environment has been reviewed: Lives alone, support from family Prior level of function:: Air traffic controller Home Services: No current home services Social Determinants of Health Reivew: SDOH reviewed no interventions necessary Readmission risk has been reviewed: Yes Transition of care needs: no transition of care needs at this time

## 2022-11-27 NOTE — Progress Notes (Addendum)
[facility] PROGRESS NOTE    Progress Note  KAMILE CICCARELLO  ZOX:[id number] DOB: Sep 13, 1934 DOA: 11/26/2022 PCP: Gabriel Earing, FNP     Brief Narrative:   Deborah Branch is an 88 y.o. female past medical history significant for mild asthma, essential hypertension, chronic diastolic heart failure comes in with diarrhea that started 7 days prior to admission she relates they are uncontrollable has been followed by GI last seen August 2023 suspect functional diarrhea versus microscopic colitis was supposed to try low-dose Colestid she has been noncompliant with as she relates it did not help Imodium did help last colonoscopy 2014 showed polyps   Assessment/Plan:   Acute renal failure (ARF) (HCC) With a baseline creatinine of around 1-1.2, on admission 3.4. Likely prerenal azotemia, and the setting of Lasix ACE inhibitor's. Was started on IV fluids and her creatinine has slowly improved. Strict I's and O's and daily weights.  Acute on chronic diarrhea likely due to C. difficile colitis: 5 to 10 years of intermittent diarrhea she states he is worse now. She saw GI in 2023 was supposed to be on Colestid but has not been noncompliant. Stool C. Difficile positive and toxin positive. Asked to start Dificid TSH 2.8,ESR 30, CPR 19.4 GI panel pending.  Metabolic acidosis, increased anion gap/normal anion gap metabolic acidosis With anion gap of 18.  Improved with IV fluids from 24 on admission with normal saline. Will change her to 0.5 with bicarbonate infusion recheck basic metabolic panel in the morning. Now metabolic acidosis is normal anion gap likely due to diarrhea  Chronic leukocytosis Probably due to CLL, will need to follow-up with hematology as an outpatient  Chronic diastolic heart failure (HCC) Dry on exam. Started on IV fluids antihypertensive medications were stopped on admission she was started on IV fluids and her blood pressure is improving. Hold ACE inhibitor and  Lasix.  Moderate persistent asthma without complication Continue Advair and DuoNebs.  Essential hypertension: Continue Norvasc, hold lisinopril and Lasix.  GERD: Continue PPI.  Hyperlipidemia: Continue Zocor.   DVT prophylaxis: lovenox Family Communication:none Status is: Observation The patient remains OBS appropriate and will d/c before 2 midnights.    Code Status:     Code Status Orders  (From admission, onward)           Start     Ordered   11/26/22 1934  Full code  Continuous       Question:  By:  Answer:  Consent: discussion documented in EHR   11/26/22 1934           Code Status History     Date Active Date Inactive Code Status Order ID Comments User Context   12/08/2021 0042 12/09/2021 1825 Full Code [id number]  Frankey Shown, DO ED         IV Access:   Peripheral IV   Procedures and diagnostic studies:   No results found.   Medical Consultants:   None.   Subjective:    [name] she relates her diarrhea have not improved.  Objective:    Vitals:   11/26/22 2133 11/27/22 0157 11/27/22 0500 11/27/22 0536  BP: (!) 129/45 (!) 126/112  (!) 119/45  Pulse: 97 82  77  Resp: 19 20  20   Temp: 100 F (37.8 C) 100.2 F (37.9 C)  97.6 F (36.4 C)  TempSrc: Oral Oral  Oral  SpO2: 95% 97%  99%  Weight:   70.4 kg   Height:  SpO2: 99 %   Intake/Output Summary (Last 24 hours) at 11/27/2022 0745 Last data filed at 11/27/2022 0600 Gross per 24 hour  Intake 1877.5 ml  Output --  Net 1877.5 ml   Filed Weights   11/26/22 1446 11/26/22 1750 11/27/22 0500  Weight: 73 kg 69.5 kg 70.4 kg    Exam: General exam: In no acute distress. Respiratory system: Good air movement and clear to auscultation. Cardiovascular system: S1 & S2 heard, RRR. No JVD.  Gastrointestinal system: Abdomen is nondistended, soft and nontender.  Extremities: No pedal edema. Skin: No rashes, lesions or ulcers Psychiatry: Judgement and insight appear  normal. Mood & affect appropriate.    Data Reviewed:    Labs: Basic Metabolic Panel: Recent Labs  Lab 11/26/22 1449 11/26/22 1533 11/27/22 0500  NA 133*  --  130*  K 3.9  --  3.6  CL 97*  --  104  CO2 18*  --  14*  GLUCOSE 117*  --  107*  BUN 43*  --  49*  CREATININE 3.40*  --  3.07*  CALCIUM 7.8*  --  6.6*  MG  --  1.9  --    GFR Estimated Creatinine Clearance: 11.5 mL/min (A) (by C-G formula based on SCr of 3.07 mg/dL (H)). Liver Function Tests: Recent Labs  Lab 11/26/22 1449  AST 43*  ALT 19  ALKPHOS 36*  BILITOT 0.8  PROT 6.6  ALBUMIN 3.8   Recent Labs  Lab 11/26/22 1449  LIPASE 10*   No results for input(s): "AMMONIA" in the last 168 hours. Coagulation profile No results for input(s): "INR", "PROTIME" in the last 168 hours. COVID-19 Labs  Recent Labs    11/26/22 12/05/22  CRP 19.4*    Lab Results  Component Value Date   SARSCOV2NAA NEGATIVE 12/07/2021    CBC: Recent Labs  Lab 11/26/22 1449 11/27/22 0500  WBC 14.7* 12.6*  HGB 13.5 11.9*  HCT 39.7 36.9  MCV 88.2 91.8  PLT 233 196   Cardiac Enzymes: No results for input(s): "CKTOTAL", "CKMB", "CKMBINDEX", "TROPONINI" in the last 168 hours. BNP (last 3 results) No results for input(s): "PROBNP" in the last 8760 hours. CBG: No results for input(s): "GLUCAP" in the last 168 hours. D-Dimer: No results for input(s): "DDIMER" in the last 72 hours. Hgb A1c: No results for input(s): "HGBA1C" in the last 72 hours. Lipid Profile: No results for input(s): "CHOL", "HDL", "LDLCALC", "TRIG", "CHOLHDL", "LDLDIRECT" in the last 72 hours. Thyroid function studies: Recent Labs    11/26/22 12/05/22  TSH 2.873   Anemia work up: No results for input(s): "VITAMINB12", "FOLATE", "FERRITIN", "TIBC", "IRON", "RETICCTPCT" in the last 72 hours. Sepsis Labs: Recent Labs  Lab 11/26/22 1449 11/27/22 0500  WBC 14.7* 12.6*   Microbiology No results found for this or any previous visit (from the past 240  hour(s)).   Medications:    heparin  5,000 Units Subcutaneous Q8H   mometasone-formoterol  2 puff Inhalation BID   pantoprazole  40 mg Oral Daily   Continuous Infusions:    LOS: 0 days   Marinda Elk  [facility]  11/27/2022, 7:45 AM

## 2022-11-27 NOTE — Progress Notes (Signed)
Mobility Specialist - Progress Note   11/27/22 1404  Mobility  Activity Transferred from bed to chair  Level of Assistance Minimal assist, patient does 75% or more  Assistive Device None  Distance Ambulated (ft) 4 ft  Range of Motion/Exercises Active  Activity Response Tolerated well  Mobility Referral Yes  $Mobility charge 1 Mobility  Mobility Specialist Start Time (ACUTE ONLY) 1355  Mobility Specialist Stop Time (ACUTE ONLY) 1402  Mobility Specialist Time Calculation (min) (ACUTE ONLY) 7 min   Pt was found on recliner chair stating wanting to get in bed. Asked pt if she wanted to ambulate prior to getting in bed and stated feeling too tired. Pt was able to transfer to bed without any complaints. Was left with call bell and necessities in reach.   Billey Chang Mobility Specialist

## 2022-11-27 NOTE — TOC Benefit Eligibility Note (Signed)
Patient Product/process development scientist completed.    The patient is currently admitted and upon discharge could be taking Dificid 200 mg tablets.  The current 10 day co-pay is $100.00.   The patient is insured through [employer] Part D   This test claim was processed through [facility]- copay amounts may vary at other pharmacies due to pharmacy/plan contracts, or as the patient moves through the different stages of their insurance plan.  Roland Earl, CPHT Pharmacy Patient Advocate Specialist [facility] Patient Advocate Team Direct Number: [phone]  Fax: [phone]

## 2022-11-27 NOTE — Plan of Care (Signed)

## 2022-11-28 DIAGNOSIS — K529 Noninfective gastroenteritis and colitis, unspecified: Secondary | ICD-10-CM | POA: Diagnosis not present

## 2022-11-28 DIAGNOSIS — E861 Hypovolemia: Secondary | ICD-10-CM | POA: Diagnosis not present

## 2022-11-28 DIAGNOSIS — E8729 Other acidosis: Secondary | ICD-10-CM | POA: Diagnosis not present

## 2022-11-28 DIAGNOSIS — N179 Acute kidney failure, unspecified: Secondary | ICD-10-CM | POA: Diagnosis not present

## 2022-11-28 LAB — CBC
HCT: 32.8 % — ABNORMAL LOW (ref 36.0–46.0)
Hemoglobin: 11 g/dL — ABNORMAL LOW (ref 12.0–15.0)
MCH: 29.7 pg (ref 26.0–34.0)
MCHC: 33.5 g/dL (ref 30.0–36.0)
MCV: 88.6 fL (ref 80.0–100.0)
Platelets: 191 10*3/uL (ref 150–400)
RBC: 3.7 MIL/uL — ABNORMAL LOW (ref 3.87–5.11)
RDW: 13.7 % (ref 11.5–15.5)
WBC: 8.7 10*3/uL (ref 4.0–10.5)
nRBC: 0 % (ref 0.0–0.2)

## 2022-11-28 LAB — BASIC METABOLIC PANEL
Anion gap: 8 (ref 5–15)
BUN: 58 mg/dL — ABNORMAL HIGH (ref 8–23)
CO2: 20 mmol/L — ABNORMAL LOW (ref 22–32)
Calcium: 6.1 mg/dL — CL (ref 8.9–10.3)
Chloride: 104 mmol/L (ref 98–111)
Creatinine, Ser: 1.81 mg/dL — ABNORMAL HIGH (ref 0.44–1.00)
GFR, Estimated: 27 mL/min — ABNORMAL LOW (ref >60)
Glucose, Bld: 110 mg/dL — ABNORMAL HIGH (ref 70–99)
Potassium: 3.1 mmol/L — ABNORMAL LOW (ref 3.5–5.1)
Sodium: 132 mmol/L — ABNORMAL LOW (ref 135–145)

## 2022-11-28 LAB — TISSUE TRANSGLUTAMINASE, IGA: Tissue Transglutaminase Ab, IgA: <2 U/mL (ref 0–3)

## 2022-11-28 LAB — GLIADIN ANTIBODIES, SERUM
Antigliadin Abs, IgA: 3 units (ref 0–19)
Gliadin IgG: 2 units (ref 0–19)

## 2022-11-28 MED ORDER — SODIUM CHLORIDE 0.45 % IV SOLN
INTRAVENOUS | Status: AC
  Filled 2022-11-28 (×2): qty 75

## 2022-11-28 MED ORDER — POTASSIUM CHLORIDE CRYS ER 20 MEQ PO TBCR
40.0000 meq | EXTENDED_RELEASE_TABLET | Freq: Two times a day (BID) | ORAL | Status: AC
  Administered 2022-11-28: 40 meq via ORAL
  Administered 2022-11-28: 20 meq via ORAL
  Filled 2022-11-28 (×2): qty 2

## 2022-11-28 MED ORDER — POTASSIUM CHLORIDE 10 MEQ/100ML IV SOLN
10.0000 meq | INTRAVENOUS | Status: AC
  Administered 2022-11-28 – 2022-11-29 (×2): 10 meq via INTRAVENOUS
  Filled 2022-11-28 (×2): qty 100

## 2022-11-28 MED ORDER — CALCIUM CARBONATE 1250 (500 CA) MG PO TABS
1250.0000 mg | ORAL_TABLET | Freq: Three times a day (TID) | ORAL | Status: AC
  Administered 2022-11-28 – 2022-11-29 (×4): 1250 mg via ORAL
  Filled 2022-11-28 (×4): qty 1

## 2022-11-28 MED ORDER — MELATONIN 5 MG PO TABS
5.0000 mg | ORAL_TABLET | Freq: Every day | ORAL | Status: AC
  Administered 2022-11-29: 5 mg via ORAL
  Filled 2022-11-28 (×2): qty 1

## 2022-11-28 NOTE — Progress Notes (Signed)
[facility] PROGRESS NOTE    Progress Note  Deborah Branch  ZOX:[id number] DOB: [birth year] DOA: 11/26/2022 PCP: Gabriel Earing, FNP     Brief Narrative:   Deborah Branch is an 88 y.o. female past medical history significant for mild asthma, essential hypertension, chronic diastolic heart failure comes in with diarrhea that started 7 days prior to admission she relates they are uncontrollable has been followed by GI last seen August 2023 suspect functional diarrhea versus microscopic colitis was supposed to try low-dose Colestid she has been noncompliant with as she relates it did not help Imodium did help last colonoscopy 2014 showed polyps   Assessment/Plan:   Acute renal failure (ARF) (HCC) With a baseline creatinine of around 1-1.2, on admission 3.4. Likely prerenal azotemia, and the setting of acute C. difficile colitis diarrhea, Lasix ACE inhibitor's. Will give IV fluids for today's creatinine is improving nicely. Metabolic panel tomorrow morning.  Acute on chronic diarrhea likely due to C. difficile colitis: Continues to have watery diarrhea leukocytosis improved. Continue Dificid. Monitor bowel movements.  Still having watery diarrhea multiple bowel movements more than 5-day  Metabolic acidosis, increased anion gap/normal anion gap metabolic acidosis No continue IV fluids with bicarbonate supplements just for today. Repeat basic metabolic panel in the morning.  Leukocytosis Now resolved.  Chronic diastolic heart failure (HCC) Appears euvolemic on exam. Continue IV fluids for 12 hours. Continue to hold ACE inhibitor and Lasix.  Moderate persistent asthma without complication Continue Advair and DuoNebs.  Essential hypertension: Continue Norvasc, hold lisinopril and Lasix.  GERD: Continue PPI.  Hyperlipidemia: Continue Zocor.   DVT prophylaxis: lovenox Family Communication:none Status is: Observation The patient remains OBS appropriate and will d/c  before 2 midnights.    Code Status:     Code Status Orders  (From admission, onward)           Start     Ordered   11/26/22 1934  Full code  Continuous       Question:  By:  Answer:  Consent: discussion documented in EHR   11/26/22 1934           Code Status History     Date Active Date Inactive Code Status Order ID Comments User Context   12/08/2021 0042 12/09/2021 1825 Full Code [id number]  Frankey Shown, DO ED         IV Access:   Peripheral IV   Procedures and diagnostic studies:   No results found.   Medical Consultants:   None.   Subjective:    Deborah Branch appetite continues to have more than 5 watery bowel movements a day  Objective:    Vitals:   11/27/22 2001 11/27/22 2104 11/28/22 0500 11/28/22 0537  BP:  (!) 115/52  (!) 121/49  Pulse:  77  81  Resp:  18  18  Temp:  99.3 F (37.4 C)  98.6 F (37 C)  TempSrc:  Oral  Oral  SpO2: 92% 94%  95%  Weight:   71 kg   Height:       SpO2: 95 %   Intake/Output Summary (Last 24 hours) at 11/28/2022 0853 Last data filed at 11/28/2022 0600 Gross per 24 hour  Intake 2116.87 ml  Output --  Net 2116.87 ml    Filed Weights   11/26/22 1750 11/27/22 0500 11/28/22 0500  Weight: 69.5 kg 70.4 kg 71 kg    Exam: General exam: In no acute distress. Respiratory system: Good air movement and clear  to auscultation. Cardiovascular system: S1 & S2 heard, RRR. No JVD. Gastrointestinal system: Abdomen is nondistended, soft and nontender.  Extremities: No pedal edema. Skin: No rashes, lesions or ulcers Psychiatry: Judgement and insight appear normal. Mood & affect appropriate. Data Reviewed:    Labs: Basic Metabolic Panel: Recent Labs  Lab 11/26/22 1449 11/26/22 1533 11/27/22 0500 11/28/22 0510  NA 133*  --  130* 132*  K 3.9  --  3.6 3.1*  CL 97*  --  104 104  CO2 18*  --  14* 20*  GLUCOSE 117*  --  107* 110*  BUN 43*  --  49* 58*  CREATININE 3.40*  --  3.07* 1.81*  CALCIUM 7.8*  --   6.6* 6.1*  MG  --  1.9  --   --     GFR Estimated Creatinine Clearance: 19.6 mL/min (A) (by C-G formula based on SCr of 1.81 mg/dL (H)). Liver Function Tests: Recent Labs  Lab 11/26/22 1449  AST 43*  ALT 19  ALKPHOS 36*  BILITOT 0.8  PROT 6.6  ALBUMIN 3.8    Recent Labs  Lab 11/26/22 1449  LIPASE 10*    No results for input(s): "AMMONIA" in the last 168 hours. Coagulation profile No results for input(s): "INR", "PROTIME" in the last 168 hours. COVID-19 Labs  Recent Labs    11/26/22 12-06-2022  CRP 19.4*     Lab Results  Component Value Date   SARSCOV2NAA NEGATIVE 12/07/2021    CBC: Recent Labs  Lab 11/26/22 1449 11/27/22 0500 11/28/22 0510  WBC 14.7* 12.6* 8.7  HGB 13.5 11.9* 11.0*  HCT 39.7 36.9 32.8*  MCV 88.2 91.8 88.6  PLT 233 196 191    Cardiac Enzymes: No results for input(s): "CKTOTAL", "CKMB", "CKMBINDEX", "TROPONINI" in the last 168 hours. BNP (last 3 results) No results for input(s): "PROBNP" in the last 8760 hours. CBG: No results for input(s): "GLUCAP" in the last 168 hours. D-Dimer: No results for input(s): "DDIMER" in the last 72 hours. Hgb A1c: No results for input(s): "HGBA1C" in the last 72 hours. Lipid Profile: No results for input(s): "CHOL", "HDL", "LDLCALC", "TRIG", "CHOLHDL", "LDLDIRECT" in the last 72 hours. Thyroid function studies: Recent Labs    11/26/22 12/06/22  TSH 2.873    Anemia work up: No results for input(s): "VITAMINB12", "FOLATE", "FERRITIN", "TIBC", "IRON", "RETICCTPCT" in the last 72 hours. Sepsis Labs: Recent Labs  Lab 11/26/22 1449 11/27/22 0500 11/28/22 0510  WBC 14.7* 12.6* 8.7    Microbiology Recent Results (from the past 240 hour(s))  Gastrointestinal Panel by PCR , Stool     Status: None   Collection Time: 11/27/22  2:24 AM  Result Value Ref Range Status   Campylobacter species NOT DETECTED NOT DETECTED Final   Plesimonas shigelloides NOT DETECTED NOT DETECTED Final   Salmonella species NOT  DETECTED NOT DETECTED Final   Yersinia enterocolitica NOT DETECTED NOT DETECTED Final   Vibrio species NOT DETECTED NOT DETECTED Final   Vibrio cholerae NOT DETECTED NOT DETECTED Final   Enteroaggregative E coli (EAEC) NOT DETECTED NOT DETECTED Final   Enteropathogenic E coli (EPEC) NOT DETECTED NOT DETECTED Final   Enterotoxigenic E coli (ETEC) NOT DETECTED NOT DETECTED Final   Shiga like toxin producing E coli (STEC) NOT DETECTED NOT DETECTED Final   Shigella/Enteroinvasive E coli (EIEC) NOT DETECTED NOT DETECTED Final   Cryptosporidium NOT DETECTED NOT DETECTED Final   Cyclospora cayetanensis NOT DETECTED NOT DETECTED Final   Entamoeba histolytica NOT DETECTED NOT DETECTED  Final   Giardia lamblia NOT DETECTED NOT DETECTED Final   Adenovirus F40/41 NOT DETECTED NOT DETECTED Final   Astrovirus NOT DETECTED NOT DETECTED Final   Norovirus GI/GII NOT DETECTED NOT DETECTED Final   Rotavirus A NOT DETECTED NOT DETECTED Final   Sapovirus (I, II, IV, and V) NOT DETECTED NOT DETECTED Final    Comment: Performed at [facility], [address] [place], [place] [address]  C Difficile Quick Screen w PCR reflex     Status: Abnormal   Collection Time: 11/27/22  2:25 AM  Result Value Ref Range Status   C Diff antigen POSITIVE (A) NEGATIVE Final   C Diff toxin POSITIVE (A) NEGATIVE Final   C Diff interpretation Toxin producing C. difficile detected.  Final    Comment: CRITICAL RESULT CALLED TO, READ BACK BY AND VERIFIED WITH: RIMANDO,J AT 0319 ON 11/27/22 BY LUZOLOP Performed at [facility], [address] [place], [place] [address]      Medications:    fidaxomicin  200 mg Oral BID   heparin  5,000 Units Subcutaneous Q8H   mometasone-formoterol  2 puff Inhalation BID   pantoprazole  40 mg Oral Daily   Continuous Infusions:    LOS: 1 day   Marinda Elk  [facility]  11/28/2022, 8:53 AM

## 2022-11-28 NOTE — Progress Notes (Signed)
   11/28/22 0615  Provider Notification  Provider Name/Title Chinita Greenland, NP  Date Provider Notified 11/28/22  Time Provider Notified [phone]  Method of Notification Page  Notification Reason Critical Result  Test performed and critical result Calcium = 6.1  Date Critical Result Received 11/28/22  Time Critical Result Received 0611  Provider response See new orders  Date of Provider Response 11/28/22  Time of Provider Response [phone]

## 2022-11-29 DIAGNOSIS — A0472 Enterocolitis due to Clostridium difficile, not specified as recurrent: Secondary | ICD-10-CM | POA: Diagnosis not present

## 2022-11-29 DIAGNOSIS — N179 Acute kidney failure, unspecified: Secondary | ICD-10-CM | POA: Diagnosis not present

## 2022-11-29 LAB — HEPATIC FUNCTION PANEL
ALT: 22 U/L (ref 0–44)
AST: 21 U/L (ref 15–41)
Albumin: 2.5 g/dL — ABNORMAL LOW (ref 3.5–5.0)
Alkaline Phosphatase: 38 U/L (ref 38–126)
Bilirubin, Direct: 0.1 mg/dL (ref 0.0–0.2)
Indirect Bilirubin: 0.6 mg/dL (ref 0.3–0.9)
Total Bilirubin: 0.7 mg/dL (ref 0.3–1.2)
Total Protein: 5.8 g/dL — ABNORMAL LOW (ref 6.5–8.1)

## 2022-11-29 LAB — BASIC METABOLIC PANEL
Anion gap: 11 (ref 5–15)
BUN: 31 mg/dL — ABNORMAL HIGH (ref 8–23)
CO2: 22 mmol/L (ref 22–32)
Calcium: 6.5 mg/dL — ABNORMAL LOW (ref 8.9–10.3)
Chloride: 106 mmol/L (ref 98–111)
Creatinine, Ser: 0.91 mg/dL (ref 0.44–1.00)
GFR, Estimated: >60 mL/min (ref >60)
Glucose, Bld: 112 mg/dL — ABNORMAL HIGH (ref 70–99)
Potassium: 3.7 mmol/L (ref 3.5–5.1)
Sodium: 139 mmol/L (ref 135–145)

## 2022-11-29 LAB — MAGNESIUM: Magnesium: 1.9 mg/dL (ref 1.7–2.4)

## 2022-11-29 LAB — RETICULIN ANTIBODIES, IGA W TITER: Reticulin Ab, IgA: NEGATIVE titer (ref <2.5)

## 2022-11-29 MED ORDER — CALCIUM GLUCONATE-NACL 2-0.675 GM/100ML-% IV SOLN
2.0000 g | Freq: Once | INTRAVENOUS | Status: AC
  Administered 2022-11-29: 2000 mg via INTRAVENOUS
  Filled 2022-11-29: qty 100

## 2022-11-29 NOTE — Hospital Course (Addendum)
Deborah Branch is an 88 yo female with PMH HTN, chronic diastolic CHF, asthma who presented to the hospital with severe diarrhea for approximately 1 week prior to admission.  Last colonoscopy 2014 only noting moderate diverticulosis. Further workup on admission revealed AKI.  She was admitted for stool testing and further workup.  C. difficile returned positive and she was started on fidaxomicin.  Renal function improved on IV fluids. She was able to ambulate independently and tolerated adequate nutrition.  Diarrhea was showing mild signs of improvement at discharge and patient felt comfortable returning home with ongoing course of fidaxomicin to complete.

## 2022-11-29 NOTE — Progress Notes (Signed)
Progress Note    Deborah Branch   ZOX:[id number]  DOB: 21-Jan-1935  DOA: 11/26/2022     2 PCP: Gabriel Earing, FNP  Initial CC: diarrhea  Hospital Course: Deborah Branch is an 88 yo female with PMH HTN, chronic diastolic CHF, asthma who presented to the hospital with severe diarrhea for approximately 1 week prior to admission.  Last colonoscopy 2014 only noting moderate diverticulosis. Further workup on admission revealed AKI.  She was admitted for stool testing and further workup.  C. difficile returned positive and she was started on fidaxomicin.  Renal function improved on IV fluids.  Interval History:  Patient still having copious diarrhea, last movement liquid this morning.  Denies any abdominal pain.  No vomiting but still not taking in much nutrition.  She was strongly encouraged to continue trying. Updated son during rounds this morning.  Assessment and Plan: * C. difficile diarrhea - Does have some chronic history of intermittent diarrhea - Presented with worsening diarrhea for approximately 1 week - C. difficile positive on admission (toxin positive, antigen positive ( - Continue fidaxomicin -Continue hospitalization until diarrhea starts to improve and tolerating better oral intake  AKI (acute kidney injury) (HCC)-resolved as of 11/29/2022 - baseline creatinine ~ 0.9 - patient presents with increase in creat >0.3 mg/dL above baseline, creat increase >1.5x baseline presumed to have occurred within past 7 days PTA -Presumed prerenal/volume depletion from significant GI losses - normalized with IVF  Metabolic acidosis, increased anion gap-resolved as of 11/29/2022 Likely due to diarrhea/poor PO intake/AKI - resolved with IVF  Leukocytosis-resolved as of 11/29/2022 - in setting of Cdiff - now normalized   Chronic diastolic heart failure (HCC) Echo in 11/2021: normal EF with grade 1 DD  -Clinically hypovolemic on admission - Tolerated fluids  Moderate persistent asthma without  complication No signs/symptoms of exacerbation Continue advair Duoneb BID Receives xolair Ernest q 3 months   Primary hypertension Continue amlodipine 5mg  daily Hold lisinopril and lasix in setting of AKI  GERD (gastroesophageal reflux disease) Continue PPI   Mixed hyperlipidemia - Resume Zocor at discharge  Hypocalcemia - Replete as needed   Old records reviewed in assessment of this patient  Antimicrobials: Fidaxomicin 11/27/2022 >> current  DVT prophylaxis:  heparin injection 5,000 Units Start: 11/26/22 2200   Code Status:   Code Status: Full Code  Mobility Assessment (last 72 hours)     Mobility Assessment     Row Name 11/29/22 1353 11/29/22 1021 11/28/22 0845 11/28/22 0110 11/27/22 1111   Does patient have an order for bedrest or is patient medically unstable -- No - Continue assessment No - Continue assessment No - Continue assessment No - Continue assessment   What is the highest level of mobility based on the progressive mobility assessment? Level 5 (Walks with assist in room/hall) - Balance while stepping forward/back and can walk in room with assist - Complete Level 4 (Walks with assist in room) - Balance while marching in place and cannot step forward and back - Complete Level 4 (Walks with assist in room) - Balance while marching in place and cannot step forward and back - Complete Level 5 (Walks with assist in room/hall) - Balance while stepping forward/back and can walk in room with assist - Complete Level 5 (Walks with assist in room/hall) - Balance while stepping forward/back and can walk in room with assist - Complete    Row Name 11/26/22 1947 11/26/22 1756         Does patient have  an order for bedrest or is patient medically unstable No - Continue assessment No - Continue assessment      What is the highest level of mobility based on the progressive mobility assessment? Level 5 (Walks with assist in room/hall) - Balance while stepping forward/back and can walk  in room with assist - Complete Level 5 (Walks with assist in room/hall) - Balance while stepping forward/back and can walk in room with assist - Complete               Barriers to discharge: None Disposition Plan: Home Status is: Inpatient  Objective: Blood pressure (!) 140/58, pulse 79, temperature 98.9 F (37.2 C), temperature source Oral, resp. rate 18, height 5' (1.524 m), weight 71 kg, SpO2 96 %.  Examination:  Physical Exam Constitutional:      General: She is not in acute distress.    Appearance: Normal appearance.  HENT:     Head: Normocephalic and atraumatic.     Mouth/Throat:     Mouth: Mucous membranes are moist.  Eyes:     Extraocular Movements: Extraocular movements intact.  Cardiovascular:     Rate and Rhythm: Normal rate and regular rhythm.  Pulmonary:     Effort: Pulmonary effort is normal.     Breath sounds: Normal breath sounds.  Abdominal:     General: Bowel sounds are normal. There is no distension.     Palpations: Abdomen is soft.     Tenderness: There is no abdominal tenderness.  Musculoskeletal:        General: No swelling. Normal range of motion.     Cervical back: Normal range of motion and neck supple.  Skin:    General: Skin is warm and dry.  Neurological:     General: No focal deficit present.     Mental Status: She is alert.  Psychiatric:        Mood and Affect: Mood normal.        Behavior: Behavior normal.      Consultants:    Procedures:    Data Reviewed: Results for orders placed or performed during the hospital encounter of 11/26/22 (from the past 24 hour(s))  Basic metabolic panel     Status: Abnormal   Collection Time: 11/29/22  4:58 AM  Result Value Ref Range   Sodium 139 135 - 145 mmol/L   Potassium 3.7 3.5 - 5.1 mmol/L   Chloride 106 98 - 111 mmol/L   CO2 22 22 - 32 mmol/L   Glucose, Bld 112 (H) 70 - 99 mg/dL   BUN 31 (H) 8 - 23 mg/dL   Creatinine, Ser 1.61 0.44 - 1.00 mg/dL   Calcium 6.5 (L) 8.9 - 10.3 mg/dL    GFR, Estimated >09 >60 mL/min   Anion gap 11 5 - 15  Magnesium     Status: None   Collection Time: 11/29/22  4:58 AM  Result Value Ref Range   Magnesium 1.9 1.7 - 2.4 mg/dL  Hepatic function panel     Status: Abnormal   Collection Time: 11/29/22  4:58 AM  Result Value Ref Range   Total Protein 5.8 (L) 6.5 - 8.1 g/dL   Albumin 2.5 (L) 3.5 - 5.0 g/dL   AST 21 15 - 41 U/L   ALT 22 0 - 44 U/L   Alkaline Phosphatase 38 38 - 126 U/L   Total Bilirubin 0.7 0.3 - 1.2 mg/dL   Bilirubin, Direct 0.1 0.0 - 0.2 mg/dL   Indirect Bilirubin 0.6  0.3 - 0.9 mg/dL    I have reviewed pertinent nursing notes, vitals, labs, and images as necessary. I have ordered labwork to follow up on as indicated.  I have reviewed the last notes from staff over past 24 hours. I have discussed patient's care plan and test results with nursing staff, CM/SW, and other staff as appropriate.  Time spent: Greater than 50% of the 55 minute visit was spent in counseling/coordination of care for the patient as laid out in the A&P.   LOS: 2 days   Lewie Chamber, MD [facility] 11/29/2022, 4:58 PM

## 2022-11-29 NOTE — Plan of Care (Signed)
  Problem: Clinical Measurements: Goal: Respiratory complications will improve Outcome: Progressing   Problem: Activity: Goal: Risk for activity intolerance will decrease Outcome: Progressing   Problem: Coping: Goal: Level of anxiety will decrease Outcome: Progressing   Problem: Pain Managment: Goal: General experience of comfort will improve Outcome: Progressing   

## 2022-11-29 NOTE — Evaluation (Signed)
Physical Therapy Evaluation Patient Details Name: Deborah Branch MRN: [id number] DOB: Dec 06, 1934 Today's Date: 11/29/2022  History of Present Illness  88 yo female admitted with Cdiff. Hx of asthma, CHF, osteoporosis  Clinical Impression  On eval, pt was Min guard A for mobility. She walked ~20 feet around the room after pivoting over to bsc. Pt tolerated activity well. Assisted pt back to bed to rest. Will plan to follow and progress activity as tolerated.        Recommendations for follow up therapy are one component of a multi-disciplinary discharge planning process, led by the attending physician.  Recommendations may be updated based on patient status, additional functional criteria and insurance authorization.  Follow Up Recommendations       Assistance Recommended at Discharge PRN  Patient can return home with the following       Equipment Recommendations None recommended by PT  Recommendations for Other Services       Functional Status Assessment Patient has had a recent decline in their functional status and demonstrates the ability to make significant improvements in function in a reasonable and predictable amount of time.     Precautions / Restrictions Precautions Precaution Comments: diarrhea Restrictions Weight Bearing Restrictions: No      Mobility  Bed Mobility Overal bed mobility: Modified Independent             General bed mobility comments: Increased time.    Transfers Overall transfer level: Needs assistance   Transfers: Sit to/from Stand, Bed to chair/wheelchair/BSC Sit to Stand: Supervision Stand pivot transfers: Supervision         General transfer comment: Supv for safety. Stand pivot to bsc    Ambulation/Gait Ambulation/Gait assistance: Min guard Gait Distance (Feet): 20 Feet Assistive device: None Gait Pattern/deviations: Decreased stride length       General Gait Details: Min guard for safety. Mildly unsteady but no  LOB.  Stairs            Wheelchair Mobility    Modified Rankin (Stroke Patients Only)       Balance Overall balance assessment: Mild deficits observed, not formally tested                                           Pertinent Vitals/Pain Pain Assessment Pain Assessment: No/denies pain    Home Living Family/patient expects to be discharged to:: Private residence Living Arrangements: Alone   Type of Home: Apartment Home Access: Level entry       Home Layout: Able to live on main level with bedroom/bathroom Home Equipment: None      Prior Function Prior Level of Function : Independent/Modified Independent                     Hand Dominance        Extremity/Trunk Assessment   Upper Extremity Assessment Upper Extremity Assessment: Overall WFL for tasks assessed    Lower Extremity Assessment Lower Extremity Assessment: Generalized weakness    Cervical / Trunk Assessment Cervical / Trunk Assessment: Normal  Communication   Communication: HOH  Cognition Arousal/Alertness: Awake/alert Behavior During Therapy: WFL for tasks assessed/performed Overall Cognitive Status: Within Functional Limits for tasks assessed  General Comments: HOH?        General Comments      Exercises     Assessment/Plan    PT Assessment Patient needs continued PT services  PT Problem List Decreased strength;Decreased activity tolerance;Decreased balance;Decreased mobility       PT Treatment Interventions Gait training;Therapeutic exercise;Balance training;Functional mobility training;Therapeutic activities;Patient/family education    PT Goals (Current goals can be found in the Care Plan section)  Acute Rehab PT Goals Patient Stated Goal: to get better PT Goal Formulation: With patient Time For Goal Achievement: 12/13/22 Potential to Achieve Goals: Good    Frequency Min 1X/week      Co-evaluation               AM-PAC PT "6 Clicks" Mobility  Outcome Measure Help needed turning from your back to your side while in a flat bed without using bedrails?: None Help needed moving from lying on your back to sitting on the side of a flat bed without using bedrails?: None Help needed moving to and from a bed to a chair (including a wheelchair)?: None Help needed standing up from a chair using your arms (e.g., wheelchair or bedside chair)?: None Help needed to walk in hospital room?: A Little Help needed climbing 3-5 steps with a railing? : A Little 6 Click Score: 22    End of Session   Activity Tolerance: Patient tolerated treatment well Patient left: in bed;with call bell/phone within reach;with bed alarm set   PT Visit Diagnosis: Muscle weakness (generalized) (M62.81)    Time: 8119-1478 PT Time Calculation (min) (ACUTE ONLY): 18 min   Charges:   PT Evaluation $PT Eval Low Complexity: 1 Low             Faye Ramsay, PT Acute Rehabilitation  Office: [phone]

## 2022-11-29 NOTE — Assessment & Plan Note (Signed)
-   Replete as needed 

## 2022-11-30 ENCOUNTER — Telehealth: Payer: Self-pay | Admitting: Nurse Practitioner

## 2022-11-30 DIAGNOSIS — A0472 Enterocolitis due to Clostridium difficile, not specified as recurrent: Secondary | ICD-10-CM | POA: Diagnosis not present

## 2022-11-30 DIAGNOSIS — N179 Acute kidney failure, unspecified: Secondary | ICD-10-CM | POA: Diagnosis not present

## 2022-11-30 LAB — CBC WITH DIFFERENTIAL/PLATELET
Abs Immature Granulocytes: 0.37 10*3/uL — ABNORMAL HIGH (ref 0.00–0.07)
Basophils Absolute: 0.2 10*3/uL — ABNORMAL HIGH (ref 0.0–0.1)
Basophils Relative: 2 %
Eosinophils Absolute: 0.3 10*3/uL (ref 0.0–0.5)
Eosinophils Relative: 3 %
HCT: 40.8 % (ref 36.0–46.0)
Hemoglobin: 13.2 g/dL (ref 12.0–15.0)
Immature Granulocytes: 4 %
Lymphocytes Relative: 24 %
Lymphs Abs: 2.1 10*3/uL (ref 0.7–4.0)
MCH: 29.5 pg (ref 26.0–34.0)
MCHC: 32.4 g/dL (ref 30.0–36.0)
MCV: 91.3 fL (ref 80.0–100.0)
Monocytes Absolute: 1.2 10*3/uL — ABNORMAL HIGH (ref 0.1–1.0)
Monocytes Relative: 13 %
Neutro Abs: 4.7 10*3/uL (ref 1.7–7.7)
Neutrophils Relative %: 54 %
Platelets: 304 10*3/uL (ref 150–400)
RBC: 4.47 MIL/uL (ref 3.87–5.11)
RDW: 13.7 % (ref 11.5–15.5)
WBC: 8.8 10*3/uL (ref 4.0–10.5)
nRBC: 0 % (ref 0.0–0.2)

## 2022-11-30 LAB — BASIC METABOLIC PANEL
Anion gap: 10 (ref 5–15)
BUN: 19 mg/dL (ref 8–23)
CO2: 22 mmol/L (ref 22–32)
Calcium: 7.9 mg/dL — ABNORMAL LOW (ref 8.9–10.3)
Chloride: 104 mmol/L (ref 98–111)
Creatinine, Ser: 0.77 mg/dL (ref 0.44–1.00)
GFR, Estimated: >60 mL/min (ref >60)
Glucose, Bld: 117 mg/dL — ABNORMAL HIGH (ref 70–99)
Potassium: 3.5 mmol/L (ref 3.5–5.1)
Sodium: 136 mmol/L (ref 135–145)

## 2022-11-30 LAB — MAGNESIUM: Magnesium: 2.1 mg/dL (ref 1.7–2.4)

## 2022-11-30 NOTE — Telephone Encounter (Signed)
Patient's son is calling wanting to schedule an appointment for patient asap for when she gets discharged from the hospital, currently at [facility]. Says it cannot wait until August which is the next available on my schedule. Please advise

## 2022-11-30 NOTE — Progress Notes (Signed)
Physical Therapy Treatment Patient Details Name: Deborah Branch MRN: [id number] DOB: [birth year] Today's Date: 11/30/2022   History of Present Illness 88 yo female admitted with Cdiff. Hx of asthma, CHF, osteoporosis    PT Comments    Pt seen for PT tx with pt agreeable, received in bathroom & completed toileting task & grooming tasks standing at sink without physical assistance from PT. Pt is able to ambulate increased distances without AD but with impaired gait pattern as noted below. Pt engaged in balance retraining tasks. No overt LOB noted throughout session.    Recommendations for follow up therapy are one component of a multi-disciplinary discharge planning process, led by the attending physician.  Recommendations may be updated based on patient status, additional functional criteria and insurance authorization.  Follow Up Recommendations       Assistance Recommended at Discharge PRN  Patient can return home with the following Assistance with cooking/housework   Equipment Recommendations  None recommended by PT    Recommendations for Other Services       Precautions / Restrictions Precautions Precautions: None Restrictions Weight Bearing Restrictions: No     Mobility  Bed Mobility               General bed mobility comments: not tested, pt received in bathroom, left sitting in recliner    Transfers Overall transfer level: Independent   Transfers: Sit to/from Stand Sit to Stand: Independent           General transfer comment: STS from toilet, STS from recliner    Ambulation/Gait Ambulation/Gait assistance: Supervision, Min guard Gait Distance (Feet): 170 Feet Assistive device: None Gait Pattern/deviations: Decreased stride length, Decreased dorsiflexion - right, Decreased dorsiflexion - left, Decreased step length - right, Decreased step length - left Gait velocity: decreased     General Gait Details: decreased BLE hip/knee flexion during swing  phase, decreased heel strike BLE, appears to be "waddlingAnimator    Modified Rankin (Stroke Patients Only)       Balance Overall balance assessment: Needs assistance Sitting-balance support: Feet supported Sitting balance-Leahy Scale: Good     Standing balance support: No upper extremity supported, During functional activity Standing balance-Leahy Scale: Fair                              Cognition Arousal/Alertness: Awake/alert Behavior During Therapy: WFL for tasks assessed/performed Overall Cognitive Status: Within Functional Limits for tasks assessed                                          Exercises Other Exercises Other Exercises: Pt engaged in retrograde gait with CGA for high level balance training. Other Exercises: Pt performed 10x STS without BUE support for BLE strengthening & endurance training. Other Exercises: Pt performed single leg stance with eyes open, narrow BOS with eyes closed for balance training without LOB, CGA from PT.    General Comments        Pertinent Vitals/Pain Pain Assessment Pain Assessment: No/denies pain    Home Living Family/patient expects to be discharged to:: Private residence Living Arrangements: Alone Available Help at Discharge: Family;Available PRN/intermittently (Family to stay all day once home) Type of Home: Apartment Home Access: Level entry  Home Layout: Able to live on main level with bedroom/bathroom Home Equipment: Rollator (4 wheels);BSC/3in1;Grab bars - tub/shower      Prior Function            PT Goals (current goals can now be found in the care plan section) Acute Rehab PT Goals Patient Stated Goal: to get better PT Goal Formulation: With patient Time For Goal Achievement: 12/13/22 Potential to Achieve Goals: Good Progress towards PT goals: Progressing toward goals    Frequency    Min 1X/week      PT Plan Current  plan remains appropriate    Co-evaluation              AM-PAC PT "6 Clicks" Mobility   Outcome Measure  Help needed turning from your back to your side while in a flat bed without using bedrails?: None Help needed moving from lying on your back to sitting on the side of a flat bed without using bedrails?: None Help needed moving to and from a bed to a chair (including a wheelchair)?: None Help needed standing up from a chair using your arms (e.g., wheelchair or bedside chair)?: None Help needed to walk in hospital room?: A Little Help needed climbing 3-5 steps with a railing? : A Little 6 Click Score: 22    End of Session   Activity Tolerance: Patient tolerated treatment well Patient left: in chair;with call bell/phone within reach   PT Visit Diagnosis: Muscle weakness (generalized) (M62.81);Unsteadiness on feet (R26.81)     Time: 1610-9604 PT Time Calculation (min) (ACUTE ONLY): 19 min  Charges:  $Therapeutic Activity: 8-22 mins                     Aleda Grana, PT, DPT 11/30/22, 1:07 PM   Sandi Mariscal 11/30/2022, 1:06 PM

## 2022-11-30 NOTE — Care Management Important Message (Signed)
Important Message  Patient Details IM Letter given. Name: Deborah Branch MRN: [id number] Date of Birth: 1935/06/24   Medicare Important Message Given:  Yes     Caren Macadam 11/30/2022, 9:31 AM

## 2022-11-30 NOTE — Progress Notes (Signed)
Progress Note    Deborah Branch   WUJ:[id number]  DOB: [birth year]  DOA: 11/26/2022     3 PCP: Gabriel Earing, FNP  Initial CC: diarrhea  Hospital Course: Ms. Swigart is an 88 yo female with PMH HTN, chronic diastolic CHF, asthma who presented to the hospital with severe diarrhea for approximately 1 week prior to admission.  Last colonoscopy 2014 only noting moderate diverticulosis. Further workup on admission revealed AKI.  She was admitted for stool testing and further workup.  C. difficile returned positive and she was started on fidaxomicin.  Renal function improved on IV fluids.  Interval History:  No events overnight.  Still having liquid diarrhea when seen this morning.  Son present bedside as well and updated.  She denies any abdominal pain nor any nausea/vomiting.  At this time, we are awaiting further improvement in her diarrhea prior to discharging home.  Assessment and Plan: * C. difficile diarrhea - Does have some chronic history of intermittent diarrhea - Presented with worsening diarrhea for approximately 1 week - C. difficile positive on admission (toxin positive, antigen positive) - Continue fidaxomicin -Continue hospitalization until diarrhea starts to improve and tolerating better oral intake  AKI (acute kidney injury) (HCC)-resolved as of 11/29/2022 - baseline creatinine ~ 0.9 - patient presents with increase in creat >0.3 mg/dL above baseline, creat increase >1.5x baseline presumed to have occurred within past 7 days PTA -Presumed prerenal/volume depletion from significant GI losses - normalized with IVF  Metabolic acidosis, increased anion gap-resolved as of 11/29/2022 Likely due to diarrhea/poor PO intake/AKI - resolved with IVF  Leukocytosis-resolved as of 11/29/2022 - in setting of Cdiff - now normalized   Chronic diastolic heart failure (HCC) Echo in 11/2021: normal EF with grade 1 DD  -Clinically hypovolemic on admission - Tolerated fluids  Moderate  persistent asthma without complication No signs/symptoms of exacerbation Continue advair Duoneb BID Receives xolair [place] q 3 months   Primary hypertension Continue amlodipine 5mg  daily Hold lisinopril and lasix in setting of AKI  GERD (gastroesophageal reflux disease) Continue PPI   Mixed hyperlipidemia - Resume Zocor at discharge  Hypocalcemia - Replete as needed   Old records reviewed in assessment of this patient  Antimicrobials: Fidaxomicin 11/27/2022 >> current  DVT prophylaxis:  heparin injection 5,000 Units Start: 11/26/22 2200   Code Status:   Code Status: Full Code  Mobility Assessment (last 72 hours)     Mobility Assessment     Row Name 11/30/22 1305 11/30/22 0945 11/29/22 1923 11/29/22 1353 11/29/22 1021   Does patient have an order for bedrest or is patient medically unstable -- -- No - Continue assessment -- No - Continue assessment   What is the highest level of mobility based on the progressive mobility assessment? Level 5 (Walks with assist in room/hall) - Balance while stepping forward/back and can walk in room with assist - Complete Level 5 (Walks with assist in room/hall) - Balance while stepping forward/back and can walk in room with assist - Complete Level 5 (Walks with assist in room/hall) - Balance while stepping forward/back and can walk in room with assist - Complete Level 5 (Walks with assist in room/hall) - Balance while stepping forward/back and can walk in room with assist - Complete Level 4 (Walks with assist in room) - Balance while marching in place and cannot step forward and back - Complete    Row Name 11/28/22 0845 11/28/22 0110         Does patient have an  order for bedrest or is patient medically unstable No - Continue assessment No - Continue assessment      What is the highest level of mobility based on the progressive mobility assessment? Level 4 (Walks with assist in room) - Balance while marching in place and cannot step forward and  back - Complete Level 5 (Walks with assist in room/hall) - Balance while stepping forward/back and can walk in room with assist - Complete               Barriers to discharge: None Disposition Plan: Home Status is: Inpatient  Objective: Blood pressure (!) 140/68, pulse 79, temperature 97.8 F (36.6 C), temperature source Oral, resp. rate 14, height 5' (1.524 m), weight 69.8 kg, SpO2 96 %.  Examination:  Physical Exam Constitutional:      General: She is not in acute distress.    Appearance: Normal appearance.  HENT:     Head: Normocephalic and atraumatic.     Mouth/Throat:     Mouth: Mucous membranes are moist.  Eyes:     Extraocular Movements: Extraocular movements intact.  Cardiovascular:     Rate and Rhythm: Normal rate and regular rhythm.  Pulmonary:     Effort: Pulmonary effort is normal.     Breath sounds: Normal breath sounds.  Abdominal:     General: Bowel sounds are normal. There is no distension.     Palpations: Abdomen is soft.     Tenderness: There is no abdominal tenderness.  Musculoskeletal:        General: No swelling. Normal range of motion.     Cervical back: Normal range of motion and neck supple.  Skin:    General: Skin is warm and dry.  Neurological:     General: No focal deficit present.     Mental Status: She is alert.  Psychiatric:        Mood and Affect: Mood normal.        Behavior: Behavior normal.      Consultants:    Procedures:    Data Reviewed: Results for orders placed or performed during the hospital encounter of 11/26/22 (from the past 24 hour(s))  Basic metabolic panel     Status: Abnormal   Collection Time: 11/30/22  5:58 AM  Result Value Ref Range   Sodium 136 135 - 145 mmol/L   Potassium 3.5 3.5 - 5.1 mmol/L   Chloride 104 98 - 111 mmol/L   CO2 22 22 - 32 mmol/L   Glucose, Bld 117 (H) 70 - 99 mg/dL   BUN 19 8 - 23 mg/dL   Creatinine, Ser 1.61 0.44 - 1.00 mg/dL   Calcium 7.9 (L) 8.9 - 10.3 mg/dL   GFR, Estimated  >09 >60 mL/min   Anion gap 10 5 - 15  CBC with Differential/Platelet     Status: Abnormal   Collection Time: 11/30/22  5:58 AM  Result Value Ref Range   WBC 8.8 4.0 - 10.5 K/uL   RBC 4.47 3.87 - 5.11 MIL/uL   Hemoglobin 13.2 12.0 - 15.0 g/dL   HCT 45.4 09.8 - 11.9 %   MCV 91.3 80.0 - 100.0 fL   MCH 29.5 26.0 - 34.0 pg   MCHC 32.4 30.0 - 36.0 g/dL   RDW 14.7 82.9 - 56.2 %   Platelets 304 150 - 400 K/uL   nRBC 0.0 0.0 - 0.2 %   Neutrophils Relative % 54 %   Neutro Abs 4.7 1.7 - 7.7 K/uL  Lymphocytes Relative 24 %   Lymphs Abs 2.1 0.7 - 4.0 K/uL   Monocytes Relative 13 %   Monocytes Absolute 1.2 (H) 0.1 - 1.0 K/uL   Eosinophils Relative 3 %   Eosinophils Absolute 0.3 0.0 - 0.5 K/uL   Basophils Relative 2 %   Basophils Absolute 0.2 (H) 0.0 - 0.1 K/uL   Immature Granulocytes 4 %   Abs Immature Granulocytes 0.37 (H) 0.00 - 0.07 K/uL  Magnesium     Status: None   Collection Time: 11/30/22  5:58 AM  Result Value Ref Range   Magnesium 2.1 1.7 - 2.4 mg/dL    I have reviewed pertinent nursing notes, vitals, labs, and images as necessary. I have ordered labwork to follow up on as indicated.  I have reviewed the last notes from staff over past 24 hours. I have discussed patient's care plan and test results with nursing staff, CM/SW, and other staff as appropriate.  Time spent: Greater than 50% of the 55 minute visit was spent in counseling/coordination of care for the patient as laid out in the A&P.   LOS: 3 days   Lewie Chamber, MD [facility] 11/30/2022, 5:04 PM

## 2022-11-30 NOTE — Evaluation (Signed)
Occupational Therapy Evaluation Patient Details Name: Deborah Branch MRN: [id number] DOB: 07/05/1934 Today's Date: 11/30/2022   History of Present Illness 87 yo female admitted with Cdiff. Hx of asthma, CHF, osteoporosis   Clinical Impression   Patient evaluated by Occupational Therapy with no further acute OT needs identified. All education has been completed and the patient has no further questions. See below for any follow-up Occupational Therapy or equipment needs. OT is signing off. Thank you for this referral.       Recommendations for follow up therapy are one component of a multi-disciplinary discharge planning process, led by the attending physician.  Recommendations may be updated based on patient status, additional functional criteria and insurance authorization.   Assistance Recommended at Discharge PRN  Patient can return home with the following  (General supervision once home for safety. Son reports family will provide this until pt feels safe)    Functional Status Assessment  Patient has had a recent decline in their functional status and demonstrates the ability to make significant improvements in function in a reasonable and predictable amount of time.  Equipment Recommendations  None recommended by OT    Recommendations for Other Services       Precautions / Restrictions        Mobility Bed Mobility Overal bed mobility: Modified Independent             General bed mobility comments: Increased time.    Transfers    Mod I (slower)                      Balance Overall balance assessment: Mild deficits observed, not formally tested                                         ADL either performed or assessed with clinical judgement   ADL Overall ADL's : At baseline                                       General ADL Comments: Pt able to demonstrate LE dressing, toileting, standing at sink for grooming and in-room  ambulation without AD and no LOB. PT and son confrim that while pt was very weak 2 days ago, she appears back to baseline today.     Vision Baseline Vision/History: 1 Wears glasses Ability to See in Adequate Light: 0 Adequate Patient Visual Report: No change from baseline Vision Assessment?: No apparent visual deficits     Perception     Praxis      Pertinent Vitals/Pain Pain Assessment Pain Assessment: No/denies pain     Hand Dominance     Extremity/Trunk Assessment Upper Extremity Assessment Upper Extremity Assessment: Overall WFL for tasks assessed   Lower Extremity Assessment Lower Extremity Assessment: Defer to PT evaluation   Cervical / Trunk Assessment Cervical / Trunk Assessment: Normal   Communication Communication Communication: HOH   Cognition Arousal/Alertness: Awake/alert Behavior During Therapy: WFL for tasks assessed/performed Overall Cognitive Status: Within Functional Limits for tasks assessed                                 General Comments: [facility]     General Comments       Exercises  Shoulder Instructions      Home Living Family/patient expects to be discharged to:: Private residence Living Arrangements: Alone Available Help at Discharge: Family;Available PRN/intermittently (Family to stay all day once home) Type of Home: Apartment Home Access: Level entry     Home Layout: Able to live on main level with bedroom/bathroom     Bathroom Shower/Tub: Producer, television/film/video: Handicapped height     Home Equipment: Rollator (4 wheels);BSC/3in1;Grab bars - tub/shower          Prior Functioning/Environment Prior Level of Function : Independent/Modified Independent;Driving             Mobility Comments: Son reports that family has encouraged use of a cane or walker but pt refuses. ADLs Comments: Required no assistance        OT Problem List: Decreased activity tolerance      OT  Treatment/Interventions:      OT Goals(Current goals can be found in the care plan section) Acute Rehab OT Goals OT Goal Formulation: All assessment and education complete, DC therapy  OT Frequency:      Co-evaluation              AM-PAC OT "6 Clicks" Daily Activity     Outcome Measure Help from another person eating meals?: None Help from another person taking care of personal grooming?: None Help from another person toileting, which includes using toliet, bedpan, or urinal?: None Help from another person bathing (including washing, rinsing, drying)?: None Help from another person to put on and taking off regular upper body clothing?: None Help from another person to put on and taking off regular lower body clothing?: None 6 Click Score: 24   End of Session Equipment Utilized During Treatment:  (Nothing)  Activity Tolerance: Patient tolerated treatment well Patient left: in bed;with call bell/phone within reach;with family/visitor present  OT Visit Diagnosis: Unsteadiness on feet (R26.81)                Time: 3244-0102 OT Time Calculation (min): 28 min Charges:  OT General Charges $OT Visit: 1 Visit OT Evaluation $OT Eval Low Complexity: 1 Low OT Treatments $Self Care/Home Management : 8-22 mins  Victorino Dike, OT Acute Rehab Services Office: [phone] 11/30/2022  Theodoro Clock 11/30/2022, 9:48 AM

## 2022-12-01 ENCOUNTER — Other Ambulatory Visit (HOSPITAL_COMMUNITY): Payer: Self-pay

## 2022-12-01 DIAGNOSIS — N179 Acute kidney failure, unspecified: Secondary | ICD-10-CM | POA: Diagnosis not present

## 2022-12-01 DIAGNOSIS — A0472 Enterocolitis due to Clostridium difficile, not specified as recurrent: Secondary | ICD-10-CM | POA: Diagnosis not present

## 2022-12-01 LAB — BASIC METABOLIC PANEL
Anion gap: 11 (ref 5–15)
BUN: 18 mg/dL (ref 8–23)
CO2: 24 mmol/L (ref 22–32)
Calcium: 8.1 mg/dL — ABNORMAL LOW (ref 8.9–10.3)
Chloride: 107 mmol/L (ref 98–111)
Creatinine, Ser: 0.83 mg/dL (ref 0.44–1.00)
GFR, Estimated: >60 mL/min (ref >60)
Glucose, Bld: 114 mg/dL — ABNORMAL HIGH (ref 70–99)
Potassium: 3.4 mmol/L — ABNORMAL LOW (ref 3.5–5.1)
Sodium: 142 mmol/L (ref 135–145)

## 2022-12-01 LAB — CBC WITH DIFFERENTIAL/PLATELET
Abs Immature Granulocytes: 0.5 10*3/uL — ABNORMAL HIGH (ref 0.00–0.07)
Basophils Absolute: 0.2 10*3/uL — ABNORMAL HIGH (ref 0.0–0.1)
Basophils Relative: 1 %
Eosinophils Absolute: 0.3 10*3/uL (ref 0.0–0.5)
Eosinophils Relative: 3 %
HCT: 40.5 % (ref 36.0–46.0)
Hemoglobin: 13.3 g/dL (ref 12.0–15.0)
Immature Granulocytes: 5 %
Lymphocytes Relative: 29 %
Lymphs Abs: 3.2 10*3/uL (ref 0.7–4.0)
MCH: 29.8 pg (ref 26.0–34.0)
MCHC: 32.8 g/dL (ref 30.0–36.0)
MCV: 90.6 fL (ref 80.0–100.0)
Monocytes Absolute: 1.3 10*3/uL — ABNORMAL HIGH (ref 0.1–1.0)
Monocytes Relative: 11 %
Neutro Abs: 5.6 10*3/uL (ref 1.7–7.7)
Neutrophils Relative %: 51 %
Platelets: 381 10*3/uL (ref 150–400)
RBC: 4.47 MIL/uL (ref 3.87–5.11)
RDW: 13.8 % (ref 11.5–15.5)
WBC: 11.1 10*3/uL — ABNORMAL HIGH (ref 4.0–10.5)
nRBC: 0 % (ref 0.0–0.2)

## 2022-12-01 LAB — MAGNESIUM: Magnesium: 2.3 mg/dL (ref 1.7–2.4)

## 2022-12-01 MED ORDER — FIDAXOMICIN 200 MG PO TABS
200.0000 mg | ORAL_TABLET | Freq: Two times a day (BID) | ORAL | 0 refills | Status: AC
  Filled 2022-12-01: qty 12, 6d supply, fill #0

## 2022-12-01 MED ORDER — POTASSIUM CHLORIDE CRYS ER 20 MEQ PO TBCR
40.0000 meq | EXTENDED_RELEASE_TABLET | Freq: Once | ORAL | Status: AC
  Administered 2022-12-01: 40 meq via ORAL
  Filled 2022-12-01: qty 2

## 2022-12-01 NOTE — Discharge Summary (Signed)
Physician Discharge Summary   Deborah Branch:[id number] DOB: 05-06-1935 DOA: 11/26/2022  PCP: Gabriel Earing, FNP  Admit date: 11/26/2022 Discharge date: 12/01/2022   Admitted From: Home Disposition:  Home Discharging physician: Lewie Chamber, MD Barriers to discharge: none  Recommendations at discharge: Continue medical management   Discharge Condition: stable CODE STATUS: Full Diet recommendation:  Diet Orders (From admission, onward)     Start     Ordered   12/01/22 0000  Diet - low sodium heart healthy        12/01/22 0850            Hospital Course: Deborah Branch is an 88 yo female with PMH HTN, chronic diastolic CHF, asthma who presented to the hospital with severe diarrhea for approximately 1 week prior to admission.  Last colonoscopy 2014 only noting moderate diverticulosis. Further workup on admission revealed AKI.  She was admitted for stool testing and further workup.  C. difficile returned positive and she was started on fidaxomicin.  Renal function improved on IV fluids. She was able to ambulate independently and tolerated adequate nutrition.  Diarrhea was showing mild signs of improvement at discharge and patient felt comfortable returning home with ongoing course of fidaxomicin to complete.  Assessment and Plan: * C. difficile diarrhea - Does have some chronic history of intermittent diarrhea - Presented with worsening diarrhea for approximately 1 week - C. difficile positive on admission (toxin positive, antigen positive) - Continue fidaxomicin - diarrhea slowly improving prior to d/c; patient comfortable with d/c home and able to ambulate to bathroom independently and nutrition also had improved   AKI (acute kidney injury) (HCC)-resolved as of 11/29/2022 - baseline creatinine ~ 0.9 - patient presents with increase in creat >0.3 mg/dL above baseline, creat increase >1.5x baseline presumed to have occurred within past 7 days PTA -Presumed prerenal/volume  depletion from significant GI losses - normalized with IVF  Metabolic acidosis, increased anion gap-resolved as of 11/29/2022 Likely due to diarrhea/poor PO intake/AKI - resolved with IVF  Leukocytosis-resolved as of 11/29/2022 - in setting of Cdiff - now normalized   Chronic diastolic heart failure (HCC) Echo in 11/2021: normal EF with grade 1 DD  -Clinically hypovolemic on admission - Tolerated fluids  Moderate persistent asthma without complication No signs/symptoms of exacerbation Continue advair Duoneb BID Receives xolair [place] q 3 months   Primary hypertension Continue amlodipine 5mg  daily - home regimen resumed at discharge   GERD (gastroesophageal reflux disease) Continue PPI   Mixed hyperlipidemia - Resume Zocor at discharge  Hypocalcemia - Replete as needed   The patient's chronic medical conditions were treated accordingly per the patient's home medication regimen except as noted.  On day of discharge, patient was felt deemed stable for discharge. Patient/family member advised to call PCP or come back to ER if needed.   Principal Diagnosis: C. difficile diarrhea  Discharge Diagnoses: Active Hospital Problems   Diagnosis Date Noted   C. difficile diarrhea 11/26/2022    Priority: 1.   Chronic diastolic heart failure (HCC) 09/18/2022    Priority: 4.   Moderate persistent asthma without complication 08/23/2020    Priority: 5.   Primary hypertension 12/08/2021    Priority: 6.   GERD (gastroesophageal reflux disease) 07/25/2007    Priority: 6.   Mixed hyperlipidemia 12/08/2021    Priority: 7.   Hypocalcemia 11/29/2022    Resolved Hospital Problems   Diagnosis Date Noted Date Resolved   AKI (acute kidney injury) (HCC) 11/26/2022 11/29/2022  Priority: 1.   Metabolic acidosis, increased anion gap 11/26/2022 11/29/2022    Priority: 2.   Leukocytosis 11/26/2022 11/29/2022    Priority: 3.     Discharge Instructions     Diet - low sodium heart healthy    Complete by: As directed    Increase activity slowly   Complete by: As directed       Allergies as of 12/01/2022       Reactions   Clarithromycin    REACTION: itching   Penicillins    REACTION: hives        Medication List     TAKE these medications    amLODipine 5 MG tablet Commonly known as: NORVASC TAKE 1 TABLET BY MOUTH ONCE DAILY   calcium carbonate 1500 (600 Ca) MG Tabs tablet Commonly known as: OSCAL Take 1 tablet by mouth 2 (two) times daily with a meal.   cetirizine 10 MG tablet Commonly known as: ZYRTEC Take 10 mg by mouth daily.   denosumab 60 MG/ML Sosy injection Commonly known as: PROLIA Inject 60 mg into the skin every 6 (six) months.   Dificid 200 MG Tabs tablet Generic drug: fidaxomicin Take 1 tablet (200 mg) by mouth 2 times daily for 12 doses.   fluticasone-salmeterol 100-50 MCG/ACT Aepb Commonly known as: Advair Diskus Inhale 1 puff into the lungs 2 (two) times daily.   furosemide 20 MG tablet Commonly known as: LASIX Take 20 mg by mouth.   ipratropium-albuterol 0.5-2.5 (3) MG/3ML Soln Commonly known as: DUONEB Take 3 mLs by nebulization every 6 (six) hours as needed (shortness of breath and cough).   lisinopril 40 MG tablet Commonly known as: ZESTRIL Take 1 tablet (40 mg total) by mouth daily.   omeprazole 20 MG capsule Commonly known as: PRILOSEC TAKE ONE CAPSULE BY MOUTH DAILY   simvastatin 20 MG tablet Commonly known as: ZOCOR Take 1 tablet (20 mg total) by mouth at bedtime.   XOLAIR [place] Inject 225 mg into the skin every 3 (three) months.   zinc gluconate 50 MG tablet Take 50 mg by mouth daily.        Follow-up Information     Care, [facility] Follow up.   Specialty: Home Health Services Why: Your home health has been set up with [facility].. The office will call to arrange start of service information. If you have any questions or concerns please call the number listed above. Contact information: [address] [place] [address] [phone]                Allergies  Allergen Reactions   Clarithromycin     REACTION: itching   Penicillins     REACTION: hives    Consultations:   Procedures:   Discharge Exam: BP (!) 131/48 (BP Location: Right Arm)   Pulse 74   Temp 98.7 F (37.1 C) (Oral)   Resp 18   Ht 5' (1.524 m)   Wt 70.1 kg   SpO2 95%   BMI 30.18 kg/m  Physical Exam Constitutional:      General: She is not in acute distress.    Appearance: Normal appearance.  HENT:     Head: Normocephalic and atraumatic.     Mouth/Throat:     Mouth: Mucous membranes are moist.  Eyes:     Extraocular Movements: Extraocular movements intact.  Cardiovascular:     Rate and Rhythm: Normal rate and regular rhythm.  Pulmonary:     Effort: Pulmonary effort  is normal.     Breath sounds: Normal breath sounds.  Abdominal:     General: Bowel sounds are normal. There is no distension.     Palpations: Abdomen is soft.     Tenderness: There is no abdominal tenderness.  Musculoskeletal:        General: No swelling. Normal range of motion.     Cervical back: Normal range of motion and neck supple.  Skin:    General: Skin is warm and dry.  Neurological:     General: No focal deficit present.     Mental Status: She is alert.  Psychiatric:        Mood and Affect: Mood normal.        Behavior: Behavior normal.      The results of significant diagnostics from this hospitalization (including imaging, microbiology, ancillary and laboratory) are listed below for reference.   Microbiology: Recent Results (from the past 240 hour(s))  Gastrointestinal Panel by PCR , Stool     Status: None   Collection Time: 11/27/22  2:24 AM  Result Value Ref Range Status   Campylobacter species NOT DETECTED NOT DETECTED Final   Plesimonas shigelloides NOT DETECTED NOT DETECTED Final   Salmonella species NOT DETECTED NOT DETECTED Final   Yersinia enterocolitica NOT DETECTED NOT DETECTED  Final   Vibrio species NOT DETECTED NOT DETECTED Final   Vibrio cholerae NOT DETECTED NOT DETECTED Final   Enteroaggregative E coli (EAEC) NOT DETECTED NOT DETECTED Final   Enteropathogenic E coli (EPEC) NOT DETECTED NOT DETECTED Final   Enterotoxigenic E coli (ETEC) NOT DETECTED NOT DETECTED Final   Shiga like toxin producing E coli (STEC) NOT DETECTED NOT DETECTED Final   Shigella/Enteroinvasive E coli (EIEC) NOT DETECTED NOT DETECTED Final   Cryptosporidium NOT DETECTED NOT DETECTED Final   Cyclospora cayetanensis NOT DETECTED NOT DETECTED Final   Entamoeba histolytica NOT DETECTED NOT DETECTED Final   Giardia lamblia NOT DETECTED NOT DETECTED Final   Adenovirus F40/41 NOT DETECTED NOT DETECTED Final   Astrovirus NOT DETECTED NOT DETECTED Final   Norovirus GI/GII NOT DETECTED NOT DETECTED Final   Rotavirus A NOT DETECTED NOT DETECTED Final   Sapovirus (I, II, IV, and V) NOT DETECTED NOT DETECTED Final    Comment: Performed at [facility], [address] [place], [place] [address]  C Difficile Quick Screen w PCR reflex     Status: Abnormal   Collection Time: 11/27/22  2:25 AM  Result Value Ref Range Status   C Diff antigen POSITIVE (A) NEGATIVE Final   C Diff toxin POSITIVE (A) NEGATIVE Final   C Diff interpretation Toxin producing C. difficile detected.  Final    Comment: CRITICAL RESULT CALLED TO, READ BACK BY AND VERIFIED WITH: RIMANDO,J AT 0319 ON 11/27/22 BY LUZOLOP Performed at [facility], [address] [place], [place] [address]      Labs: BNP (last 3 results) Recent Labs    12/07/21 2023  BNP 196.0*   Basic Metabolic Panel: Recent Labs  Lab 11/26/22 1533 11/27/22 0500 11/28/22 0510 11/29/22 0458 11/30/22 0558 12/01/22 0559  NA  --  130* 132* 139 136 142  K  --  3.6 3.1* 3.7 3.5 3.4*  CL  --  104 104 106 104 107  CO2  --  14* 20* 22 22 24   GLUCOSE  --  107* 110* 112* 117* 114*  BUN  --  49* 58* 31* 19 18  CREATININE  --  3.07* 1.81* 0.91 0.77 0.83  CALCIUM  --  6.6* 6.1* 6.5* 7.9* 8.1*  MG 1.9  --   --  1.9 2.1 2.3   Liver Function Tests: Recent Labs  Lab 11/26/22 1449 11/29/22 0458  AST 43* 21  ALT 19 22  ALKPHOS 36* 38  BILITOT 0.8 0.7  PROT 6.6 5.8*  ALBUMIN 3.8 2.5*   Recent Labs  Lab 11/26/22 1449  LIPASE 10*   No results for input(s): "AMMONIA" in the last 168 hours. CBC: Recent Labs  Lab 11/26/22 1449 11/27/22 0500 11/28/22 0510 11/30/22 0558 12/01/22 0559  WBC 14.7* 12.6* 8.7 8.8 11.1*  NEUTROABS  --   --   --  4.7 5.6  HGB 13.5 11.9* 11.0* 13.2 13.3  HCT 39.7 36.9 32.8* 40.8 40.5  MCV 88.2 91.8 88.6 91.3 90.6  PLT 233 196 191 304 381   Cardiac Enzymes: No results for input(s): "CKTOTAL", "CKMB", "CKMBINDEX", "TROPONINI" in the last 168 hours. BNP: Invalid input(s): "POCBNP" CBG: No results for input(s): "GLUCAP" in the last 168 hours. D-Dimer No results for input(s): "DDIMER" in the last 72 hours. Hgb A1c No results for input(s): "HGBA1C" in the last 72 hours. Lipid Profile No results for input(s): "CHOL", "HDL", "LDLCALC", "TRIG", "CHOLHDL", "LDLDIRECT" in the last 72 hours. Thyroid function studies No results for input(s): "TSH", "T4TOTAL", "T3FREE", "THYROIDAB" in the last 72 hours.  Invalid input(s): "FREET3" Anemia work up No results for input(s): "VITAMINB12", "FOLATE", "FERRITIN", "TIBC", "IRON", "RETICCTPCT" in the last 72 hours. Urinalysis No results found for: "COLORURINE", "APPEARANCEUR", "LABSPEC", "PHURINE", "GLUCOSEU", "HGBUR", "BILIRUBINUR", "KETONESUR", "PROTEINUR", "UROBILINOGEN", "NITRITE", "LEUKOCYTESUR" Sepsis Labs Recent Labs  Lab 11/27/22 0500 11/28/22 0510 11/30/22 0558 12/01/22 0559  WBC 12.6* 8.7 8.8 11.1*   Microbiology Recent Results (from the past 240 hour(s))  Gastrointestinal Panel by PCR , Stool     Status: None   Collection Time: 11/27/22  2:24 AM  Result Value Ref Range Status   Campylobacter species NOT DETECTED NOT  DETECTED Final   Plesimonas shigelloides NOT DETECTED NOT DETECTED Final   Salmonella species NOT DETECTED NOT DETECTED Final   Yersinia enterocolitica NOT DETECTED NOT DETECTED Final   Vibrio species NOT DETECTED NOT DETECTED Final   Vibrio cholerae NOT DETECTED NOT DETECTED Final   Enteroaggregative E coli (EAEC) NOT DETECTED NOT DETECTED Final   Enteropathogenic E coli (EPEC) NOT DETECTED NOT DETECTED Final   Enterotoxigenic E coli (ETEC) NOT DETECTED NOT DETECTED Final   Shiga like toxin producing E coli (STEC) NOT DETECTED NOT DETECTED Final   Shigella/Enteroinvasive E coli (EIEC) NOT DETECTED NOT DETECTED Final   Cryptosporidium NOT DETECTED NOT DETECTED Final   Cyclospora cayetanensis NOT DETECTED NOT DETECTED Final   Entamoeba histolytica NOT DETECTED NOT DETECTED Final   Giardia lamblia NOT DETECTED NOT DETECTED Final   Adenovirus F40/41 NOT DETECTED NOT DETECTED Final   Astrovirus NOT DETECTED NOT DETECTED Final   Norovirus GI/GII NOT DETECTED NOT DETECTED Final   Rotavirus A NOT DETECTED NOT DETECTED Final   Sapovirus (I, II, IV, and V) NOT DETECTED NOT DETECTED Final    Comment: Performed at [facility], [address] [place], [place] [address]  C Difficile Quick Screen w PCR reflex     Status: Abnormal   Collection Time: 11/27/22  2:25 AM  Result Value Ref Range Status   C Diff antigen POSITIVE (A) NEGATIVE Final   C Diff toxin POSITIVE (A) NEGATIVE Final   C Diff interpretation Toxin producing C.  difficile detected.  Final    Comment: CRITICAL RESULT CALLED TO, READ BACK BY AND VERIFIED WITH: RIMANDO,J AT 0319 ON 11/27/22 BY LUZOLOP Performed at [facility], [address] [place], [place] [address]     Procedures/Studies: No results found.   Time coordinating discharge: Over 30 minutes    Lewie Chamber, MD  [facility] 12/01/2022, 4:33 PM

## 2022-12-01 NOTE — TOC Transition Note (Addendum)
Transition of Care [facility]) - CM/SW Discharge Note   Patient Details  Name: Deborah Branch MRN: [id number] Date of Birth: 04-21-1935  Transition of Care [facility]) CM/SW Contact:  Beckie Busing, RN Phone Number:[phone]  12/01/2022, 10:07 AM   Clinical Narrative:   [facility] acknowledges consult for home health/ dme needs.  Cm at bedside introduced self and explained role. Choice offered for home health. Patient states that she is ok with [facility]. Referral has been sent to [facility] with [place]. Confirmation pending.  1017 Home Health referral has been accepted by [facility] with Frances Furbish. There are no other TOC needs. TOC will sign off.    Final next level of care: Home w Home Health Services Barriers to Discharge: No Barriers Identified   Patient Goals and CMS Choice CMS Medicare.gov Compare Post Acute Care list provided to:: Patient Choice offered to / list presented to : Patient  Discharge Placement                         Discharge Plan and Services Additional resources added to the After Visit Summary for                  DME Arranged: N/A DME Agency: NA       HH Arranged: PT HH Agency: [facility] Date [facility] Agency Contacted: 12/01/22 Time HH Agency Contacted: 1007 Representative spoke with at [facility] Agency: Kandee Keen  Social Determinants of Health (SDOH) Interventions SDOH Screenings   Food Insecurity: No Food Insecurity (11/26/2022)  Housing: Low Risk  (11/26/2022)  Transportation Needs: No Transportation Needs (11/26/2022)  Utilities: Not At Risk (11/26/2022)  Alcohol Screen: Low Risk  (08/17/2022)  Depression (PHQ2-9): Low Risk  (09/18/2022)  Financial Resource Strain: Low Risk  (08/17/2022)  Physical Activity: Inactive (08/17/2022)  Social Connections: Moderately Isolated (08/17/2022)  Stress: No Stress Concern Present (08/17/2022)  Tobacco Use: Medium Risk (11/26/2022)     Readmission Risk Interventions    11/27/2022    1:24 PM  Readmission Risk Prevention Plan   Transportation Screening Complete  PCP or Specialist Appt within 5-7 Days Complete  Home Care Screening Complete  Medication Review (RN CM) Complete

## 2022-12-04 ENCOUNTER — Telehealth: Payer: Self-pay

## 2022-12-04 NOTE — Transitions of Care (Post Inpatient/ED Visit) (Signed)
12/04/2022  Name: Deborah Branch MRN: [id number] DOB: [birth year]  Today's TOC FU Call Status: Today's TOC FU Call Status:: Successful TOC FU Call Competed TOC FU Call Complete Date: 12/04/22  Transition Care Management Follow-up Telephone Call Date of Discharge: 12/01/22 Discharge Facility: [facility]) Type of Discharge: Inpatient Admission Primary Inpatient Discharge Diagnosis:: C-Difficile How have you been since you were released from the hospital?: Better Any questions or concerns?: No  Items Reviewed: Did you receive and understand the discharge instructions provided?: Yes Medications obtained,verified, and reconciled?: Yes (Medications Reviewed) Any new allergies since your discharge?: No Dietary orders reviewed?: Yes Type of Diet Ordered:: Low sodium heart healthy Do you have support at home?: Yes People in Home: child(ren), adult Name of Support/Comfort Primary Source: [name]  Medications Reviewed Today: Medications Reviewed Today     Reviewed by Jodelle Gross, RN (Case Manager) on 12/04/22 at 1104  Med List Status: <None>   Medication Order Taking? Sig Documenting Provider Last Dose Status Informant  amLODipine (NORVASC) 5 MG tablet [id number] Yes TAKE 1 TABLET BY MOUTH ONCE DAILY O'Neal, Ronnald Ramp, MD Taking Active   calcium carbonate (OSCAL) 1500 (600 Ca) MG TABS tablet [id number] Yes Take 1 tablet by mouth 2 (two) times daily with a meal. [provider] Taking Active Self, Pharmacy Records, Child  cetirizine (ZYRTEC) 10 MG tablet [id number] Yes Take 10 mg by mouth daily. [provider] Taking Active   denosumab (PROLIA) 60 MG/ML SOSY injection [id number] Yes Inject 60 mg into the skin every 6 (six) months. [provider] Taking Active Self, Pharmacy Records, Child  fidaxomicin (DIFICID) 200 MG TABS tablet [id number] Yes Take 1 tablet (200 mg) by mouth 2 times daily for 12 doses. Lewie Chamber, MD Taking Active    fluticasone-salmeterol (ADVAIR DISKUS) 100-50 MCG/ACT AEPB [id number] Yes Inhale 1 puff into the lungs 2 (two) times daily. Waymon Budge, MD Taking Active   furosemide (LASIX) 20 MG tablet [id number] Yes Take 20 mg by mouth. [provider] Taking Active   ipratropium-albuterol (DUONEB) 0.5-2.5 (3) MG/3ML SOLN [id number] Yes Take 3 mLs by nebulization every 6 (six) hours as needed (shortness of breath and cough). Shon Hale, MD Taking Active   lisinopril (ZESTRIL) 40 MG tablet [id number] Yes Take 1 tablet (40 mg total) by mouth daily. Gabriel Earing, FNP Taking Active   Omalizumab [facility]) [id number] No Inject 225 mg into the skin every 3 (three) months. [provider] Unknown Active Self, Pharmacy Records, Child  omeprazole (PRILOSEC) 20 MG capsule [id number] Yes TAKE ONE CAPSULE BY MOUTH DAILY Gabriel Earing, FNP Taking Active   simvastatin (ZOCOR) 20 MG tablet [id number] Yes Take 1 tablet (20 mg total) by mouth at bedtime. Gabriel Earing, FNP Taking Active   zinc gluconate 50 MG tablet [id number] Yes Take 50 mg by mouth daily. [provider] Taking Active Self, Pharmacy Records, Child            Home Care and Equipment/Supplies: Were Home Health Services Ordered?: Yes Name of Home Health Agency:: [facility] Has Agency set up a time to come to your home?: No EMR reviewed for Home Health Orders:  Frances Furbish called, they will call today) Any new equipment or medical supplies ordered?: No  Functional Questionnaire: Do you need assistance with bathing/showering or dressing?: No Do you need assistance with meal preparation?: No Do you need assistance with eating?: No Do you have difficulty maintaining continence: No Do you need assistance with getting out of  bed/getting out of a chair/moving?: No Do you have difficulty managing or taking your medications?: No  Follow up appointments reviewed: PCP Follow-up appointment confirmed?: Yes Date of PCP  follow-up appointment?: 12/20/22 (Patient going out of town so could not come sooner) Follow-up Provider: Harlow Mares NP Specialist Hospital Follow-up appointment confirmed?: Yes Date of Specialist follow-up appointment?: 01/23/23 Follow-Up Specialty Provider:: Dr. Russella Dar Do you need transportation to your follow-up appointment?: No Do you understand care options if your condition(s) worsen?: Yes-patient verbalized understanding  SDOH Interventions Today    Flowsheet Row Most Recent Value  SDOH Interventions   Food Insecurity Interventions Intervention Not Indicated  Housing Interventions Intervention Not Indicated  Transportation Interventions Intervention Not Indicated  Financial Strain Interventions Intervention Not Indicated      Interventions Today    Flowsheet Row Most Recent Value  Chronic Disease   Chronic disease during today's visit Congestive Heart Failure (CHF), Hypertension (HTN)  General Interventions   General Interventions Discussed/Reviewed General Interventions Discussed, General Interventions Reviewed  Nutrition Interventions   Nutrition Discussed/Reviewed Nutrition Discussed, Nutrition Reviewed  Pharmacy Interventions   Pharmacy Dicussed/Reviewed Pharmacy Topics Discussed       TOC Interventions Today    Flowsheet Row Most Recent Value  TOC Interventions   TOC Interventions Discussed/Reviewed TOC Interventions Discussed, Arranged PCP follow up less than 12 days/Care Guide scheduled       Jodelle Gross, RN, BSN, CCM Care Management Coordinator [facility] Phone: [phone]/Fax: [phone]

## 2022-12-04 NOTE — Telephone Encounter (Signed)
I have spoken to patient who was discharged from the hospital over the weekend following C Difficile diagnosis. Patient is finishing a course of Dificid and says that her diarrhea has improved and continues to do so. Patient does describe chronic diarrhea at times though and both she and her sone would like her to have a follow up in the office to discuss. Patient has been scheduled to see Dr Russella Dar 01/23/23. Patient verbalizes understanding of this.

## 2022-12-05 ENCOUNTER — Telehealth: Payer: Self-pay | Admitting: Family Medicine

## 2022-12-05 NOTE — Telephone Encounter (Signed)
Pts son called stating that pt was prescribed pt a fluid pill, Lasix 20mg  and says she is retaining some fluid. Wants to know if it is ok to double up on the lasix until fluid comes down? Fluid is in both lower extremities at ankles.   Can covering provider advise on this?

## 2022-12-05 NOTE — Telephone Encounter (Signed)
Spoke to patients son per signed DPR. Advised to come in tomorrow for scheduled appt to discuss options

## 2022-12-06 ENCOUNTER — Ambulatory Visit: Payer: Medicare PPO | Admitting: Family Medicine

## 2022-12-06 ENCOUNTER — Encounter: Payer: Self-pay | Admitting: Internal Medicine

## 2022-12-06 ENCOUNTER — Encounter: Payer: Self-pay | Admitting: Family Medicine

## 2022-12-06 VITALS — BP 148/65 | HR 87 | Temp 97.6°F | Resp 18 | Ht 60.0 in | Wt 152.0 lb

## 2022-12-06 DIAGNOSIS — A0472 Enterocolitis due to Clostridium difficile, not specified as recurrent: Secondary | ICD-10-CM | POA: Diagnosis not present

## 2022-12-06 NOTE — Progress Notes (Signed)
Subjective:  Patient ID: Deborah Branch, female    DOB: October 13, 1934  Age: 88 y.o. MRN: [id number]  CC: Follow-up [facility] follow up )   HPI Deborah Branch presents for follow up of hospitalization with C.Dif from 6/2 to 12/01/22. Now Bms are back to normal. No abd pain or nausea. No fever. Swelling in the ankles started in the hospital. Ankles ached yesterday. A little better today. Yesterday it hurt too much to walk.      12/06/2022    2:11 PM 09/18/2022   11:29 AM 08/17/2022   10:35 AM  Depression screen PHQ 2/9  Decreased Interest 0 0 0  Down, Depressed, Hopeless 0 0 0  PHQ - 2 Score 0 0 0  Altered sleeping 0 1   Tired, decreased energy 0 0   Change in appetite 0 0   Feeling bad or failure about yourself  0 0   Trouble concentrating 0 0   Moving slowly or fidgety/restless 0 0   Suicidal thoughts 0 0   PHQ-9 Score 0 1   Difficult doing work/chores Not difficult at all Not difficult at all     History Deborah Branch has a past medical history of Allergic asthma, Asthma, Cataract, Congestive heart failure (CHF) (HCC), Eczema, GERD (gastroesophageal reflux disease), Hyperlipidemia, Hypertension, and Osteoporosis.   She has a past surgical history that includes Cataract extraction, bilateral (2012).   Her family history includes Heart attack in her paternal grandfather.She reports that she has quit smoking. Her smoking use included cigarettes. She has never used smokeless tobacco. She reports that she does not currently use alcohol. She reports that she does not use drugs.    ROS Review of Systems  Constitutional: Negative.   HENT:  Negative for congestion.   Eyes:  Negative for visual disturbance.  Respiratory:  Negative for shortness of breath.   Cardiovascular:  Positive for leg swelling. Negative for chest pain.  Gastrointestinal:  Negative for abdominal pain, constipation, diarrhea, nausea and vomiting.  Genitourinary:  Negative for difficulty urinating.  Musculoskeletal:  Positive  for arthralgias. Negative for myalgias.  Neurological:  Negative for headaches.  Psychiatric/Behavioral:  Negative for sleep disturbance.     Objective:  BP (!) 148/65   Pulse 87   Temp 97.6 F (36.4 C)   Resp 18   Ht 5' (1.524 m)   Wt 152 lb (68.9 kg)   SpO2 96%   BMI 29.69 kg/m   BP Readings from Last 3 Encounters:  12/07/22 (!) 150/63  12/06/22 (!) 148/65  12/01/22 (!) 131/48    Wt Readings from Last 3 Encounters:  12/07/22 153 lb (69.4 kg)  12/06/22 152 lb (68.9 kg)  12/01/22 154 lb 8.7 oz (70.1 kg)     Physical Exam Constitutional:      General: She is not in acute distress.    Appearance: She is well-developed.  HENT:     Head: Normocephalic and atraumatic.  Eyes:     Conjunctiva/sclera: Conjunctivae normal.     Pupils: Pupils are equal, round, and reactive to light.  Neck:     Thyroid: No thyromegaly.  Cardiovascular:     Rate and Rhythm: Normal rate and regular rhythm.     Heart sounds: Normal heart sounds. No murmur heard. Pulmonary:     Effort: Pulmonary effort is normal. No respiratory distress.     Breath sounds: Normal breath sounds. No wheezing or rales.  Abdominal:     General: Bowel sounds are normal. There is  no distension.     Palpations: Abdomen is soft.     Tenderness: There is no abdominal tenderness.  Musculoskeletal:        General: Normal range of motion.     Cervical back: Normal range of motion and neck supple.  Lymphadenopathy:     Cervical: No cervical adenopathy.  Skin:    General: Skin is warm and dry.  Neurological:     Mental Status: She is alert and oriented to person, place, and time.  Psychiatric:        Behavior: Behavior normal.        Thought Content: Thought content normal.        Judgment: Judgment normal.       Assessment & Plan:   Deborah Branch was seen today for follow-up.  Diagnoses and all orders for this visit:  C. difficile colitis -     CBC with Differential/Platelet -     CMP14+EGFR -      Zinc       I am having Deborah Branch maintain her calcium carbonate, Omalizumab (XOLAIR [place]), zinc gluconate, denosumab, ipratropium-albuterol, simvastatin, fluticasone-salmeterol, furosemide, cetirizine, lisinopril, omeprazole, and amLODipine.  Allergies as of 12/06/2022       Reactions   Clarithromycin    REACTION: itching   Cleocin [clindamycin] Other (See Comments)   C-diff   Penicillins    REACTION: hives        Medication List        Accurate as of December 06, 2022 11:59 PM. If you have any questions, ask your nurse or doctor.          amLODipine 5 MG tablet Commonly known as: NORVASC TAKE 1 TABLET BY MOUTH ONCE DAILY   calcium carbonate 1500 (600 Ca) MG Tabs tablet Commonly known as: OSCAL Take 1 tablet by mouth 2 (two) times daily with a meal.   cetirizine 10 MG tablet Commonly known as: ZYRTEC Take 10 mg by mouth daily.   denosumab 60 MG/ML Sosy injection Commonly known as: PROLIA Inject 60 mg into the skin every 6 (six) months.   Dificid 200 MG Tabs tablet Generic drug: fidaxomicin Take 1 tablet (200 mg) by mouth 2 times daily for 12 doses.   fluticasone-salmeterol 100-50 MCG/ACT Aepb Commonly known as: Advair [place]us Inhale 1 puff into the lungs 2 (two) times daily.   furosemide 20 MG tablet Commonly known as: LASIX Take 20 mg by mouth.   ipratropium-albuterol 0.5-2.5 (3) MG/3ML Soln Commonly known as: DUONEB Take 3 mLs by nebulization every 6 (six) hours as needed (shortness of breath and cough).   lisinopril 40 MG tablet Commonly known as: ZESTRIL Take 1 tablet (40 mg total) by mouth daily.   omeprazole 20 MG capsule Commonly known as: PRILOSEC TAKE ONE CAPSULE BY MOUTH DAILY   simvastatin 20 MG tablet Commonly known as: ZOCOR Take 1 tablet (20 mg total) by mouth at bedtime.   XOLAIR [place] Inject 225 mg into the skin every 3 (three) months.   zinc gluconate 50 MG tablet Take 50 mg by mouth daily.         Follow-up: Return in  about 1 month (around 01/05/2023) for PCP.  Mechele Claude, M.D.

## 2022-12-07 ENCOUNTER — Ambulatory Visit (INDEPENDENT_AMBULATORY_CARE_PROVIDER_SITE_OTHER): Payer: Medicare PPO

## 2022-12-07 VITALS — BP 150/63 | HR 53 | Temp 97.8°F | Resp 17 | Ht 60.0 in | Wt 153.0 lb

## 2022-12-07 DIAGNOSIS — J4540 Moderate persistent asthma, uncomplicated: Secondary | ICD-10-CM | POA: Diagnosis not present

## 2022-12-07 LAB — CBC WITH DIFFERENTIAL/PLATELET
Eos: 2 %
Lymphocytes Absolute: 2.6 10*3/uL (ref 0.7–3.1)
MCHC: 32.6 g/dL (ref 31.5–35.7)
MCV: 90 fL (ref 79–97)
Monocytes: 7 %
Neutrophils Absolute: 7.2 10*3/uL — ABNORMAL HIGH (ref 1.4–7.0)

## 2022-12-07 LAB — ZINC

## 2022-12-07 LAB — CMP14+EGFR
ALT: 15 IU/L (ref 0–32)
BUN/Creatinine Ratio: 10 — ABNORMAL LOW (ref 12–28)
Bilirubin Total: 0.4 mg/dL (ref 0.0–1.2)
Globulin, Total: 2.3 g/dL (ref 1.5–4.5)
Sodium: 143 mmol/L (ref 134–144)
eGFR: 65 mL/min/{1.73_m2} (ref >59)

## 2022-12-07 MED ORDER — OMALIZUMAB 75 MG/0.5ML ~~LOC~~ SOSY
225.0000 mg | PREFILLED_SYRINGE | Freq: Once | SUBCUTANEOUS | Status: AC
  Administered 2022-12-07: 225 mg via SUBCUTANEOUS
  Filled 2022-12-07: qty 0.5

## 2022-12-07 NOTE — Progress Notes (Signed)
Diagnosis: Asthma  Provider:  Chilton Greathouse MD  Procedure: Injection  Xolair (Omalizumab), Dose: 225 mg, Site: subcutaneous, Number of injections: 2  Post Care: Observation period completed  Discharge: Condition: Good, Destination: Home . AVS Provided  Performed by:  Valarie Cones, LPN

## 2022-12-08 DIAGNOSIS — K5730 Diverticulosis of large intestine without perforation or abscess without bleeding: Secondary | ICD-10-CM | POA: Diagnosis not present

## 2022-12-08 DIAGNOSIS — K219 Gastro-esophageal reflux disease without esophagitis: Secondary | ICD-10-CM | POA: Diagnosis not present

## 2022-12-08 DIAGNOSIS — I5032 Chronic diastolic (congestive) heart failure: Secondary | ICD-10-CM | POA: Diagnosis not present

## 2022-12-08 DIAGNOSIS — J4540 Moderate persistent asthma, uncomplicated: Secondary | ICD-10-CM | POA: Diagnosis not present

## 2022-12-08 DIAGNOSIS — K529 Noninfective gastroenteritis and colitis, unspecified: Secondary | ICD-10-CM | POA: Diagnosis not present

## 2022-12-08 DIAGNOSIS — I110 Hypertensive heart disease with heart failure: Secondary | ICD-10-CM | POA: Diagnosis not present

## 2022-12-08 DIAGNOSIS — M810 Age-related osteoporosis without current pathological fracture: Secondary | ICD-10-CM | POA: Diagnosis not present

## 2022-12-08 DIAGNOSIS — E782 Mixed hyperlipidemia: Secondary | ICD-10-CM | POA: Diagnosis not present

## 2022-12-08 LAB — CBC WITH DIFFERENTIAL/PLATELET
Basophils Absolute: 0.1 10*3/uL (ref 0.0–0.2)
Basos: 1 %
EOS (ABSOLUTE): 0.2 10*3/uL (ref 0.0–0.4)
Hematocrit: 37.1 % (ref 34.0–46.6)
Hemoglobin: 12.1 g/dL (ref 11.1–15.9)
Immature Grans (Abs): 0.2 10*3/uL — ABNORMAL HIGH (ref 0.0–0.1)
Immature Granulocytes: 2 %
Lymphs: 24 %
MCH: 29.3 pg (ref 26.6–33.0)
Monocytes Absolute: 0.8 10*3/uL (ref 0.1–0.9)
Neutrophils: 64 %
Platelets: 372 10*3/uL (ref 150–450)
RBC: 4.13 x10E6/uL (ref 3.77–5.28)
RDW: 13.1 % (ref 11.7–15.4)
WBC: 11 10*3/uL — ABNORMAL HIGH (ref 3.4–10.8)

## 2022-12-08 LAB — CMP14+EGFR
AST: 15 IU/L (ref 0–40)
Albumin/Globulin Ratio: 1.5
Albumin: 3.5 g/dL — ABNORMAL LOW (ref 3.7–4.7)
Alkaline Phosphatase: 58 IU/L (ref 44–121)
BUN: 9 mg/dL (ref 8–27)
CO2: 23 mmol/L (ref 20–29)
Calcium: 8.6 mg/dL — ABNORMAL LOW (ref 8.7–10.3)
Chloride: 105 mmol/L (ref 96–106)
Creatinine, Ser: 0.87 mg/dL (ref 0.57–1.00)
Glucose: 109 mg/dL — ABNORMAL HIGH (ref 70–99)
Potassium: 4 mmol/L (ref 3.5–5.2)
Total Protein: 5.8 g/dL — ABNORMAL LOW (ref 6.0–8.5)

## 2022-12-08 NOTE — Progress Notes (Signed)
Hello Akina,  Your lab result is normal and/or stable.Some minor variations that are not significant are commonly marked abnormal, but do not represent any medical problem for you.  Best regards, Kamryn Gauthier, M.D.

## 2022-12-11 ENCOUNTER — Encounter: Payer: Self-pay | Admitting: Family Medicine

## 2022-12-18 ENCOUNTER — Ambulatory Visit (INDEPENDENT_AMBULATORY_CARE_PROVIDER_SITE_OTHER): Payer: Medicare PPO

## 2022-12-18 DIAGNOSIS — E782 Mixed hyperlipidemia: Secondary | ICD-10-CM

## 2022-12-18 DIAGNOSIS — K529 Noninfective gastroenteritis and colitis, unspecified: Secondary | ICD-10-CM

## 2022-12-18 DIAGNOSIS — I110 Hypertensive heart disease with heart failure: Secondary | ICD-10-CM | POA: Diagnosis not present

## 2022-12-18 DIAGNOSIS — I5032 Chronic diastolic (congestive) heart failure: Secondary | ICD-10-CM | POA: Diagnosis not present

## 2022-12-18 DIAGNOSIS — K219 Gastro-esophageal reflux disease without esophagitis: Secondary | ICD-10-CM | POA: Diagnosis not present

## 2022-12-18 DIAGNOSIS — M810 Age-related osteoporosis without current pathological fracture: Secondary | ICD-10-CM

## 2022-12-18 DIAGNOSIS — K5730 Diverticulosis of large intestine without perforation or abscess without bleeding: Secondary | ICD-10-CM

## 2022-12-18 DIAGNOSIS — J4540 Moderate persistent asthma, uncomplicated: Secondary | ICD-10-CM

## 2022-12-20 ENCOUNTER — Inpatient Hospital Stay: Payer: Medicare PPO

## 2022-12-20 DIAGNOSIS — I5032 Chronic diastolic (congestive) heart failure: Secondary | ICD-10-CM | POA: Diagnosis not present

## 2022-12-20 DIAGNOSIS — K529 Noninfective gastroenteritis and colitis, unspecified: Secondary | ICD-10-CM | POA: Diagnosis not present

## 2022-12-20 DIAGNOSIS — M810 Age-related osteoporosis without current pathological fracture: Secondary | ICD-10-CM | POA: Diagnosis not present

## 2022-12-20 DIAGNOSIS — K219 Gastro-esophageal reflux disease without esophagitis: Secondary | ICD-10-CM | POA: Diagnosis not present

## 2022-12-20 DIAGNOSIS — E782 Mixed hyperlipidemia: Secondary | ICD-10-CM | POA: Diagnosis not present

## 2022-12-20 DIAGNOSIS — I110 Hypertensive heart disease with heart failure: Secondary | ICD-10-CM | POA: Diagnosis not present

## 2022-12-20 DIAGNOSIS — J4540 Moderate persistent asthma, uncomplicated: Secondary | ICD-10-CM | POA: Diagnosis not present

## 2022-12-20 DIAGNOSIS — K5730 Diverticulosis of large intestine without perforation or abscess without bleeding: Secondary | ICD-10-CM | POA: Diagnosis not present

## 2022-12-27 ENCOUNTER — Encounter: Payer: Self-pay | Admitting: Family Medicine

## 2022-12-27 ENCOUNTER — Ambulatory Visit: Payer: Medicare PPO | Admitting: Family Medicine

## 2022-12-27 VITALS — BP 110/52 | HR 52 | Temp 98.3°F | Ht 60.0 in | Wt 145.6 lb

## 2022-12-27 DIAGNOSIS — A0472 Enterocolitis due to Clostridium difficile, not specified as recurrent: Secondary | ICD-10-CM | POA: Diagnosis not present

## 2022-12-27 LAB — CBC WITH DIFFERENTIAL/PLATELET
Basophils Absolute: 0.1 10*3/uL (ref 0.0–0.2)
Basos: 1 %
EOS (ABSOLUTE): 0 10*3/uL (ref 0.0–0.4)
Eos: 0 %
Hematocrit: 37.5 % (ref 34.0–46.6)
Hemoglobin: 12.6 g/dL (ref 11.1–15.9)
Immature Grans (Abs): 0 10*3/uL (ref 0.0–0.1)
Immature Granulocytes: 0 %
Lymphocytes Absolute: 1.8 10*3/uL (ref 0.7–3.1)
Lymphs: 15 %
MCH: 29.7 pg (ref 26.6–33.0)
MCHC: 33.6 g/dL (ref 31.5–35.7)
MCV: 88 fL (ref 79–97)
Monocytes Absolute: 1.6 10*3/uL — ABNORMAL HIGH (ref 0.1–0.9)
Monocytes: 13 %
Neutrophils Absolute: 8.6 10*3/uL — ABNORMAL HIGH (ref 1.4–7.0)
Neutrophils: 71 %
Platelets: 246 10*3/uL (ref 150–450)
RBC: 4.24 x10E6/uL (ref 3.77–5.28)
RDW: 13.3 % (ref 11.7–15.4)
WBC: 12.1 10*3/uL — ABNORMAL HIGH (ref 3.4–10.8)

## 2022-12-27 LAB — BMP8+EGFR
BUN/Creatinine Ratio: 14 (ref 12–28)
BUN: 20 mg/dL (ref 8–27)
CO2: 18 mmol/L — ABNORMAL LOW (ref 20–29)
Calcium: 8.1 mg/dL — ABNORMAL LOW (ref 8.7–10.3)
Chloride: 97 mmol/L (ref 96–106)
Creatinine, Ser: 1.44 mg/dL — ABNORMAL HIGH (ref 0.57–1.00)
Glucose: 112 mg/dL — ABNORMAL HIGH (ref 70–99)
Potassium: 3.9 mmol/L (ref 3.5–5.2)
Sodium: 135 mmol/L (ref 134–144)
eGFR: 35 mL/min/{1.73_m2} — ABNORMAL LOW (ref >59)

## 2022-12-27 MED ORDER — FIDAXOMICIN 200 MG PO TABS
200.0000 mg | ORAL_TABLET | Freq: Two times a day (BID) | ORAL | 0 refills | Status: AC
Start: 2022-12-27 — End: 2023-01-06

## 2022-12-27 NOTE — Patient Instructions (Signed)
Clostridioides Difficile Infection Clostridioides difficile infection, also known as C. difficile or C. diff infection, happens when too much C. diff bacteria grows. This can cause severe diarrhea and inflammation of the colon (colitis). It is linked to recent use of antibiotic medicine. This infection can be passed from person to person (is contagious). You also may be exposed to the bacteria from contact with food, water, or surfaces that have the bacteria on them. What are the causes? Certain bacteria live in the colon and help to digest food. This infection develops when the balance of helpful bacteria in the colon changes and the C. diff bacteria grow out of control. This is often caused by taking antibiotics. What increases the risk? You may be more likely to develop this condition if you: Take certain antibiotics that kill many types of bacteria or take antibiotics for a long time. Have an extended stay in a hospital or long-term care facility. Are older than age 65. Have had a C. diff infection before or have been exposed to C. diff bacteria. Have a weakened disease-fighting system (immune system). Take a medicine to reduce stomach acid, such as a proton pump inhibitor, for a long time. Have a serious underlying condition, such as colon cancer or inflammatory bowel disease (IBD). Have had a gastrointestinal (GI) tract procedure. What are the signs or symptoms? Symptoms of this condition include: Diarrhea (three or more times a day) for several days. Fever. Loss of appetite. Nausea. Swelling, pain, cramping, or tenderness in the abdomen. How is this diagnosed? This condition is diagnosed with: Your medical history and a physical exam. Tests, which may include: A test for C. diff in your stool (feces). Blood tests. Imaging tests, such as a CT scan of your abdomen. A procedure in which your colon is examined. This is rare. How is this treated? Treatment for this condition may  include: Stopping the antibiotics that you were taking when the C. diff infection began. Do this only as told by your health care provider. Taking certain antibiotics to stop C. diff growth. Taking stool from a healthy person and placing it into your colon (fecal transplant). This may be done if the infection keeps coming back. Having surgery to remove the infected part of the colon. This is rare. Follow these instructions at home: Medicines Take over-the-counter and prescription medicines only as told by your health care provider. Take antibiotic medicine as told by your health care provider. Do not stop taking the antibiotic even if you start to feel better. Do not treat diarrhea with medicines unless your health care provider tells you to. Eating and drinking  Follow instructions from your health care provider about eating and drinking restrictions. Eat bland foods in small amounts that are easy to digest. These include bananas, applesauce, rice, lean meats, toast, and crackers. Follow instructions on replacing body fluid that has been lost (rehydrate). This may include: Drinking clear fluids, such as water, clear fruit juice that is diluted with water, and low-calorie sports drinks. Sucking on ice chips. Taking an oral rehydration solution (ORS). This drink is sold at pharmacies and retail stores. Avoid milk, caffeine, and alcohol. Drink enough fluid to keep your urine pale yellow. General instructions Wash your hands often with soap and water for at least 20 seconds. Bathe or shower using soap and water daily. Return to your normal activities as told by your health care provider. Ask your health care provider what activities are safe for you. Be sure your home is   clean before you leave the hospital or clinic to go home. Then continue daily cleaning for at least a week. Keep all follow-up visits. This is important. How is this prevented? Hand hygiene  Wash your hands with soap and  water for at least 20 seconds before preparing food and after using the bathroom. Make sure the people you live with also wash their hands often with soap and water for at least 20 seconds. If you are being treated at a hospital or clinic, make sure that all health care providers and visitors wash their hands with soap and water before touching you. Contact precautions Tell your health care team right away if you develop diarrhea while in a hospital or long-term care facility. When visiting someone in a hospital or a long-term care facility, follow guidelines for wearing a gown, gloves, or other protective equipment. If possible, avoid contact with people who have diarrhea. Use a separate bathroom if you are sick and live with other people, if possible. Clean environment Clean surfaces that are touched often every day. C. diff bacteria are killed only by cleaning products that contain 10% chlorine bleach solution. Be sure to: Read the product's label to make sure the product will kill the bacteria on the surface you are cleaning. Clean frequently touched surfaces, such as toilet seats and flush handles, bathtubs, sinks, doorknobs, and work surfaces. If you are in the hospital, make sure that staff members clean the surfaces in your room daily. Let a staff person know right away if body fluids have splashed or spilled. Washing clothes and linens Use a powder laundry detergent containing chlorine bleach instead of liquid detergent. Powder detergents contain chlorine bleach in low levels to help kill bacteria. Run your empty washing machine on the hot setting once a month with enough detergent for a full load. This will kill any remaining C. diff bacteria. Contact a health care provider if: Your symptoms do not get better, or they get worse, even with treatment. Your symptoms go away and then come back. You have a fever. You develop new symptoms. Get help right away if: You have more pain or  tenderness in your abdomen. You have stool that is mostly bloody, or looks black and tarry. You cannot eat or drink without vomiting. You have signs of dehydration, such as: Dark urine, very little urine, or no urine. Cracked lips or dry mouth. Not making tears when you cry. Sunken eyes. Sleepiness. Weakness or dizziness. Summary Clostridioides difficile infection, or C. diff infection, can cause severe diarrhea and inflammation of the colon (colitis). It is linked to recent antibiotic use. C. diff infection can spread from person to person (is contagious). You also may be exposed to the bacteria from contact with food, water, or surfaces that have the bacteria on them. This infection may be treated by stopping the antibiotics you were using when the infection began. Fecal transplant or surgery may be needed for repeat or severe infections. Washing hands with soap and water for at least 20 seconds after you use the bathroom and before you eat, and cleaning surfaces with a 10% bleach solution, can help prevent or limit spread of this infection. This information is not intended to replace advice given to you by your health care provider. Make sure you discuss any questions you have with your health care provider. Document Revised: 10/02/2019 Document Reviewed: 10/02/2019 Elsevier Patient Education  2024 [employer]vier Inc.  

## 2022-12-27 NOTE — Progress Notes (Signed)
   Acute Office Visit  Subjective:     Patient ID: Deborah Branch, female    DOB: 1935-05-29, 88 y.o.   MRN: [id number]  Chief Complaint  Patient presents with   c diff   Diarrhea    HPI Patient is in today for diarrhea. She was seen in the [facility] on 11/26/22 and dx with c. Diff. She was treated with fidaxomicine BID x 12 days. She reprots symtpoms improved with treatmend and then worsened again on 12/24/22. She is having watery stool 4-5x day on 12/24/22. Foul smell present. She is taking imodium with improvement. Denies dizziness or lightheadedness. She is trying to stay well hydrated.   She has a follow up with GI on 01/02/23.  ROS As per HPI.      Objective:    BP (!) 110/52   Pulse (!) 52   Temp 98.3 F (36.8 C) (Temporal)   Ht 5' (1.524 m)   Wt 145 lb 9.6 oz (66 kg)   SpO2 96%   BMI 28.44 kg/m  BP Readings from Last 3 Encounters:  12/27/22 (!) 110/52  12/07/22 (!) 150/63  12/06/22 (!) 148/65      Physical Exam Vitals and nursing note reviewed.  Constitutional:      General: She is not in acute distress.    Appearance: Normal appearance. She is not ill-appearing, toxic-appearing or diaphoretic.  Cardiovascular:     Rate and Rhythm: Regular rhythm.     Heart sounds: Normal heart sounds. No murmur heard. Pulmonary:     Effort: Pulmonary effort is normal. No respiratory distress.     Breath sounds: Normal breath sounds. No wheezing.  Abdominal:     General: Bowel sounds are normal. There is no distension.     Palpations: Abdomen is soft.     Tenderness: There is no abdominal tenderness. There is no guarding or rebound.  Musculoskeletal:     Right lower leg: No edema.     Left lower leg: No edema.  Skin:    General: Skin is warm and dry.  Neurological:     General: No focal deficit present.     Mental Status: She is alert and oriented to person, place, and time.  Psychiatric:        Mood and Affect: Mood normal.        Behavior: Behavior normal.     No results  found for any visits on 12/27/22.      Assessment & Plan:   Mystica was seen today for c diff and diarrhea.  Diagnoses and all orders for this visit:  C. difficile diarrhea First recurrence. Repeat dificid as below. Labs pending as below. Keep follow up appt with GI. Discussed hydration and prevention of spread. Denies dizziness or lightheadedness. Return to office for new or worsening symptoms, or if symptoms persist.  -     fidaxomicin (DIFICID) 200 MG TABS tablet; Take 1 tablet (200 mg total) by mouth 2 (two) times daily for 10 days. -     BMP8+EGFR -     CBC with Differential/Platelet  The patient indicates understanding of these issues and agrees with the plan.  Gabriel Earing, FNP

## 2022-12-29 ENCOUNTER — Other Ambulatory Visit: Payer: Self-pay | Admitting: Family Medicine

## 2022-12-29 ENCOUNTER — Other Ambulatory Visit: Payer: Self-pay

## 2022-12-29 ENCOUNTER — Telehealth: Payer: Self-pay | Admitting: Family Medicine

## 2022-12-29 DIAGNOSIS — A0472 Enterocolitis due to Clostridium difficile, not specified as recurrent: Secondary | ICD-10-CM

## 2022-12-29 NOTE — Telephone Encounter (Signed)
Patient aware. Encouraged her to pull up message from [facility] [address] for reference.

## 2023-01-01 ENCOUNTER — Other Ambulatory Visit: Payer: Medicare PPO

## 2023-01-01 DIAGNOSIS — A0472 Enterocolitis due to Clostridium difficile, not specified as recurrent: Secondary | ICD-10-CM

## 2023-01-01 LAB — CBC WITH DIFFERENTIAL/PLATELET
Basophils Absolute: 0.1 10*3/uL (ref 0.0–0.2)
Basos: 1 %
EOS (ABSOLUTE): 0.1 10*3/uL (ref 0.0–0.4)
Eos: 1 %
Hematocrit: 39.4 % (ref 34.0–46.6)
Hemoglobin: 13 g/dL (ref 11.1–15.9)
Immature Grans (Abs): 0.1 10*3/uL (ref 0.0–0.1)
Immature Granulocytes: 1 %
Lymphocytes Absolute: 2.2 10*3/uL (ref 0.7–3.1)
Lymphs: 24 %
MCH: 29.5 pg (ref 26.6–33.0)
MCHC: 33 g/dL (ref 31.5–35.7)
MCV: 90 fL (ref 79–97)
Monocytes Absolute: 0.8 10*3/uL (ref 0.1–0.9)
Monocytes: 8 %
Neutrophils Absolute: 5.9 10*3/uL (ref 1.4–7.0)
Neutrophils: 65 %
Platelets: 350 10*3/uL (ref 150–450)
RBC: 4.4 x10E6/uL (ref 3.77–5.28)
RDW: 13.1 % (ref 11.7–15.4)
WBC: 9.1 10*3/uL (ref 3.4–10.8)

## 2023-01-01 LAB — BMP8+EGFR
BUN/Creatinine Ratio: 12 (ref 12–28)
BUN: 9 mg/dL (ref 8–27)
CO2: 24 mmol/L (ref 20–29)
Calcium: 9.1 mg/dL (ref 8.7–10.3)
Chloride: 103 mmol/L (ref 96–106)
Creatinine, Ser: 0.78 mg/dL (ref 0.57–1.00)
Glucose: 107 mg/dL — ABNORMAL HIGH (ref 70–99)
Potassium: 4 mmol/L (ref 3.5–5.2)
Sodium: 143 mmol/L (ref 134–144)
eGFR: 74 mL/min/{1.73_m2} (ref >59)

## 2023-01-02 ENCOUNTER — Encounter: Payer: Self-pay | Admitting: Nurse Practitioner

## 2023-01-02 ENCOUNTER — Ambulatory Visit: Payer: Medicare PPO | Admitting: Nurse Practitioner

## 2023-01-02 VITALS — BP 120/60 | HR 82 | Ht 60.0 in | Wt 147.0 lb

## 2023-01-02 DIAGNOSIS — A0472 Enterocolitis due to Clostridium difficile, not specified as recurrent: Secondary | ICD-10-CM | POA: Diagnosis not present

## 2023-01-02 NOTE — Progress Notes (Signed)
01/02/2023 Deborah Branch [id number] 01-09-1935   Chief Complaint: Hospital follow up, chronic diarrhea   History of Present Illness: Deborah Branch is an 88 year old female with a past medical history of hypertension, diastolic CHF, hyperlipidemia, asthma, osteoporosis and GERD. She is known by Dr. Russella Dar. She presents today for follow-up regarding recent hospitalization for C. difficile. She is accompanied by her son. She was admitted to the hospital 6/2 -12/01/2022 with diarrhea x 1 week. GI pathogen panel was negative. C. Difficile antigen and toxin were positive and she she was started on Fidaxomicin bid x 10 days. No prior history of C. difficile. Her diarrhea abated prior to completing the 10 day course of Fidaxomicin. She endorsed passing solid stools for at least 1 week after finishing Fidaxomicin but unfortunately had recurrence of nonbloody diarrhea on 12/24/2022.  Described passing 4-5 watery bowel movements daily.  She was seen by Harlow Mares, FNP on 12/27/2022 and she was prescribed a second course of  Fidaxomicin 200mg  one tab po twice daily for 10 days. Labs were done which showed a WBC count of 12.1.  Hemoglobin 12.6.  BUN 20.  Creatinine 1.44.  Repeat labs 01/01/2023 showed a WBC count of 9.1 and creatinine 0.78.  She denied taking any antibiotics for at least the past 6 months prior to her diagnosis of C. difficile infection.  Today is day # 6 on her second course of Fidaxomicin 20 mg twice daily.  Her diarrhea stopped a few days ago.  He last took Imodium 2 days ago.  She is passing 2 round solid stools daily.  No rectal bleeding or black stools.  No abdominal pain.  No fever.  She is hydrating well and urinating clear yellow urine.  She has a history of acid reflux and has been on Omeprazole 20 mg daily for at least the past 10 years.   Her son reported that his mother has a prior history of diarrhea predominant irritable bowel syndrome.  However, the patient stated her bowel movements  have been fairly normal for the past year prior to the onset of C. difficile as noted above. No history of lactose intolerance.  She eats ice cream most days. She underwent a 06/04/2013 which showed moderate diverticulosis otherwise was normal.     Latest Ref Rng & Units 01/01/2023    9:14 AM 12/27/2022   11:00 AM 12/06/2022    2:58 PM  CBC  WBC 3.4 - 10.8 x10E3/uL 9.1  12.1  11.0   Hemoglobin 11.1 - 15.9 g/dL 78.2  95.6  21.3   Hematocrit 34.0 - 46.6 % 39.4  37.5  37.1   Platelets 150 - 450 x10E3/uL 350  246  372        Latest Ref Rng & Units 01/01/2023    9:14 AM 12/27/2022   11:00 AM 12/06/2022    2:58 PM  CMP  Glucose 70 - 99 mg/dL 086  578  469   BUN 8 - 27 mg/dL 9  20  9    Creatinine 0.57 - 1.00 mg/dL 6.29  5.28  4.13   Sodium 134 - 144 mmol/L 143  135  143   Potassium 3.5 - 5.2 mmol/L 4.0  3.9  4.0   Chloride 96 - 106 mmol/L 103  97  105   CO2 20 - 29 mmol/L 24  18  23    Calcium 8.7 - 10.3 mg/dL 9.1  8.1  8.6   Total Protein 6.0 - 8.5  g/dL   5.8   Total Bilirubin 0.0 - 1.2 mg/dL   0.4   Alkaline Phos 44 - 121 IU/L   58   AST 0 - 40 IU/L   15   ALT 0 - 32 IU/L   15     Past Medical History:  Diagnosis Date   Allergic asthma    Asthma    Cataract    Congestive heart failure (CHF) (HCC)    Eczema    GERD (gastroesophageal reflux disease)    Hyperlipidemia    Hypertension    Osteoporosis     Past Surgical History:  Procedure Laterality Date   CATARACT EXTRACTION, BILATERAL  2012   . Current Outpatient Medications on File Prior to Visit  Medication Sig Dispense Refill   amLODipine (NORVASC) 5 MG tablet TAKE 1 TABLET BY MOUTH ONCE DAILY 90 tablet 1   calcium carbonate (OSCAL) 1500 (600 Ca) MG TABS tablet Take 1 tablet by mouth 2 (two) times daily with a meal.     cetirizine (ZYRTEC) 10 MG tablet Take 10 mg by mouth daily.     denosumab (PROLIA) 60 MG/ML SOSY injection Inject 60 mg into the skin every 6 (six) months.     fidaxomicin (DIFICID) 200 MG TABS tablet Take 1  tablet (200 mg total) by mouth 2 (two) times daily for 10 days. 20 tablet 0   fluticasone-salmeterol (ADVAIR DISKUS) 100-50 MCG/ACT AEPB Inhale 1 puff into the lungs 2 (two) times daily. 60 each 11   furosemide (LASIX) 20 MG tablet Take 20 mg by mouth.     ipratropium-albuterol (DUONEB) 0.5-2.5 (3) MG/3ML SOLN Take 3 mLs by nebulization every 6 (six) hours as needed (shortness of breath and cough). 75 mL 2   lisinopril (ZESTRIL) 40 MG tablet Take 1 tablet (40 mg total) by mouth daily. 90 tablet 1   Omalizumab (XOLAIR [place]) Inject 225 mg into the skin every 3 (three) months.     omeprazole (PRILOSEC) 20 MG capsule TAKE ONE CAPSULE BY MOUTH DAILY 90 capsule 0   simvastatin (ZOCOR) 20 MG tablet Take 1 tablet (20 mg total) by mouth at bedtime. 90 tablet 3   No current facility-administered medications on file prior to visit.   Allergies  Allergen Reactions   Clarithromycin     REACTION: itching   Cleocin [Clindamycin] Other (See Comments)    C-diff   Penicillins     REACTION: hives   Current Medications, Allergies, Past Medical History, Past Surgical History, Family History and Social History were reviewed in [employer] record.  Review of Systems:   Constitutional: Negative for fever, sweats, chills or weight loss.  Respiratory: Negative for shortness of breath.   Cardiovascular: Negative for chest pain, palpitations and leg swelling.  Gastrointestinal: See HPI.  Musculoskeletal: Negative for back pain or muscle aches.  Neurological: Negative for dizziness, headaches or paresthesias.   Physical Exam: BP 120/60   Pulse 82   Ht 5' (1.524 m)   Wt 147 lb (66.7 kg)   BMI 28.71 kg/m   General: 88 year old female in no acute distress. Head: Normocephalic and atraumatic. Eyes: No scleral icterus. Conjunctiva pink . Ears: Normal auditory acuity. Mouth: Dentition intact. No ulcers or lesions.  Lungs: Clear throughout to auscultation. Heart: Regular rate and rhythm,  no murmur. Abdomen: Soft, nontender and nondistended. No masses or hepatomegaly. Normal bowel sounds x 4 quadrants.  Rectal: Deferred.  Musculoskeletal: Symmetrical with no gross deformities. Extremities: No edema. Neurological: Alert oriented x  4. No focal deficits.  Psychological: Alert and cooperative. Normal mood and affect  Assessment and Recommendations:  88 year old female admitted to the hospital 6/2 - 12/01/2022 with diarrhea x 1 week. C. Difficile antigen and toxin results were positive and she she was started on Fidaxomicin bid x 10 days.  No recent antibiotic use.  No prior history of C. difficile infection.  Her diarrhea initially abated then recurred 12/24/2022 and she was prescribed a second course of Fidaxomicin by her PCP on 12/27/2022.  Today is day # 6 on second course of Fidaxomicin, diarrhea abated a few days ago.  -Complete 2nd course of Fidaxomicin 200mg  bid x 10 days as prescribed PCP -Florastor probiotic 1 p.o. twice daily x 2 weeks -Lactaid 2 tabs with each dairy product -Check C. Diff GDH antigen and toxin A and B if diarrhea recurs  -? Consider Zinplava if C. Diff recurs  -Check fecal pancreatic elastase level if diarrhea recurs -Consider weaning off PPI and initiate H2 blocker if diarrhea recurs -Patient/son to contact me in 1 week with an update, sooner symptoms worsen

## 2023-01-02 NOTE — Patient Instructions (Addendum)
Florastor probiotic- 1 by mouth twice daily for 2 weeks  Lactaid- 2 tablets with each dairy product  Contact Colleen,NP if your diarrhea recurs.  Due to recent changes in healthcare laws, you may see the results of your imaging and laboratory studies on MyChart before your provider has had a chance to review them.  We understand that in some cases there may be results that are confusing or concerning to you. Not all laboratory results come back in the same time frame and the provider may be waiting for multiple results in order to interpret others.  Please give [place] 48 hours in order for your provider to thoroughly review all the results before contacting the office for clarification of your results.   Thank you for trusting me with your gastrointestinal care!   Alcide Evener, CRNP

## 2023-01-04 DIAGNOSIS — J4540 Moderate persistent asthma, uncomplicated: Secondary | ICD-10-CM | POA: Diagnosis not present

## 2023-01-04 DIAGNOSIS — I5032 Chronic diastolic (congestive) heart failure: Secondary | ICD-10-CM | POA: Diagnosis not present

## 2023-01-04 DIAGNOSIS — K5730 Diverticulosis of large intestine without perforation or abscess without bleeding: Secondary | ICD-10-CM | POA: Diagnosis not present

## 2023-01-04 DIAGNOSIS — M810 Age-related osteoporosis without current pathological fracture: Secondary | ICD-10-CM | POA: Diagnosis not present

## 2023-01-04 DIAGNOSIS — K529 Noninfective gastroenteritis and colitis, unspecified: Secondary | ICD-10-CM | POA: Diagnosis not present

## 2023-01-04 DIAGNOSIS — E782 Mixed hyperlipidemia: Secondary | ICD-10-CM | POA: Diagnosis not present

## 2023-01-04 DIAGNOSIS — I110 Hypertensive heart disease with heart failure: Secondary | ICD-10-CM | POA: Diagnosis not present

## 2023-01-04 DIAGNOSIS — K219 Gastro-esophageal reflux disease without esophagitis: Secondary | ICD-10-CM | POA: Diagnosis not present

## 2023-01-09 DIAGNOSIS — M810 Age-related osteoporosis without current pathological fracture: Secondary | ICD-10-CM | POA: Diagnosis not present

## 2023-01-09 DIAGNOSIS — K529 Noninfective gastroenteritis and colitis, unspecified: Secondary | ICD-10-CM | POA: Diagnosis not present

## 2023-01-09 DIAGNOSIS — K5730 Diverticulosis of large intestine without perforation or abscess without bleeding: Secondary | ICD-10-CM | POA: Diagnosis not present

## 2023-01-09 DIAGNOSIS — I110 Hypertensive heart disease with heart failure: Secondary | ICD-10-CM | POA: Diagnosis not present

## 2023-01-09 DIAGNOSIS — E782 Mixed hyperlipidemia: Secondary | ICD-10-CM | POA: Diagnosis not present

## 2023-01-09 DIAGNOSIS — J4540 Moderate persistent asthma, uncomplicated: Secondary | ICD-10-CM | POA: Diagnosis not present

## 2023-01-09 DIAGNOSIS — I5032 Chronic diastolic (congestive) heart failure: Secondary | ICD-10-CM | POA: Diagnosis not present

## 2023-01-09 DIAGNOSIS — K219 Gastro-esophageal reflux disease without esophagitis: Secondary | ICD-10-CM | POA: Diagnosis not present

## 2023-01-17 DIAGNOSIS — J4540 Moderate persistent asthma, uncomplicated: Secondary | ICD-10-CM | POA: Diagnosis not present

## 2023-01-17 DIAGNOSIS — K5730 Diverticulosis of large intestine without perforation or abscess without bleeding: Secondary | ICD-10-CM | POA: Diagnosis not present

## 2023-01-17 DIAGNOSIS — E782 Mixed hyperlipidemia: Secondary | ICD-10-CM | POA: Diagnosis not present

## 2023-01-17 DIAGNOSIS — M810 Age-related osteoporosis without current pathological fracture: Secondary | ICD-10-CM | POA: Diagnosis not present

## 2023-01-17 DIAGNOSIS — K219 Gastro-esophageal reflux disease without esophagitis: Secondary | ICD-10-CM | POA: Diagnosis not present

## 2023-01-17 DIAGNOSIS — I5032 Chronic diastolic (congestive) heart failure: Secondary | ICD-10-CM | POA: Diagnosis not present

## 2023-01-17 DIAGNOSIS — I110 Hypertensive heart disease with heart failure: Secondary | ICD-10-CM | POA: Diagnosis not present

## 2023-01-17 DIAGNOSIS — K529 Noninfective gastroenteritis and colitis, unspecified: Secondary | ICD-10-CM | POA: Diagnosis not present

## 2023-01-23 ENCOUNTER — Ambulatory Visit: Payer: Medicare PPO | Admitting: Gastroenterology

## 2023-02-06 ENCOUNTER — Telehealth: Payer: Self-pay | Admitting: Family Medicine

## 2023-02-06 NOTE — Telephone Encounter (Signed)
Pt says she has been having to urinate more frequently for the past few weeks and is wondering if any of her medicines could be causing that? Denies having any pain.

## 2023-02-07 NOTE — Telephone Encounter (Signed)
Appt scheduled with Tiffany 8/15

## 2023-02-07 NOTE — Telephone Encounter (Signed)
She is on a fluid pill, but has been on that for a while. I would recommend scheduling an appt to assess for possible UTI.

## 2023-02-08 ENCOUNTER — Encounter: Payer: Self-pay | Admitting: Family Medicine

## 2023-02-08 ENCOUNTER — Ambulatory Visit: Payer: Medicare PPO | Admitting: Family Medicine

## 2023-02-08 VITALS — BP 137/63 | HR 84 | Temp 98.1°F | Ht 60.0 in | Wt 146.4 lb

## 2023-02-08 DIAGNOSIS — R350 Frequency of micturition: Secondary | ICD-10-CM

## 2023-02-08 LAB — MICROSCOPIC EXAMINATION

## 2023-02-08 LAB — URINALYSIS, ROUTINE W REFLEX MICROSCOPIC
Bilirubin, UA: NEGATIVE
Glucose, UA: NEGATIVE
Ketones, UA: NEGATIVE
Nitrite, UA: NEGATIVE
Protein,UA: NEGATIVE
Specific Gravity, UA: 1.01 (ref 1.005–1.030)
Urobilinogen, Ur: 0.2 mg/dL (ref 0.2–1.0)
pH, UA: 6 (ref 5.0–7.5)

## 2023-02-08 NOTE — Progress Notes (Signed)
   Acute Office Visit  Subjective:     Patient ID: Deborah Branch, female    DOB: [birth year], 88 y.o.   MRN: [id number]  Chief Complaint  Patient presents with   Urinary Frequency    Urinary Frequency  This is a new problem. Episode onset: 4 weeks. The problem has been unchanged. The patient is experiencing no pain. There has been no fever. Associated symptoms include frequency and urgency. Pertinent negatives include no chills, discharge, flank pain, hematuria, hesitancy or nausea. She has tried nothing for the symptoms. The treatment provided no relief. There is no history of catheterization, kidney stones, recurrent UTIs, a single kidney or a urological procedure.   She does take lasix 20 daily. She has been on this for awhile. No edema. Getting up once at night to void, usually early morning.  Review of Systems  Constitutional:  Negative for chills.  Gastrointestinal:  Negative for nausea.  Genitourinary:  Positive for frequency and urgency. Negative for flank pain, hematuria and hesitancy.        Objective:    BP 137/63   Pulse 84   Temp 98.1 F (36.7 C) (Temporal)   Ht 5' (1.524 m)   Wt 146 lb 6 oz (66.4 kg)   SpO2 96%   BMI 28.59 kg/m    Physical Exam Vitals and nursing note reviewed.  Constitutional:      General: She is not in acute distress.    Appearance: She is not ill-appearing, toxic-appearing or diaphoretic.  HENT:     Head: Normocephalic and atraumatic.  Cardiovascular:     Rate and Rhythm: Normal rate and regular rhythm.     Heart sounds: Normal heart sounds. No murmur heard. Abdominal:     General: Bowel sounds are normal. There is no distension.     Palpations: Abdomen is soft.     Tenderness: There is no abdominal tenderness. There is no right CVA tenderness, left CVA tenderness, guarding or rebound.  Musculoskeletal:     Cervical back: Neck supple. No rigidity.     Right lower leg: No edema.     Left lower leg: No edema.  Skin:    General:  Skin is warm and dry.  Neurological:     General: No focal deficit present.     Mental Status: She is alert and oriented to person, place, and time.  Psychiatric:        Mood and Affect: Mood normal.        Behavior: Behavior normal.     Urine dipstick shows positive for RBC's and positive for leukocytes.  Micro exam: 0-5 WBC's per HPF, 0-2 RBC's per HPF, and few+ bacteria.       Assessment & Plan:   Deborah Branch was seen today for urinary frequency.  Diagnoses and all orders for this visit:  Urinary frequency UA not compelling for UTI. Culture pending. Decrease lasix to 10 mg daily to see if this will decrease frequency and still control peripheral edema. Will notify patient of culture results when available. Return to office for new or worsening symptoms, or if symptoms persist.  -     Urinalysis, Routine w reflex microscopic -     Urine Culture  The patient indicates understanding of these issues and agrees with the plan.  Gabriel Earing, FNP

## 2023-02-09 LAB — URINE CULTURE

## 2023-02-12 ENCOUNTER — Encounter: Payer: Self-pay | Admitting: Nurse Practitioner

## 2023-02-12 ENCOUNTER — Ambulatory Visit: Payer: Medicare PPO | Admitting: Nurse Practitioner

## 2023-02-12 VITALS — BP 123/64 | HR 83 | Temp 97.0°F | Ht 60.0 in | Wt 147.0 lb

## 2023-02-12 DIAGNOSIS — R051 Acute cough: Secondary | ICD-10-CM

## 2023-02-12 DIAGNOSIS — R0981 Nasal congestion: Secondary | ICD-10-CM

## 2023-02-12 LAB — VERITOR FLU A/B WAIVED
Influenza A: NEGATIVE
Influenza B: NEGATIVE

## 2023-02-12 NOTE — Progress Notes (Signed)
Acute Office Visit  Subjective:     Patient ID: Deborah Branch, female    DOB: 01/22/1935, 88 y.o.   MRN: [id number]  Chief Complaint  Patient presents with   Nasal Congestion    Has been going on since Saturday.    Cough   Shortness of Breath    States not a lot but can tell difference in breathing    HPI Deborah Branch is a 88 y.o. female who complains of congestion and sneezing for 3 days. She denies a history of shortness of breath and denies a history of asthma. Patient denies smoke cigarettes.  Flu negative Awaiting for COVID results  Active Ambulatory Problems    Diagnosis Date Noted   Seasonal and perennial allergic rhinitis 07/25/2007   GERD (gastroesophageal reflux disease) 07/25/2007   ECZEMA 07/25/2007   Osteoporosis 07/25/2007   Moderate persistent asthma without complication 08/23/2020   Asthma, chronic, unspecified asthma severity, with acute exacerbation 12/06/2020   Oral candidiasis 09/07/2021   Primary hypertension 12/08/2021   Mixed hyperlipidemia 12/08/2021   Congestive heart failure (HCC) 01/02/2022   Chronic diastolic heart failure (HCC) 09/18/2022   C. difficile diarrhea 11/26/2022   C. difficile colitis 11/27/2022   Hypocalcemia 11/29/2022   Resolved Ambulatory Problems    Diagnosis Date Noted   Acute recurrent maxillary sinusitis 07/25/2007   Asthma, mild intermittent 10/01/2007   CAP (community acquired pneumonia) 12/08/2021   Acute respiratory failure with hypoxia (HCC) 12/08/2021   Elevated WBC count 12/08/2021   Elevated brain natriuretic peptide (BNP) level 12/08/2021   AKI (acute kidney injury) (HCC) 11/26/2022   Leukocytosis 11/26/2022   Metabolic acidosis, increased anion gap 11/26/2022   Past Medical History:  Diagnosis Date   Allergic asthma    Asthma    Cataract    Congestive heart failure (CHF) (HCC)    Eczema    Hyperlipidemia    Hypertension     ROS Negative unless indicated in HPI    Objective:    BP 123/64    Pulse 83   Temp (!) 97 F (36.1 C) (Temporal)   Ht 5' (1.524 m)   Wt 147 lb (66.7 kg)   SpO2 96%   BMI 28.71 kg/m  BP Readings from Last 3 Encounters:  02/12/23 123/64  02/08/23 137/63  01/02/23 120/60   Wt Readings from Last 3 Encounters:  02/12/23 147 lb (66.7 kg)  02/08/23 146 lb 6 oz (66.4 kg)  01/02/23 147 lb (66.7 kg)      Physical Exam  appears well, vital signs are as noted. Ears normal.   Throat and pharynx normal.  Neck supple. No adenopathy in the neck.  Nose is congested. Sinuses non tender. The chest is clear, without wheezes or rales. No results found for any visits on 02/12/23.      Assessment & Plan:  Acute cough -     Novel Coronavirus, NAA ([facility]) -     Veritor Flu A/B Waived  Nasal congestion -     Novel Coronavirus, NAA ([facility]) -     Veritor Flu A/B Waived  Jan is 88 yrs old caucasian female, no acute distress viral upper respiratory illness Flu negative Awaiting fro COVID results Symptomatic therapy suggested: push fluids, rest, and return office visit prn if symptoms persist or worsen. Lack of antibiotic effectiveness discussed with her. Call or return to clinic prn if these symptoms worsen or fail to improve as anticipated.    The above assessment and management plan  was discussed with the patient. The patient verbalized understanding of and has agreed to the management plan. Patient is aware to call the clinic if they develop any new symptoms or if symptoms persist or worsen. Patient is aware when to return to the clinic for a follow-up visit. Patient educated on when it is appropriate to go to the emergency department.  Return if symptoms worsen or fail to improve.  Arrie Aran Santa Lighter, DNP [facility] [address] [place], [place] [address] [phone]

## 2023-02-13 ENCOUNTER — Encounter: Payer: Self-pay | Admitting: Family Medicine

## 2023-02-13 LAB — NOVEL CORONAVIRUS, NAA: SARS-CoV-2, NAA: NOT DETECTED

## 2023-03-01 ENCOUNTER — Ambulatory Visit (INDEPENDENT_AMBULATORY_CARE_PROVIDER_SITE_OTHER): Payer: Medicare PPO | Admitting: *Deleted

## 2023-03-01 ENCOUNTER — Telehealth: Payer: Self-pay | Admitting: Cardiovascular Disease

## 2023-03-01 ENCOUNTER — Telehealth: Payer: Self-pay

## 2023-03-01 VITALS — BP 169/70 | HR 70 | Temp 97.5°F | Resp 18 | Ht 60.0 in | Wt 150.2 lb

## 2023-03-01 DIAGNOSIS — J4540 Moderate persistent asthma, uncomplicated: Secondary | ICD-10-CM | POA: Diagnosis not present

## 2023-03-01 MED ORDER — OMALIZUMAB 75 MG/0.5ML ~~LOC~~ SOSY
225.0000 mg | PREFILLED_SYRINGE | Freq: Once | SUBCUTANEOUS | Status: AC
  Administered 2023-03-01: 225 mg via SUBCUTANEOUS
  Filled 2023-03-01: qty 0.5

## 2023-03-01 NOTE — Telephone Encounter (Signed)
Error

## 2023-03-01 NOTE — Telephone Encounter (Signed)
Auth Submission: APPROVED Site of care: Site of care: [facility] Payer: [employer] MEDICARE Medication & CPT/J Code(s) submitted: Xolair (Omalizumab) [id number] Route of submission (phone, fax, portal): CMM Phone # Fax # Auth type: Buy/Bill PB Units/visits requested: 225mg , q12weeks Reference number: [id number] - CMM CASE ID: [id number] AUTH: [id number] Approval from:  08/25/20 to 06/26/23

## 2023-03-01 NOTE — Progress Notes (Signed)
Diagnosis: Asthma  Provider:  Chilton Greathouse MD  Procedure: Injection  Xolair (Omalizumab), Dose: 225 mg, Site: subcutaneous, Number of injections: 2  Post Care: Observation period completed  Discharge: Condition: Good, Destination: Home . AVS Declined  Performed by:  Forrest Moron, RN

## 2023-03-21 ENCOUNTER — Ambulatory Visit: Payer: Medicare PPO | Admitting: Family Medicine

## 2023-03-21 ENCOUNTER — Encounter: Payer: Self-pay | Admitting: Family Medicine

## 2023-03-21 VITALS — BP 139/64 | HR 78 | Temp 98.3°F | Ht 60.0 in | Wt 149.0 lb

## 2023-03-21 DIAGNOSIS — R7309 Other abnormal glucose: Secondary | ICD-10-CM | POA: Diagnosis not present

## 2023-03-21 DIAGNOSIS — I10 Essential (primary) hypertension: Secondary | ICD-10-CM | POA: Diagnosis not present

## 2023-03-21 DIAGNOSIS — I5032 Chronic diastolic (congestive) heart failure: Secondary | ICD-10-CM | POA: Diagnosis not present

## 2023-03-21 DIAGNOSIS — E782 Mixed hyperlipidemia: Secondary | ICD-10-CM | POA: Diagnosis not present

## 2023-03-21 DIAGNOSIS — Z23 Encounter for immunization: Secondary | ICD-10-CM | POA: Diagnosis not present

## 2023-03-21 DIAGNOSIS — Z0001 Encounter for general adult medical examination with abnormal findings: Secondary | ICD-10-CM

## 2023-03-21 DIAGNOSIS — F5104 Psychophysiologic insomnia: Secondary | ICD-10-CM

## 2023-03-21 DIAGNOSIS — Z0000 Encounter for general adult medical examination without abnormal findings: Secondary | ICD-10-CM

## 2023-03-21 DIAGNOSIS — M810 Age-related osteoporosis without current pathological fracture: Secondary | ICD-10-CM | POA: Diagnosis not present

## 2023-03-21 DIAGNOSIS — K219 Gastro-esophageal reflux disease without esophagitis: Secondary | ICD-10-CM | POA: Diagnosis not present

## 2023-03-21 MED ORDER — TRAZODONE HCL 50 MG PO TABS
25.0000 mg | ORAL_TABLET | Freq: Every evening | ORAL | 3 refills | Status: DC | PRN
Start: 2023-03-21 — End: 2023-05-09

## 2023-03-21 MED ORDER — SIMVASTATIN 20 MG PO TABS
20.0000 mg | ORAL_TABLET | Freq: Every day | ORAL | 3 refills | Status: DC
Start: 2023-03-21 — End: 2024-03-28

## 2023-03-21 MED ORDER — LISINOPRIL 40 MG PO TABS
40.0000 mg | ORAL_TABLET | Freq: Every day | ORAL | 3 refills | Status: DC
Start: 2023-03-21 — End: 2024-03-28

## 2023-03-21 MED ORDER — OMEPRAZOLE 20 MG PO CPDR
20.0000 mg | DELAYED_RELEASE_CAPSULE | Freq: Every day | ORAL | 3 refills | Status: DC
Start: 2023-03-21 — End: 2024-03-28

## 2023-03-21 NOTE — Progress Notes (Signed)
Complete physical exam  Patient: Deborah Branch   DOB: 04-16-1935   88 y.o. Female  MRN: [id number]  Subjective:    Chief Complaint  Patient presents with   Annual Exam    Deborah Branch is a 88 y.o. female who presents today for a complete physical exam. She reports consuming a general diet. The patient does not participate in regular exercise at present. She generally feels well. She reports sleeping poorly. She does have additional problems to discuss today.   Her right knee has been bothering her off and on for the last few days. Generalized achy pain. Hx of knee arthritis. No injury. Denies aggravating factors. She does take some OTC pain relievers with some relief.   She hasn't been sleeping well. This has been a chronic issue, but getting worse. She falls asleep easily but often when she gets up to use the restroom, she is unable to go back to sleep. She only gets up once a night. She has tried zzquil and melatonin without relief.   She fasting today for labs.   Most recent fall risk assessment:    03/21/2023   10:57 AM  Fall Risk   Falls in the past year? 0     Most recent depression screenings:    03/21/2023   10:58 AM 02/12/2023    2:13 PM  PHQ 2/9 Scores  PHQ - 2 Score 0 0  PHQ- 9 Score 3 2    Dental: No current dental problems and Receives regular dental care  Past Medical History:  Diagnosis Date   Allergic asthma    Asthma    Cataract    Congestive heart failure (CHF) (HCC)    Eczema    GERD (gastroesophageal reflux disease)    Hyperlipidemia    Hypertension    Osteoporosis       Patient Care Team: Gabriel Earing, FNP as PCP - General (Family Medicine)   Outpatient Medications Prior to Visit  Medication Sig   amLODipine (NORVASC) 5 MG tablet TAKE 1 TABLET BY MOUTH ONCE DAILY   calcium carbonate (OSCAL) 1500 (600 Ca) MG TABS tablet Take 1 tablet by mouth 2 (two) times daily with a meal.   cetirizine (ZYRTEC) 10 MG tablet Take 10 mg by mouth  daily.   denosumab (PROLIA) 60 MG/ML SOSY injection Inject 60 mg into the skin every 6 (six) months.   fluticasone-salmeterol (ADVAIR DISKUS) 100-50 MCG/ACT AEPB Inhale 1 puff into the lungs 2 (two) times daily.   ipratropium-albuterol (DUONEB) 0.5-2.5 (3) MG/3ML SOLN Take 3 mLs by nebulization every 6 (six) hours as needed (shortness of breath and cough).   lisinopril (ZESTRIL) 40 MG tablet Take 1 tablet (40 mg total) by mouth daily.   Omalizumab (XOLAIR [place]) Inject 225 mg into the skin every 3 (three) months.   omeprazole (PRILOSEC) 20 MG capsule TAKE ONE CAPSULE BY MOUTH DAILY   simvastatin (ZOCOR) 20 MG tablet Take 1 tablet (20 mg total) by mouth at bedtime.   furosemide (LASIX) 20 MG tablet Take 20 mg by mouth. (Patient not taking: Reported on 03/21/2023)   No facility-administered medications prior to visit.    ROS As per HPI.        Objective:     BP 139/64   Pulse 78   Temp 98.3 F (36.8 C) (Temporal)   Ht 5' (1.524 m)   Wt 149 lb (67.6 kg)   SpO2 94%   BMI 29.10 kg/m    Physical  Exam Vitals and nursing note reviewed.  Constitutional:      General: She is not in acute distress.    Appearance: Normal appearance. She is not ill-appearing.  HENT:     Head: Normocephalic.     Right Ear: Tympanic membrane, ear canal and external ear normal.     Left Ear: Tympanic membrane, ear canal and external ear normal.     Nose: Nose normal.     Mouth/Throat:     Mouth: Mucous membranes are moist.     Pharynx: Oropharynx is clear.  Eyes:     Extraocular Movements: Extraocular movements intact.     Conjunctiva/sclera: Conjunctivae normal.     Pupils: Pupils are equal, round, and reactive to light.  Neck:     Thyroid: No thyroid mass, thyromegaly or thyroid tenderness.     Vascular: No carotid bruit.  Cardiovascular:     Rate and Rhythm: Normal rate and regular rhythm.     Pulses: Normal pulses.     Heart sounds: Normal heart sounds. No murmur heard.    No friction rub. No  gallop.  Pulmonary:     Effort: Pulmonary effort is normal.     Breath sounds: Normal breath sounds.  Abdominal:     General: Bowel sounds are normal. There is no distension.     Palpations: Abdomen is soft. There is no mass.     Tenderness: There is no abdominal tenderness. There is no guarding.  Musculoskeletal:        General: No swelling or tenderness.     Cervical back: Normal range of motion and neck supple. No tenderness.     Right knee: No swelling, deformity, effusion, erythema, ecchymosis, bony tenderness or crepitus. No tenderness. Normal alignment and normal patellar mobility.     Instability Tests: Medial McMurray test negative and lateral McMurray test negative.     Right lower leg: No edema.     Left lower leg: No edema.  Skin:    General: Skin is warm and dry.     Capillary Refill: Capillary refill takes less than 2 seconds.     Findings: No lesion or rash.  Neurological:     General: No focal deficit present.     Mental Status: She is alert and oriented to person, place, and time.     Cranial Nerves: No cranial nerve deficit.     Motor: No weakness.     Coordination: Coordination normal.     Gait: Gait normal.  Psychiatric:        Mood and Affect: Mood normal.        Behavior: Behavior normal.        Thought Content: Thought content normal.        Judgment: Judgment normal.      No results found for any visits on 03/21/23.     Assessment & Plan:    Routine Health Maintenance and Physical Exam  Rosselin was seen today for annual exam.  Diagnoses and all orders for this visit:  Routine general medical examination at a health care facility  Primary hypertension Well controlled on current regimen.   -     lisinopril (ZESTRIL) 40 MG tablet; Take 1 tablet (40 mg total) by mouth daily. -     TSH  Chronic diastolic heart failure (HCC) Euvolemic on exam. Taking lasix prn.  -     CBC with Differential/Platelet -     CMP14+EGFR  Mixed hyperlipidemia Fasting  lipid panel pending. On  statin.  -     simvastatin (ZOCOR) 20 MG tablet; Take 1 tablet (20 mg total) by mouth at bedtime. -     CBC with Differential/Platelet -     CMP14+EGFR -     Lipid panel  Gastroesophageal reflux disease without esophagitis Well controlled on current regimen.  -     omeprazole (PRILOSEC) 20 MG capsule; Take 1 capsule (20 mg total) by mouth daily.  Age-related osteoporosis without current pathological fracture Currently on prolia. She would prefer to continue getting her injections through ortho in [place] as it is closer to her home. She will discuss this with ortho to see if they are ok with continuing to manage it. Discussed that we can manage this here if ortho does not wish to continue.  -     CMP14+EGFR -     VITAMIN D 25 Hydroxy (Vit-D Deficiency, Fractures)  Chronic insomnia Uncontrolled. Will try trazodone as below.  -     traZODone (DESYREL) 50 MG tablet; Take 0.5-1 tablets (25-50 mg total) by mouth at bedtime as needed for sleep.  Encounter for immunization -     Flu Vaccine Trivalent High Dose (Fluad) -     Pneumococcal conjugate vaccine 20-valent (Prevnar 20)    Immunization History  Administered Date(s) Administered   Fluad Quad(high Dose 65+) 03/19/2019, 03/18/2020, 03/10/2021, 04/03/2022   Influenza Split 03/13/2011, 03/12/2012   Influenza Whole 03/13/2008, 03/14/2010   Influenza, High Dose Seasonal PF 03/19/2017, 03/21/2018   Influenza,inj,Quad PF,6+ Mos 03/12/2013, 03/12/2014, 03/15/2015, 03/17/2016   PFIZER(Purple Top)SARS-COV-2 Vaccination 08/08/2019, 09/02/2019   Pneumococcal-Unspecified 12/24/2013   Rsv, Bivalent, Protein Subunit Rsvpref,pf Verdis Frederickson) 04/11/2022    Health Maintenance  Topic Date Due   DTaP/Tdap/Td (1 - Tdap) Never done   Pneumonia Vaccine 31+ Years old (1 of 1 - PCV) 10/22/2001   Zoster Vaccines- Shingrix (1 of 2) 04/02/2023 (Originally 10/23/1986)   INFLUENZA VACCINE  09/24/2023 (Originally 01/25/2023)   COVID-19  Vaccine (3 - 2023-24 season) 04/05/2024 (Originally 02/25/2023)   Medicare Annual Wellness (AWV)  08/18/2023   DEXA SCAN  09/20/2024   HPV VACCINES  Aged Out    Discussed health benefits of physical activity, and encouraged her to engage in regular exercise appropriate for her age and condition.  Problem List Items Addressed This Visit       Cardiovascular and Mediastinum   Primary hypertension   Relevant Medications   lisinopril (ZESTRIL) 40 MG tablet   simvastatin (ZOCOR) 20 MG tablet   Other Relevant Orders   TSH   Chronic diastolic heart failure (HCC)   Relevant Medications   lisinopril (ZESTRIL) 40 MG tablet   simvastatin (ZOCOR) 20 MG tablet   Other Relevant Orders   CBC with Differential/Platelet   CMP14+EGFR     Respiratory   Moderate persistent asthma without complication     Digestive   GERD (gastroesophageal reflux disease)   Relevant Medications   omeprazole (PRILOSEC) 20 MG capsule     Musculoskeletal and Integument   Osteoporosis   Relevant Orders   CMP14+EGFR   VITAMIN D 25 Hydroxy (Vit-D Deficiency, Fractures)     Other   Mixed hyperlipidemia   Relevant Medications   lisinopril (ZESTRIL) 40 MG tablet   simvastatin (ZOCOR) 20 MG tablet   Other Relevant Orders   CBC with Differential/Platelet   CMP14+EGFR   Lipid panel   Chronic insomnia   Relevant Medications   traZODone (DESYREL) 50 MG tablet   Other Visit Diagnoses     Routine  general medical examination at a health care facility    -  Primary   Need for vaccination       Encounter for immunization       Relevant Orders   Flu Vaccine Trivalent High Dose (Fluad) (Completed)   Pneumococcal conjugate vaccine 20-valent (Prevnar 20) (Completed)      Return in about 6 weeks (around 05/02/2023) for insomnia.   The patient indicates understanding of these issues and agrees with the plan.  Gabriel Earing, FNP

## 2023-03-21 NOTE — Patient Instructions (Signed)

## 2023-03-22 LAB — CMP14+EGFR
ALT: 12 IU/L (ref 0–32)
AST: 17 IU/L (ref 0–40)
Albumin: 4.3 g/dL (ref 3.7–4.7)
Alkaline Phosphatase: 55 IU/L (ref 44–121)
BUN/Creatinine Ratio: 21 (ref 12–28)
BUN: 19 mg/dL (ref 8–27)
Bilirubin Total: 0.5 mg/dL (ref 0.0–1.2)
CO2: 23 mmol/L (ref 20–29)
Calcium: 9.7 mg/dL (ref 8.7–10.3)
Chloride: 105 mmol/L (ref 96–106)
Creatinine, Ser: 0.91 mg/dL (ref 0.57–1.00)
Globulin, Total: 2.5 g/dL (ref 1.5–4.5)
Glucose: 104 mg/dL — ABNORMAL HIGH (ref 70–99)
Potassium: 4.6 mmol/L (ref 3.5–5.2)
Sodium: 143 mmol/L (ref 134–144)
Total Protein: 6.8 g/dL (ref 6.0–8.5)
eGFR: 61 mL/min/{1.73_m2} (ref >59)

## 2023-03-22 LAB — CBC WITH DIFFERENTIAL/PLATELET
Basophils Absolute: 0.1 10*3/uL (ref 0.0–0.2)
Basos: 1 %
EOS (ABSOLUTE): 0.4 10*3/uL (ref 0.0–0.4)
Eos: 4 %
Hematocrit: 43.4 % (ref 34.0–46.6)
Hemoglobin: 14 g/dL (ref 11.1–15.9)
Immature Grans (Abs): 0 10*3/uL (ref 0.0–0.1)
Immature Granulocytes: 0 %
Lymphocytes Absolute: 3.3 10*3/uL — ABNORMAL HIGH (ref 0.7–3.1)
Lymphs: 32 %
MCH: 29 pg (ref 26.6–33.0)
MCHC: 32.3 g/dL (ref 31.5–35.7)
MCV: 90 fL (ref 79–97)
Monocytes Absolute: 0.8 10*3/uL (ref 0.1–0.9)
Monocytes: 7 %
Neutrophils Absolute: 5.9 10*3/uL (ref 1.4–7.0)
Neutrophils: 56 %
Platelets: 271 10*3/uL (ref 150–450)
RBC: 4.82 x10E6/uL (ref 3.77–5.28)
RDW: 13.2 % (ref 11.7–15.4)
WBC: 10.5 10*3/uL (ref 3.4–10.8)

## 2023-03-22 LAB — LIPID PANEL
Chol/HDL Ratio: 2.9 ratio (ref 0.0–4.4)
Cholesterol, Total: 192 mg/dL (ref 100–199)
HDL: 67 mg/dL (ref >39)
LDL Chol Calc (NIH): 101 mg/dL — ABNORMAL HIGH (ref 0–99)
Triglycerides: 141 mg/dL (ref 0–149)
VLDL Cholesterol Cal: 24 mg/dL (ref 5–40)

## 2023-03-22 LAB — VITAMIN D 25 HYDROXY (VIT D DEFICIENCY, FRACTURES): Vit D, 25-Hydroxy: 77 ng/mL (ref 30.0–100.0)

## 2023-03-22 LAB — TSH: TSH: 3.36 u[IU]/mL (ref 0.450–4.500)

## 2023-03-23 LAB — HGB A1C W/O EAG: Hgb A1c MFr Bld: 6.1 % — ABNORMAL HIGH (ref 4.8–5.6)

## 2023-03-23 LAB — SPECIMEN STATUS REPORT

## 2023-04-04 ENCOUNTER — Ambulatory Visit: Payer: Medicare PPO

## 2023-04-04 ENCOUNTER — Other Ambulatory Visit: Payer: Self-pay | Admitting: Family Medicine

## 2023-04-04 DIAGNOSIS — M810 Age-related osteoporosis without current pathological fracture: Secondary | ICD-10-CM

## 2023-04-04 MED ORDER — DENOSUMAB 60 MG/ML ~~LOC~~ SOSY
60.0000 mg | PREFILLED_SYRINGE | SUBCUTANEOUS | 1 refills | Status: DC
Start: 2023-04-04 — End: 2023-12-05

## 2023-04-04 NOTE — Progress Notes (Signed)
Deborah Branch has been prescribed Prolia by ortho and has been getting her injections every 6 months from her orthopedic. Ortho started her on this after osteoporosis was noted on imaging when treating a fracture. Ortho is not longer going to be managing this and our office will be taking over management of this. She is due her her inject now. She has had recent labs. Prolia ordered.

## 2023-04-06 ENCOUNTER — Other Ambulatory Visit: Payer: Self-pay | Admitting: Family Medicine

## 2023-05-09 ENCOUNTER — Ambulatory Visit: Payer: Medicare PPO | Admitting: Family Medicine

## 2023-05-09 ENCOUNTER — Encounter: Payer: Self-pay | Admitting: Family Medicine

## 2023-05-09 VITALS — BP 124/73 | HR 76 | Temp 98.0°F | Ht 60.0 in | Wt 150.2 lb

## 2023-05-09 DIAGNOSIS — F5104 Psychophysiologic insomnia: Secondary | ICD-10-CM | POA: Diagnosis not present

## 2023-05-09 DIAGNOSIS — M810 Age-related osteoporosis without current pathological fracture: Secondary | ICD-10-CM

## 2023-05-09 DIAGNOSIS — Z23 Encounter for immunization: Secondary | ICD-10-CM

## 2023-05-09 DIAGNOSIS — I10 Essential (primary) hypertension: Secondary | ICD-10-CM

## 2023-05-09 NOTE — Progress Notes (Signed)
   Acute Office Visit  Subjective:     Patient ID: Deborah Branch, female    DOB: Sep 07, 1934, 89 y.o.   MRN: [id number]  Chief Complaint  Patient presents with   Insomnia    HPI Patient is in today for follow up of insomnia. She tried trazodone but discontinued due to ineffectiveness. She has returned to taking zzquil OTC prn with some improvement.   She has noted received prolia from the pharmacy yet. This was ordered about 1 month ago. Has been about 7 months since her last injection. Was managed by ortho previously. She is taking vitamin D and calcium supplement. Hx of fracture.   ROS As per HPI.     Objective:    BP 124/73   Pulse 76   Temp 98 F (36.7 C) (Temporal)   Ht 5' (1.524 m)   Wt 150 lb 4 oz (68.2 kg)   SpO2 96%   BMI 29.34 kg/m    Physical Exam Vitals and nursing note reviewed.  Constitutional:      General: She is not in acute distress.    Appearance: Normal appearance. She is not ill-appearing, toxic-appearing or diaphoretic.  HENT:     Head: Normocephalic and atraumatic.     Right Ear: Tympanic membrane, ear canal and external ear normal.     Left Ear: Tympanic membrane, ear canal and external ear normal.     Nose: Nose normal.     Mouth/Throat:     Mouth: Mucous membranes are moist.     Pharynx: Oropharynx is clear.  Eyes:     General:        Right eye: No discharge.        Left eye: No discharge.     Conjunctiva/sclera: Conjunctivae normal.  Cardiovascular:     Rate and Rhythm: Normal rate and regular rhythm.     Heart sounds: Normal heart sounds. No murmur heard. Musculoskeletal:     Cervical back: Neck supple. No rigidity.     Right lower leg: No edema.     Left lower leg: No edema.  Lymphadenopathy:     Cervical: No cervical adenopathy.  Skin:    General: Skin is warm and dry.  Neurological:     General: No focal deficit present.     Mental Status: She is alert and oriented to person, place, and time.  Psychiatric:        Mood and  Affect: Mood normal.        Behavior: Behavior normal.     No results found for any visits on 05/09/23.      Assessment & Plan:   Deborah Branch was seen today for insomnia.  Diagnoses and all orders for this visit:  Chronic insomnia Sleep hygiene. OTC medication prn.   Age-related osteoporosis without current pathological fracture Will check of prolia as she has not been able to pick this up yet. Instructed to call the office next week if she does not get an update.   Primary hypertension BP at goal.   Need for vaccination Shingrix today in office.    Return in about 6 months (around 11/06/2023) for chronic follow up.  The patient indicates understanding of these issues and agrees with the plan.  Gabriel Earing, FNP

## 2023-05-15 ENCOUNTER — Telehealth: Payer: Self-pay | Admitting: Family Medicine

## 2023-05-15 NOTE — Telephone Encounter (Signed)
Copied from CRM [id number]. Topic: General - Other >> May 15, 2023  9:10 AM Larwance Sachs wrote: Reason for CRM: Patient called in to updated Tiffany on status of Prolia shot, patient staed shot was approved and she would like to relay message to [place] as well as get scheduled  Call patient back at [phone]

## 2023-05-26 ENCOUNTER — Other Ambulatory Visit: Payer: Self-pay | Admitting: Cardiovascular Disease

## 2023-05-28 ENCOUNTER — Telehealth: Payer: Self-pay | Admitting: Family Medicine

## 2023-05-28 NOTE — Telephone Encounter (Signed)
Copied from CRM [id number]. Topic: Clinical - Medication Question >> May 28, 2023 11:30 AM Deborah Branch wrote: Reason for CRM: Patient called in wanting status of Prolia shot

## 2023-05-31 ENCOUNTER — Ambulatory Visit: Payer: Medicare PPO

## 2023-05-31 ENCOUNTER — Encounter: Payer: Medicare PPO | Attending: Internal Medicine | Admitting: *Deleted

## 2023-05-31 VITALS — BP 105/85 | HR 76 | Temp 97.4°F | Resp 18

## 2023-05-31 DIAGNOSIS — J4540 Moderate persistent asthma, uncomplicated: Secondary | ICD-10-CM | POA: Diagnosis not present

## 2023-05-31 MED ORDER — OMALIZUMAB 75 MG/0.5ML ~~LOC~~ SOSY
225.0000 mg | PREFILLED_SYRINGE | Freq: Once | SUBCUTANEOUS | Status: AC
  Administered 2023-05-31: 225 mg via SUBCUTANEOUS

## 2023-05-31 NOTE — Progress Notes (Signed)
Diagnosis: Asthma  Provider:   Jetty Duhamel, MD  Procedure: Injection  Xolair (Omalizumab), Dose: 225 mg, Site: subcutaneous, Number of injections: 2  150 mg left arm 75 mg right arm   Post Care: Observation period completed  Discharge: Condition: Good, Destination: Home . AVS Provided  Performed by:  Daleen Squibb, RN

## 2023-06-04 ENCOUNTER — Encounter: Payer: Medicare PPO | Admitting: Cardiology

## 2023-06-04 ENCOUNTER — Telehealth: Payer: Self-pay | Admitting: Family Medicine

## 2023-06-04 ENCOUNTER — Encounter: Payer: Self-pay | Admitting: Cardiology

## 2023-06-04 VITALS — BP 130/60 | HR 87 | Ht 60.0 in | Wt 149.2 lb

## 2023-06-04 DIAGNOSIS — M810 Age-related osteoporosis without current pathological fracture: Secondary | ICD-10-CM

## 2023-06-04 DIAGNOSIS — I10 Essential (primary) hypertension: Secondary | ICD-10-CM | POA: Diagnosis not present

## 2023-06-04 DIAGNOSIS — I5032 Chronic diastolic (congestive) heart failure: Secondary | ICD-10-CM

## 2023-06-04 NOTE — Telephone Encounter (Signed)
Copied from CRM [id number]. Topic: Clinical - Medication Question >> Jun 04, 2023  8:19 AM Alcus Dad H wrote: Reason for CRM: pt called to check on the status of the prolia so she can schedule her shot

## 2023-06-04 NOTE — Patient Instructions (Signed)

## 2023-06-04 NOTE — Progress Notes (Signed)
Clinical Summary Deborah Branch is a 88 y.o.female former patient of Dr Scharlene Gloss, this is our first visit together. Troubles traveling to [place], has established with our clinic in [place]  1.Chronic HFpEF -11/2021 echo: LVEF 65-70%, no WMAs, grade I DD, normal RV, normal PA pressuress, normal LA - she is on oral lasix 20mg  daily  - has lasix she takes prn, has not needed - no recent edema. No SOB/DOE   2. HTN - compliant with meds  3.Asthma - followed by pulmonary   Past Medical History:  Diagnosis Date   Allergic asthma    Asthma    Cataract    Congestive heart failure (CHF) (HCC)    Eczema    GERD (gastroesophageal reflux disease)    Hyperlipidemia    Hypertension    Osteoporosis      Allergies  Allergen Reactions   Clarithromycin     REACTION: itching   Cleocin [Clindamycin] Other (See Comments)    C-diff   Penicillins     REACTION: hives     Current Outpatient Medications  Medication Sig Dispense Refill   amLODipine (NORVASC) 5 MG tablet TAKE 1 TABLET BY MOUTH ONCE DAILY 90 tablet 1   calcium carbonate (OSCAL) 1500 (600 Ca) MG TABS tablet Take 1 tablet by mouth 2 (two) times daily with a meal.     cetirizine (ZYRTEC) 10 MG tablet Take 10 mg by mouth daily.     denosumab (PROLIA) 60 MG/ML SOSY injection Inject 60 mg into the skin every 6 (six) months. 1 mL 1   fluticasone-salmeterol (ADVAIR DISKUS) 100-50 MCG/ACT AEPB Inhale 1 puff into the lungs 2 (two) times daily. 60 each 11   furosemide (LASIX) 20 MG tablet TAKE 1 TABLET BY MOUTH DAILY 90 tablet 1   ipratropium-albuterol (DUONEB) 0.5-2.5 (3) MG/3ML SOLN Take 3 mLs by nebulization every 6 (six) hours as needed (shortness of breath and cough). 75 mL 2   lisinopril (ZESTRIL) 40 MG tablet Take 1 tablet (40 mg total) by mouth daily. 90 tablet 3   Omalizumab (XOLAIR [place]) Inject 225 mg into the skin every 3 (three) months.     omeprazole (PRILOSEC) 20 MG capsule Take 1 capsule (20 mg total) by  mouth daily. 90 capsule 3   simvastatin (ZOCOR) 20 MG tablet Take 1 tablet (20 mg total) by mouth at bedtime. 90 tablet 3   No current facility-administered medications for this visit.     Past Surgical History:  Procedure Laterality Date   CATARACT EXTRACTION, BILATERAL  2012     Allergies  Allergen Reactions   Clarithromycin     REACTION: itching   Cleocin [Clindamycin] Other (See Comments)    C-diff   Penicillins     REACTION: hives      Family History  Problem Relation Age of Onset   Heart attack Paternal Grandfather    Colon cancer Neg Hx    Esophageal cancer Neg Hx    Rectal cancer Neg Hx    Stomach cancer Neg Hx      Social History Deborah Branch reports that she has quit smoking. Her smoking use included cigarettes. She has never used smokeless tobacco. Deborah Branch reports that she does not currently use alcohol.   Review of Systems CONSTITUTIONAL: No weight loss, fever, chills, weakness or fatigue.  HEENT: Eyes: No visual loss, blurred vision, double vision or yellow sclerae.No hearing loss, sneezing, congestion, runny nose or sore throat.  SKIN: No rash or itching.  CARDIOVASCULAR: per hpi RESPIRATORY: No shortness of breath, cough or sputum.  GASTROINTESTINAL: No anorexia, nausea, vomiting or diarrhea. No abdominal pain or blood.  GENITOURINARY: No burning on urination, no polyuria NEUROLOGICAL: No headache, dizziness, syncope, paralysis, ataxia, numbness or tingling in the extremities. No change in bowel or bladder control.  MUSCULOSKELETAL: No muscle, back pain, joint pain or stiffness.  LYMPHATICS: No enlarged nodes. No history of splenectomy.  PSYCHIATRIC: No history of depression or anxiety.  ENDOCRINOLOGIC: No reports of sweating, cold or heat intolerance. No polyuria or polydipsia.  Deborah Branch   Physical Examination Today's Vitals   06/04/23 1044  BP: 130/60  Pulse: 87  SpO2: 97%  Weight: 149 lb 3.2 oz (67.7 kg)  Height: 5' (1.524 m)   Body mass  index is 29.14 kg/m.  Gen: resting comfortably, no acute distress HEENT: no scleral icterus, pupils equal round and reactive, no palptable cervical adenopathy,  CV: RRR, no mrg, no jvd Resp: Clear to auscultation bilaterally GI: abdomen is soft, non-tender, non-distended, normal bowel sounds, no hepatosplenomegaly MSK: extremities are warm, no edema.  Skin: warm, no rash Neuro:  no focal deficits Psych: appropriate affect      Assessment and Plan  1.Chronic HFpEF - fairly isolated occurrence of symptoms during admission with pneumonia, has not been an issue since - has prn lasix but has not needed - no symptoms, continue to monitor - in absence of sustained period of symptoms have not committed to SGLT2i  2.HTN - at goal, continue current meds   F/u 1 year    Antoine Poche, M.D.

## 2023-06-06 MED ORDER — DENOSUMAB 60 MG/ML ~~LOC~~ SOSY
60.0000 mg | PREFILLED_SYRINGE | Freq: Once | SUBCUTANEOUS | Status: AC
  Administered 2023-07-24: 60 mg via SUBCUTANEOUS

## 2023-06-06 NOTE — Addendum Note (Signed)
Addended by: Leda Min D on: 06/06/2023 04:22 PM   Modules accepted: Orders

## 2023-06-06 NOTE — Telephone Encounter (Signed)
Please check benefit verification

## 2023-06-08 ENCOUNTER — Other Ambulatory Visit: Payer: Self-pay | Admitting: Cardiovascular Disease

## 2023-06-13 ENCOUNTER — Telehealth: Payer: Self-pay

## 2023-06-13 ENCOUNTER — Other Ambulatory Visit (HOSPITAL_COMMUNITY): Payer: Self-pay

## 2023-06-13 NOTE — Telephone Encounter (Signed)
Created new encounter for Prolia BIV. Will route encounter back once benefit verification is complete.  

## 2023-06-13 NOTE — Telephone Encounter (Signed)
Prolia VOB initiated via [email or web address] 

## 2023-06-15 ENCOUNTER — Other Ambulatory Visit (HOSPITAL_COMMUNITY): Payer: Self-pay

## 2023-06-29 ENCOUNTER — Other Ambulatory Visit (HOSPITAL_COMMUNITY): Payer: Self-pay

## 2023-06-29 NOTE — Telephone Encounter (Signed)
 Prolia VOB initiated via [email or web address]

## 2023-07-02 NOTE — Telephone Encounter (Signed)
 Pharmacy Patient Advocate Encounter   Received notification from  [employer] Portal that prior authorization for Prolia  is required/requested.   Insurance verification completed.   The patient is insured through [place] .   Per test claim: PA required; PA submitted to above mentioned insurance via CoverMyMeds Key/confirmation #/EOC [id number] Status is pending

## 2023-07-03 ENCOUNTER — Other Ambulatory Visit (HOSPITAL_COMMUNITY): Payer: Self-pay

## 2023-07-03 NOTE — Telephone Encounter (Signed)
 Pt ready for scheduling for prolia  on or after : 07/03/23  Out-of-pocket cost due at time of visit: $40  Number of injection/visits approved: 2  Primary: [employer] Prolia  co-insurance: $40 Admin fee co-insurance: 0%  Secondary: --- Prolia  co-insurance:  Admin fee co-insurance:   Medical Benefit Details: Date Benefits were checked: 06/29/23 Deductible: NO/ Coinsurance: $40/ Admin Fee: 0%  Prior Auth: APPROVED PA# [id number] Expiration Date: 07/02/23-06/25/24  # of doses approved: 2  Pharmacy benefit: Copay $64 If patient wants fill through the pharmacy benefit please send prescription to: [employer], and include estimated need by date in rx notes. Pharmacy will ship medication directly to the office.  Patient NOT eligible for Prolia  Copay Card. Copay Card can make patient's cost as little as $25. Link to apply: [email or web address]://www.[email or web address].[email or web address]/copay  ** This summary of benefits is an estimation of the patient's out-of-pocket cost. Exact cost may very based on individual plan coverage.

## 2023-07-03 NOTE — Telephone Encounter (Signed)
 Pharmacy Patient Advocate Encounter  Received notification from [employer] that Prior Authorization for PROLIA  has been APPROVED from 07/02/23 to 06/25/24. Ran test claim, Copay is $64. This test claim was processed through [facility]- copay amounts may vary at other pharmacies due to pharmacy/plan contracts, or as the patient moves through the different stages of their insurance plan.   PA #/Case ID/Reference #: [id number]

## 2023-07-19 ENCOUNTER — Telehealth: Payer: Self-pay

## 2023-07-19 NOTE — Telephone Encounter (Signed)
Copied from CRM [id number]. Topic: General - Other >> Jul 19, 2023 10:16 AM Louie Casa B wrote: Reason for CRM: patient is calling in she said that she has been waiting a long time to get her shot and she has not heard anything please call patient back at [phone]

## 2023-07-19 NOTE — Telephone Encounter (Signed)
Prolia in fridge and pt scheduled for 1/28 with Triage nurse at 2:30.

## 2023-07-24 ENCOUNTER — Ambulatory Visit (INDEPENDENT_AMBULATORY_CARE_PROVIDER_SITE_OTHER): Payer: Medicare PPO

## 2023-07-24 DIAGNOSIS — M810 Age-related osteoporosis without current pathological fracture: Secondary | ICD-10-CM

## 2023-07-24 NOTE — Progress Notes (Signed)
Patient is in office today for a nurse visit for  Prolia . Given subcutaneous in left upper arm

## 2023-08-01 ENCOUNTER — Other Ambulatory Visit: Payer: Self-pay | Admitting: Internal Medicine

## 2023-08-01 NOTE — Telephone Encounter (Signed)
 Pt is requesting refill for advair inhaler. Medication is not listed on lov note. Please advise Dr. Linder Revere

## 2023-08-02 ENCOUNTER — Telehealth: Payer: Self-pay | Admitting: Internal Medicine

## 2023-08-02 MED ORDER — FLUTICASONE-SALMETEROL 100-50 MCG/ACT IN AEPB: 1.0000 | INHALATION_SPRAY | Freq: Two times a day (BID) | RESPIRATORY_TRACT | 1 refills | Status: AC

## 2023-08-02 NOTE — Telephone Encounter (Signed)
 I called and spoke with pt. Pt states she needs a refill on her Advair. She has 2 puffs left, and has not used any today. Pt states she uses [facility]. It looks like this was reordered yesterday and given 8 refills. Pt states she contacted her pharmacy, and the pharmacy did not receive anything. I informed pt that I would contact [facility] and see what they are needing. Pt verbalized understanding.   I called [facility]. I spoke to Summer, a teacher, early years/pre. Summer states she needs a new order sent by Dr Neysa for the pt's Advair. She stated nothing was received by us  for this. I will send in an order for the pt to last her until her next appointment. Pt was last seen by Dr Neysa on 09-08-22, and is scheduled to see him again on 09-10-23.    Refills sent. NFN. Pt was informed and verbalized understanding.

## 2023-08-02 NOTE — Telephone Encounter (Signed)
 Advair refilled.

## 2023-08-02 NOTE — Telephone Encounter (Signed)
 Patient states needs refill for Advair inhaler. Pharmacy is [facility]. Patient out of medication. Patient phone number is [phone].

## 2023-08-21 ENCOUNTER — Ambulatory Visit (INDEPENDENT_AMBULATORY_CARE_PROVIDER_SITE_OTHER): Payer: Medicare PPO

## 2023-08-21 VITALS — Ht 60.0 in | Wt 149.0 lb

## 2023-08-21 DIAGNOSIS — Z0000 Encounter for general adult medical examination without abnormal findings: Secondary | ICD-10-CM | POA: Diagnosis not present

## 2023-08-21 NOTE — Patient Instructions (Signed)
 Ms. Bennett , Thank you for taking time to come for your Medicare Wellness Visit. I appreciate your ongoing commitment to your health goals. Please review the following plan we discussed and let me know if I can assist you in the future.   Referrals/Orders/Follow-Ups/Clinician Recommendations: Aim for 30 minutes of exercise or brisk walking, 6-8 glasses of water, and 5 servings of fruits and vegetables each day.  This is a list of the screening recommended for you and due dates:  Health Maintenance  Topic Date Due   Zoster (Shingles) Vaccine (2 of 2) 07/04/2023   COVID-19 Vaccine (3 - 2024-25 season) 04/05/2024*   Medicare Annual Wellness Visit  08/20/2024   DEXA scan (bone density measurement)  09/20/2024   DTaP/Tdap/Td vaccine (2 - Td or Tdap) 10/11/2031   Pneumonia Vaccine  Completed   Flu Shot  Completed   HPV Vaccine  Aged Out  *Topic was postponed. The date shown is not the original due date.    Advanced directives: (ACP Link)Information on Advanced Care Planning can be found at [facility] Advance Health Care Directives Advance Health Care Directives ([email or web address]   Next Medicare Annual Wellness Visit scheduled for next year: Yes

## 2023-08-21 NOTE — Progress Notes (Signed)
 Subjective:   Deborah Branch is a 89 y.o. who presents for a Medicare Wellness preventive visit.  Visit Complete: Virtual I connected with  Deborah Branch on 08/21/23 by a audio enabled telemedicine application and verified that I am speaking with the correct person using two identifiers.  Patient Location: Home  Provider Location: Home Office  I discussed the limitations of evaluation and management by telemedicine. The patient expressed understanding and agreed to proceed.  Vital Signs: Because this visit was a virtual/telehealth visit, some criteria may be missing or patient reported. Any vitals not documented were not able to be obtained and vitals that have been documented are patient reported.  VideoDeclined- This patient declined Librarian, academic. Therefore the visit was completed with audio only.  AWV Questionnaire: No: Patient Medicare AWV questionnaire was not completed prior to this visit.  Cardiac Risk Factors include: advanced age (>62men, >19 women);hypertension     Objective:    Today's Vitals   08/21/23 1448  Weight: 149 lb (67.6 kg)  Height: 5' (1.524 m)   Body mass index is 29.1 kg/m.     08/21/2023    2:50 PM 05/31/2023   10:58 AM 11/26/2022    2:47 PM 08/17/2022   10:36 AM 12/08/2021    3:21 AM 02/01/2018   11:25 AM 08/26/2015    2:30 PM  Advanced Directives  Does Patient Have a Medical Advance Directive? No Yes No Yes No Yes No  Type of Special educational needs teacher of [place];Living will  Healthcare Power of [place];Living will  Living will   Does patient want to make changes to medical advance directive?  No - Patient declined    No - Patient declined   Copy of Healthcare Power of Attorney in Chart?  No - copy requested  No - copy requested     Would patient like information on creating a medical advance directive? Yes (MAU/Ambulatory/Procedural Areas - Information given)  No - Patient declined  No - Patient declined No -  Patient declined     Current Medications (verified) Outpatient Encounter Medications as of 08/21/2023  Medication Sig   amLODipine (NORVASC) 5 MG tablet TAKE 1 TABLET BY MOUTH ONCE DAILY   calcium carbonate (OSCAL) 1500 (600 Ca) MG TABS tablet Take 1 tablet by mouth 2 (two) times daily with a meal.   denosumab (PROLIA) 60 MG/ML SOSY injection Inject 60 mg into the skin every 6 (six) months.   fluticasone-salmeterol (ADVAIR DISKUS) 100-50 MCG/ACT AEPB Inhale 1 puff into the lungs 2 (two) times daily.   fluticasone-salmeterol (ADVAIR) 100-50 MCG/ACT AEPB INHALE 1 PUFF into THE lungs TWICE DAILY   ipratropium-albuterol (DUONEB) 0.5-2.5 (3) MG/3ML SOLN Take 3 mLs by nebulization every 6 (six) hours as needed (shortness of breath and cough).   lisinopril (ZESTRIL) 40 MG tablet Take 1 tablet (40 mg total) by mouth daily.   loratadine (CLARITIN REDITABS) 10 MG dissolvable tablet Take 10 mg by mouth daily.   Menaquinone-7 (VITAMIN K2) 100 MCG CAPS Take 1 tablet by mouth daily.   Omalizumab (XOLAIR [place]) Inject 225 mg into the skin every 3 (three) months.   omeprazole (PRILOSEC) 20 MG capsule Take 1 capsule (20 mg total) by mouth daily.   simvastatin (ZOCOR) 20 MG tablet Take 1 tablet (20 mg total) by mouth at bedtime.   traZODone (DESYREL) 50 MG tablet Take 25-50 mg by mouth at bedtime as needed.   cetirizine (ZYRTEC) 10 MG tablet Take 10 mg by  mouth daily. (Patient not taking: Reported on 08/21/2023)   furosemide (LASIX) 20 MG tablet TAKE 1 TABLET BY MOUTH DAILY (Patient not taking: Reported on 08/21/2023)   No facility-administered encounter medications on file as of 08/21/2023.    Allergies (verified) Clarithromycin, Cleocin [clindamycin], and Penicillins   History: Past Medical History:  Diagnosis Date   Allergic asthma    Asthma    Cataract    Congestive heart failure (CHF) (HCC)    Eczema    GERD (gastroesophageal reflux disease)    Hyperlipidemia    Hypertension    Osteoporosis     Past Surgical History:  Procedure Laterality Date   CATARACT EXTRACTION, BILATERAL  2012   Family History  Problem Relation Age of Onset   Heart attack Paternal Grandfather    Colon cancer Neg Hx    Esophageal cancer Neg Hx    Rectal cancer Neg Hx    Stomach cancer Neg Hx    Social History   Socioeconomic History   Marital status: Widowed    Spouse name: Not on file   Number of children: 3   Years of education: Not on file   Highest education level: Not on file  Occupational History   Occupation: Retired  Tobacco Use   Smoking status: Former    Types: Cigarettes   Smokeless tobacco: Never   Tobacco comments:    States she did it socially  Advertising account planner   Vaping status: Never Used  Substance and Sexual Activity   Alcohol use: Not Currently    Comment: margarita every month   Drug use: No   Sexual activity: Not on file  Other Topics Concern   Not on file  Social History Narrative   Not on file   Social Drivers of Health   Financial Resource Strain: Low Risk  (08/21/2023)   Overall Financial Resource Strain (CARDIA)    Difficulty of Paying Living Expenses: Not hard at all  Food Insecurity: No Food Insecurity (08/21/2023)   Hunger Vital Sign    Worried About Running Out of Food in the Last Year: Never true    Ran Out of Food in the Last Year: Never true  Transportation Needs: No Transportation Needs (08/21/2023)   PRAPARE - Administrator, Civil Service (Medical): No    Lack of Transportation (Non-Medical): No  Physical Activity: Inactive (08/21/2023)   Exercise Vital Sign    Days of Exercise per Week: 0 days    Minutes of Exercise per Session: 0 min  Stress: No Stress Concern Present (08/21/2023)   [employer] of Occupational Health - Occupational Stress Questionnaire    Feeling of Stress : Not at all  Social Connections: Moderately Isolated (08/21/2023)   Social Connection and Isolation Panel [NHANES]    Frequency of Communication with Friends  and Family: More than three times a week    Frequency of Social Gatherings with Friends and Family: Three times a week    Attends Religious Services: More than 4 times per year    Active Member of Clubs or Organizations: No    Attends Banker Meetings: Never    Marital Status: Widowed    Tobacco Counseling Counseling given: Not Answered Tobacco comments: States she did it socially    Clinical Intake:  Pre-visit preparation completed: Yes  Pain : No/denies pain     Diabetes: No  How often do you need to have someone help you when you read instructions, pamphlets, or other written materials  from your doctor or pharmacy?: 1 - Never  Interpreter Needed?: No  Information entered by :: Kandis Fantasia LPN   Activities of Daily Living     08/21/2023    2:50 PM 11/26/2022    6:00 PM  In your present state of health, do you have any difficulty performing the following activities:  Hearing? 0 0  Vision? 0 1  Difficulty concentrating or making decisions? 0 0  Walking or climbing stairs? 0 0  Dressing or bathing? 0 0  Doing errands, shopping? 0 0  Preparing Food and eating ? N   Using the Toilet? N   In the past six months, have you accidently leaked urine? N   Do you have problems with loss of bowel control? N   Managing your Medications? N   Managing your Finances? N   Housekeeping or managing your Housekeeping? N     Patient Care Team: Gabriel Earing, FNP as PCP - General (Family Medicine)  Indicate any recent Medical Services you may have received from other than [facility] providers in the past year (date may be approximate).     Assessment:   This is a routine wellness examination for [place].  Hearing/Vision screen Hearing Screening - Comments:: Denies hearing difficulties   Vision Screening - Comments:: Wears rx glasses - up to date with routine eye exams with Dr. Charlotte Sanes     Goals Addressed             This Visit's Progress    Remain active and  independent         Depression Screen     08/21/2023    2:49 PM 05/31/2023   11:02 AM 05/09/2023   11:20 AM 03/21/2023   10:58 AM 02/12/2023    2:13 PM 02/08/2023   11:12 AM 12/27/2022   10:29 AM  PHQ 2/9 Scores  PHQ - 2 Score 0 0 0 0 0 0 0  PHQ- 9 Score   3 3 2 2      Fall Risk     08/21/2023    2:50 PM 05/31/2023   11:01 AM 05/09/2023   11:20 AM 03/21/2023   10:57 AM 02/12/2023    2:14 PM  Fall Risk   Falls in the past year? 0 0 0 0 0  Number falls in past yr: 0 0   0  Injury with Fall? 0 0   0  Risk for fall due to : No Fall Risks No Fall Risks   No Fall Risks  Follow up Falls prevention discussed;Education provided;Falls evaluation completed Falls evaluation completed   Falls evaluation completed    MEDICARE RISK AT HOME:  Medicare Risk at Home Any stairs in or around the home?: No If so, are there any without handrails?: No Home free of loose throw rugs in walkways, pet beds, electrical cords, etc?: Yes Adequate lighting in your home to reduce risk of falls?: Yes Life alert?: No Use of a cane, walker or w/c?: No Grab bars in the bathroom?: Yes Shower chair or bench in shower?: No Elevated toilet seat or a handicapped toilet?: Yes  TIMED UP AND GO:  Was the test performed?  No  Cognitive Function: 6CIT completed        08/21/2023    2:50 PM 08/17/2022   10:36 AM  6CIT Screen  What Year? 0 points 0 points  What month? 0 points 0 points  What time? 0 points 0 points  Count back from 20 0 points  0 points  Months in reverse 0 points 0 points  Repeat phrase 0 points 0 points  Total Score 0 points 0 points    Immunizations Immunization History  Administered Date(s) Administered   Fluad Quad(high Dose 65+) 03/19/2019, 03/18/2020, 03/10/2021, 04/03/2022   Fluad Trivalent(High Dose 65+) 03/21/2023   Influenza Split 03/13/2011, 03/12/2012   Influenza Whole 03/13/2008, 03/14/2010   Influenza, High Dose Seasonal PF 03/19/2017, 03/21/2018   Influenza,inj,Quad PF,6+  Mos 03/12/2013, 03/12/2014, 03/15/2015, 03/17/2016   PFIZER(Purple Top)SARS-COV-2 Vaccination 08/08/2019, 09/02/2019   PNEUMOCOCCAL CONJUGATE-20 03/21/2023   Pneumococcal-Unspecified 12/24/2013   Rsv, Bivalent, Protein Subunit Rsvpref,pf Verdis Frederickson) 04/11/2022   Tdap 10/10/2021   Zoster Recombinant(Shingrix) 05/09/2023    Screening Tests Health Maintenance  Topic Date Due   Zoster Vaccines- Shingrix (2 of 2) 07/04/2023   COVID-19 Vaccine (3 - 2024-25 season) 04/05/2024 (Originally 02/25/2023)   Medicare Annual Wellness (AWV)  08/20/2024   DEXA SCAN  09/20/2024   DTaP/Tdap/Td (2 - Td or Tdap) 10/11/2031   Pneumonia Vaccine 13+ Years old  Completed   INFLUENZA VACCINE  Completed   HPV VACCINES  Aged Out    Health Maintenance  Health Maintenance Due  Topic Date Due   Zoster Vaccines- Shingrix (2 of 2) 07/04/2023   Health Maintenance Items Addressed: Shingrix at next office visit  Additional Screening:  Vision Screening: Recommended annual ophthalmology exams for early detection of glaucoma and other disorders of the eye.  Dental Screening: Recommended annual dental exams for proper oral hygiene  Community Resource Referral / Chronic Care Management: CRR required this visit?  No   CCM required this visit?  No     Plan:     I have personally reviewed and noted the following in the patient's chart:   Medical and social history Use of alcohol, tobacco or illicit drugs  Current medications and supplements including opioid prescriptions. Patient is not currently taking opioid prescriptions. Functional ability and status Nutritional status Physical activity Advanced directives List of other physicians Hospitalizations, surgeries, and ER visits in previous 12 months Vitals Screenings to include cognitive, depression, and falls Referrals and appointments  In addition, I have reviewed and discussed with patient certain preventive protocols, quality metrics, and best  practice recommendations. A written personalized care plan for preventive services as well as general preventive health recommendations were provided to patient.     Kandis Fantasia [place], [place]   1/[id number]/[id number]   After Visit Summary: (MyChart) Due to this being a telephonic visit, the after visit summary with patients personalized plan was offered to patient via MyChart   Notes: Nothing significant to report at this time.

## 2023-08-24 ENCOUNTER — Other Ambulatory Visit: Payer: Self-pay | Admitting: Family Medicine

## 2023-08-27 ENCOUNTER — Telehealth: Payer: Self-pay

## 2023-08-27 NOTE — Telephone Encounter (Signed)
 Auth Submission: APPROVED Site of care: Site of care: [facility] Payer: humana Medication & CPT/J Code(s) submitted: Xolair (Omalizumab) [id number] Route of submission (phone, fax, portal): portal Phone # Fax # Auth type: Buy/Bill PB Units/visits requested: 225mg , q12weeks Reference number: [id number] Approval from: 08/24/23 to 06/25/24

## 2023-08-28 DIAGNOSIS — H353131 Nonexudative age-related macular degeneration, bilateral, early dry stage: Secondary | ICD-10-CM | POA: Diagnosis not present

## 2023-08-28 DIAGNOSIS — Z961 Presence of intraocular lens: Secondary | ICD-10-CM | POA: Diagnosis not present

## 2023-08-28 DIAGNOSIS — H52203 Unspecified astigmatism, bilateral: Secondary | ICD-10-CM | POA: Diagnosis not present

## 2023-08-28 DIAGNOSIS — H40013 Open angle with borderline findings, low risk, bilateral: Secondary | ICD-10-CM | POA: Diagnosis not present

## 2023-08-29 ENCOUNTER — Encounter: Payer: Medicare PPO | Attending: Internal Medicine | Admitting: *Deleted

## 2023-08-29 VITALS — BP 154/73 | HR 69 | Temp 98.0°F | Resp 18

## 2023-08-29 DIAGNOSIS — J4540 Moderate persistent asthma, uncomplicated: Secondary | ICD-10-CM | POA: Insufficient documentation

## 2023-08-29 MED ORDER — OMALIZUMAB 75 MG/0.5ML ~~LOC~~ SOSY
225.0000 mg | PREFILLED_SYRINGE | Freq: Once | SUBCUTANEOUS | Status: AC
  Administered 2023-08-29: 225 mg via SUBCUTANEOUS

## 2023-08-29 NOTE — Progress Notes (Signed)
 Diagnosis: Asthma  Provider:   Jetty Duhamel, MD  Procedure: Injection  Xolair (Omalizumab), Dose: 150 mg, Site: subcutaneous, Number of injections: 2  Injection Site(s): Left arm and Right arm 150 mg right arm 75 mg left arm  Post Care: Observation period completed  Discharge: Condition: Good, Destination: Home . AVS Provided  Performed by:  Daleen Squibb, RN

## 2023-09-08 NOTE — Progress Notes (Unsigned)
 Patient ID: Deborah Branch, female    DOB: [birth year], 89 y.o.   MRN: [id number]  HPI  female never smoker followed for chronic asthma (Xolair), allergic rhinosinusitis, complicated by GERD Office Spirometry 08/26/15-moderate obstruction-FVC 1.7/76%, FEV1 1.1/67%, ratio 0.66, FEF 25-75% 0.6/41% -------------------------------------------------------------------------------------------- .  09/08/22- 89 year old female never smoker followed for Chronic Asthma (Xolair), allergic rhinosinusitis, complicated by GERD, Osteoporosis,  -Advair 100,  Proair hfa/ Ventolin, neb Duoneb,  Xolair(q 3 months) Covid vax- 3 Phizer Flu vax- had -----Pt states she has been clearing her throat over the last couple of months she advises the phlegm is clear-yellowish. Denies any coughing, sob or wheezing Throat clearing is variable but she is not aware of active reflux or heartburn and feels stable.  No significant changes in overall health or acute events.  Spring tree pollen season has started and she is trying to stay indoors to avoid this.  She continues to think Xolair is helpful, now getting injection every 3 months. Agrees to update chest x-ray. CXR 12/07/21- IMPRESSION: 1. Chronic appearing increased interstitial lung markings. 2. Mild left basilar atelectasis and/or infiltrate.  09/10/23-  89 year old female never smoker followed for Chronic Asthma (Xolair), allergic rhinosinusitis, complicated by GERD, Osteoporosis,  -Advair 100,  Proair hfa/ Ventolin, neb Duoneb,  Xolair(q 3 months)  CXR 09/08/22 IMPRESSION: Bronchial wall thickening may reflect bronchitis/reactive airway disease, with no evidence of lobar pneumonia   Review of Systems- see HPI  +  = positive Constitutional:   No-   weight loss, night sweats, fevers, chills, fatigue, lassitude. HEENT:   No-  headaches, difficulty swallowing, tooth/dental problems, +sore throat,       No-  sneezing, itching, ear ache, +nasal congestion, + post nasal  drip,  CV:  No-   chest pain, orthopnea, PND, +swelling in lower extremities, anasarca, dizziness, palpitations Resp: no-shortness of breath with exertion or at rest.              +  productive cough,   +non-productive cough,  No-  coughing up of blood.             +change in color of mucus.     Skin: No-   rash or lesions. GI:  No-   heartburn, indigestion, abdominal pain, nausea, vomiting,  GU: MS:  No-   joint pain or swelling. Neuro- grossly normal  Psych:  No- change in mood or affect. No depression or anxiety.  No memory loss.     Objective:   Physical Exam General- Alert, Oriented, Affect-appropriate, Distress- none acute  Medium build Skin- rash-none, lesions- none, excoriation- none Lymphadenopathy- none Head- atraumatic            Eyes- Gross vision intact, PERRLA, conjunctivae clear secretions            Ears- Hearing, canals normal            Nose- No sniffing, no-Septal dev, mucus, polyps, erosion, perforation             Throat- Mallampati III-IV , mucosa clear- not red , drainage- none, tonsils- atrophic Neck- flexible , trachea midline, no stridor , thyroid nl, carotid no bruit Chest - symmetrical excursion , unlabored           Heart/CV- RRR , + murmur 1/6 syst , no gallop  , no rub, nl s1 s2                           -  JVD- none , edema+trace, stasis changes- none, varices- none           Lung-  Wheeze- none , dullness-none, rub- none, cough-none,            Chest wall-  Abd-  Br/ Gen/ Rectal- Not done, not indicated Extrem- cyanosis- none, clubbing, none, atrophy- none, strength- nl Neuro- grossly intact to observation

## 2023-09-10 ENCOUNTER — Ambulatory Visit: Payer: Medicare PPO | Admitting: Internal Medicine

## 2023-09-10 ENCOUNTER — Encounter: Payer: Self-pay | Admitting: Internal Medicine

## 2023-09-10 VITALS — BP 154/74 | Temp 98.9°F | Resp 18 | Ht 60.0 in | Wt 151.0 lb

## 2023-09-10 DIAGNOSIS — J4540 Moderate persistent asthma, uncomplicated: Secondary | ICD-10-CM | POA: Diagnosis not present

## 2023-09-10 NOTE — Patient Instructions (Signed)
 We can continue current meds  You can ask at your drug store for an otc ear wax removal kit- like Cerumenex

## 2023-09-29 ENCOUNTER — Other Ambulatory Visit: Payer: Self-pay | Admitting: Family Medicine

## 2023-10-10 ENCOUNTER — Telehealth: Payer: Self-pay | Admitting: *Deleted

## 2023-10-10 NOTE — Telephone Encounter (Signed)
 Would favor trying over-the-counter Tylenol Cold and flu temporarily to help with active congestion.  If she still having trouble next week then we may need to add something in addition.  She needs to call us  and let us  know.

## 2023-10-10 NOTE — Telephone Encounter (Signed)
 Called and spoke with pt. Advised of RB note. Pt verbalized understanding and had no concerns. NFN

## 2023-10-10 NOTE — Telephone Encounter (Signed)
 Copied from CRM [id number]. Topic: General - Other >> Oct 09, 2023  1:43 PM Alverda Joe S wrote: Reason for EAV:[id number] is having serious head congestion, would like to be sent some medicine and speak with dr. Linder Revere if possible. Please call patient.  I called and spoke with the pt  She is c/o "head stopped up", trouble hearing in her right ear x 3 days  She denies any cough, wheezing, SOB, fevers, aches  She has had some clear nasal d/c  She is taking claritin, but has not tried anything else OTC  She has not called her PCP since Dr Linder Revere usually sees her for issues like this  Please advise, thanks!

## 2023-10-15 ENCOUNTER — Ambulatory Visit: Payer: Self-pay

## 2023-10-15 NOTE — Telephone Encounter (Signed)
 Copied from CRM [id number]. Topic: Clinical - Medication Question >> Oct 15, 2023  9:10 AM Hilton Lucky wrote: Reason for CRM: Patient states has been having ongoing problem of head cold symptoms progressing into cough. States coughing yellow and head is stopped up. Patient requesting an rx for allergy  relief.   Chief Complaint: Cough Symptoms: Cough, mild shortness of breath, runny nose/congestion  Frequency: Frequent  Pertinent Negatives: Patient denies fever, chest pain  Disposition: [] ED /[] Urgent Care (no appt availability in office) / [x] Appointment(In office/virtual)/ []  [facility]/ [] Home Care/ [x] Refused Recommended Disposition /[] [facility]/ []  Follow-up with PCP Additional Notes: Patient reports she has had a head cold that began last week. She states that she has been experiencing a cough, head congestion, runny nose, and that this morning she woke up with mild difficulty breathing. She denies any chest pain. Patient states she has used OTC cold and allergy  medication without relief and would like something prescribed if possible. Patient declined an appointment at this time.     E2C2 Pulmonary Triage - Initial Assessment Questions "Chief Complaint (e.g., cough, sob, wheezing, fever, chills, sweat or additional symptoms) *Go to specific symptom protocol after initial questions. Cough  "How long have symptoms been present?" Last week  Have you tested for COVID or Flu? Note: If not, ask patient if a home test can be taken. If so, instruct patient to call back for positive results. No  MEDICINES:   "Have you used any OTC meds to help with symptoms?" Yes If yes, ask "What medications?" OTC allergy  and cold medication   "Have you used your inhalers/maintenance medication?" No If yes, "What medications?" N/A  If inhaler, ask "How many puffs and how often?" Note: Review instructions on medication in the chart. N/A  OXYGEN: "Do you wear supplemental oxygen?"  No If yes, "How many liters are you supposed to use?" N/A  "Do you monitor your oxygen levels?" No If yes, "What is your reading (oxygen level) today?" N/A  "What is your usual oxygen saturation reading?"  (Note: Pulmonary O2 sats should be 90% or greater) N/A   Reason for Disposition  [1] Known COPD or other severe lung disease (i.e., bronchiectasis, cystic fibrosis, lung surgery) AND [2] worsening symptoms (i.e., increased sputum purulence or amount, increased breathing difficulty  Answer Assessment - Initial Assessment Questions 1. ONSET: "When did the cough begin?"      Last week 2. SEVERITY: "How bad is the cough today?"      Mild to moderate  3. SPUTUM: "Describe the color of your sputum" (none, dry cough; clear, white, yellow, green)     Yellow 4. HEMOPTYSIS: "Are you coughing up any blood?" If so ask: "How much?" (flecks, streaks, tablespoons, etc.)     No 5. DIFFICULTY BREATHING: "Are you having difficulty breathing?" If Yes, ask: "How bad is it?" (e.g., mild, moderate, severe)    - MILD: No SOB at rest, mild SOB with walking, speaks normally in sentences, can lie down, no retractions, pulse < 100.    - MODERATE: SOB at rest, SOB with minimal exertion and prefers to sit, cannot lie down flat, speaks in phrases, mild retractions, audible wheezing, pulse 100-120.    - SEVERE: Very SOB at rest, speaks in single words, struggling to breathe, sitting hunched forward, retractions, pulse > 120      Mild 6. FEVER: "Do you have a fever?" If Yes, ask: "What is your temperature, how was it measured, and when did it start?"  No 7. CARDIAC HISTORY: "Do you have any history of heart disease?" (e.g., heart attack, congestive heart failure)      Yes 8. LUNG HISTORY: "Do you have any history of lung disease?"  (e.g., pulmonary embolus, asthma, emphysema)     Yes 9. PE RISK FACTORS: "Do you have a history of blood clots?" (or: recent major surgery, recent prolonged travel, bedridden)      No 10. OTHER SYMPTOMS: "Do you have any other symptoms?" (e.g., runny nose, wheezing, chest pain)       Runny nose  Protocols used: Cough - Acute Productive-A-AH

## 2023-10-15 NOTE — Telephone Encounter (Signed)
FYI Dr Young-

## 2023-10-17 ENCOUNTER — Encounter: Payer: Self-pay | Admitting: Family Medicine

## 2023-10-17 ENCOUNTER — Ambulatory Visit: Admitting: Family Medicine

## 2023-10-17 VITALS — BP 139/63 | HR 77 | Temp 98.3°F | Ht 60.0 in | Wt 149.0 lb

## 2023-10-17 DIAGNOSIS — H6123 Impacted cerumen, bilateral: Secondary | ICD-10-CM

## 2023-10-17 DIAGNOSIS — J4541 Moderate persistent asthma with (acute) exacerbation: Secondary | ICD-10-CM

## 2023-10-17 MED ORDER — ALBUTEROL SULFATE HFA 108 (90 BASE) MCG/ACT IN AERS: 2.0000 | INHALATION_SPRAY | Freq: Four times a day (QID) | RESPIRATORY_TRACT | 0 refills | Status: AC | PRN

## 2023-10-17 MED ORDER — PREDNISONE 20 MG PO TABS: 40.0000 mg | ORAL_TABLET | Freq: Every day | ORAL | 0 refills | Status: AC

## 2023-10-17 MED ORDER — IPRATROPIUM-ALBUTEROL 0.5-2.5 (3) MG/3ML IN SOLN: 3.0000 mL | Freq: Four times a day (QID) | RESPIRATORY_TRACT | 2 refills | Status: AC | PRN

## 2023-10-17 NOTE — Patient Instructions (Addendum)
 Debrox drops both ears 5-10 drops twice daily  Follow up in 2 weeks for repeat ear lavage

## 2023-10-17 NOTE — Progress Notes (Signed)
 Subjective:  Patient ID: Deborah Branch, female    DOB: Jan 23, 1935, 89 y.o.   MRN: [id number]  Patient Care Team: Albertha Huger, FNP as PCP - General (Family Medicine)   Chief Complaint:  cough, congestion (Cough, congestion, sob, difficulty sleeping/X 3 days)   HPI: Deborah Branch is a 89 y.o. female presenting on 10/17/2023 for cough, congestion (Cough, congestion, sob, difficulty sleeping/X 3 days)  HPI Symptoms started a couple of weeks ago with head congestion, ear fullness in right ear. States that the past few days she is coughing more, has thick, yellow sputum production. Reports lack of sleep. Endorses rhinorrhea and slight sore throat. Has history of asthma. Using Advair BID. Using nebulizer at home. She is established with pulmonology. States that she is supposed to have a rescue inhaler but she currently does not have one.   Relevant past medical, surgical, family, and social history reviewed and updated as indicated.  Allergies and medications reviewed and updated. Data reviewed: Chart in Epic.   Past Medical History:  Diagnosis Date   Allergic asthma    Asthma    Cataract    Congestive heart failure (CHF) (HCC)    Eczema    GERD (gastroesophageal reflux disease)    Hyperlipidemia    Hypertension    Osteoporosis     Past Surgical History:  Procedure Laterality Date   CATARACT EXTRACTION, BILATERAL  2012    Social History   Socioeconomic History   Marital status: Widowed    Spouse name: Not on file   Number of children: 3   Years of education: Not on file   Highest education level: Not on file  Occupational History   Occupation: Retired  Tobacco Use   Smoking status: Former    Types: Cigarettes   Smokeless tobacco: Never   Tobacco comments:    States she did it socially  Advertising account planner   Vaping status: Never Used  Substance and Sexual Activity   Alcohol use: Not Currently    Comment: margarita every month   Drug use: No   Sexual activity: Not on  file  Other Topics Concern   Not on file  Social History Narrative   Not on file   Social Drivers of Health   Financial Resource Strain: Low Risk  (08/21/2023)   Overall Financial Resource Strain (CARDIA)    Difficulty of Paying Living Expenses: Not hard at all  Food Insecurity: No Food Insecurity (08/21/2023)   Hunger Vital Sign    Worried About Running Out of Food in the Last Year: Never true    Ran Out of Food in the Last Year: Never true  Transportation Needs: No Transportation Needs (08/21/2023)   PRAPARE - Administrator, Civil Service (Medical): No    Lack of Transportation (Non-Medical): No  Physical Activity: Inactive (08/21/2023)   Exercise Vital Sign    Days of Exercise per Week: 0 days    Minutes of Exercise per Session: 0 min  Stress: No Stress Concern Present (08/21/2023)   [employer] of Occupational Health - Occupational Stress Questionnaire    Feeling of Stress : Not at all  Social Connections: Moderately Isolated (08/21/2023)   Social Connection and Isolation Panel [NHANES]    Frequency of Communication with Friends and Family: More than three times a week    Frequency of Social Gatherings with Friends and Family: Three times a week    Attends Religious Services: More than 4 times per  year    Active Member of Clubs or Organizations: No    Attends Banker Meetings: Never    Marital Status: Widowed  Intimate Partner Violence: Not At Risk (08/21/2023)   Humiliation, Afraid, Rape, and Kick questionnaire    Fear of Current or Ex-Partner: No    Emotionally Abused: No    Physically Abused: No    Sexually Abused: No    Outpatient Encounter Medications as of 10/17/2023  Medication Sig   amLODipine  (NORVASC ) 5 MG tablet TAKE 1 TABLET BY MOUTH ONCE DAILY   calcium  carbonate (OSCAL) 1500 (600 Ca) MG TABS tablet Take 1 tablet by mouth 2 (two) times daily with a meal.   cetirizine (ZYRTEC) 10 MG tablet Take 10 mg by mouth daily.   denosumab   (PROLIA ) 60 MG/ML SOSY injection Inject 60 mg into the skin every 6 (six) months.   fluticasone -salmeterol (ADVAIR DISKUS) 100-50 MCG/ACT AEPB Inhale 1 puff into the lungs 2 (two) times daily.   fluticasone -salmeterol (ADVAIR) 100-50 MCG/ACT AEPB INHALE 1 PUFF into THE lungs TWICE DAILY   furosemide  (LASIX ) 20 MG tablet TAKE 1 TABLET BY MOUTH DAILY   ipratropium-albuterol  (DUONEB) 0.5-2.5 (3) MG/3ML SOLN Take 3 mLs by nebulization every 6 (six) hours as needed (shortness of breath and cough).   lisinopril  (ZESTRIL ) 40 MG tablet Take 1 tablet (40 mg total) by mouth daily.   loratadine (CLARITIN REDITABS) 10 MG dissolvable tablet Take 10 mg by mouth daily.   Menaquinone-7 (VITAMIN K2) 100 MCG CAPS Take 1 tablet by mouth daily.   Omalizumab  (XOLAIR  Hurt) Inject 225 mg into the skin every 3 (three) months.   omeprazole  (PRILOSEC) 20 MG capsule Take 1 capsule (20 mg total) by mouth daily.   simvastatin  (ZOCOR ) 20 MG tablet Take 1 tablet (20 mg total) by mouth at bedtime.   traZODone  (DESYREL ) 50 MG tablet TAKE ONE-HALF TO 1 TABLET BY MOUTH AT BEDTIME AS NEEDED FOR SLEEP   No facility-administered encounter medications on file as of 10/17/2023.    Allergies  Allergen Reactions   Clarithromycin     REACTION: itching   Cleocin [Clindamycin] Other (See Comments)    C-diff   Penicillins     REACTION: hives    Review of Systems As per HPI  Objective:  BP 139/63   Pulse 77   Temp 98.3 F (36.8 C)   Ht 5' (1.524 m)   Wt 149 lb (67.6 kg)   SpO2 95%   BMI 29.10 kg/m    Wt Readings from Last 3 Encounters:  09/10/23 151 lb (68.5 kg)  08/21/23 149 lb (67.6 kg)  06/04/23 149 lb 3.2 oz (67.7 kg)  Physical Exam Constitutional:      General: She is awake.     Appearance: Normal appearance. She is well-developed and well-groomed. She is not ill-appearing, toxic-appearing or diaphoretic.  HENT:     Right Ear: There is impacted cerumen.     Left Ear: There is impacted cerumen.     Nose:  Congestion and rhinorrhea present.     Right Sinus: No maxillary sinus tenderness or frontal sinus tenderness.     Left Sinus: No maxillary sinus tenderness or frontal sinus tenderness.     Mouth/Throat:     Lips: Pink. No lesions.     Mouth: Mucous membranes are moist.     Dentition: Normal dentition.     Tongue: No lesions.     Palate: No mass.     Pharynx: Posterior oropharyngeal erythema present.  No pharyngeal swelling, oropharyngeal exudate, uvula swelling or postnasal drip.     Tonsils: No tonsillar exudate or tonsillar abscesses.  Cardiovascular:     Rate and Rhythm: Normal rate and regular rhythm.     Heart sounds: Murmur heard.  Pulmonary:     Effort: Pulmonary effort is normal.     Breath sounds: No stridor, decreased air movement or transmitted upper airway sounds. Decreased breath sounds present. No wheezing, rhonchi or rales.  Musculoskeletal:     Right lower leg: No edema.     Left lower leg: No edema.  Lymphadenopathy:     Head:     Right side of head: No submental, submandibular, tonsillar, preauricular or posterior auricular adenopathy.     Left side of head: No submental, submandibular, tonsillar, preauricular or posterior auricular adenopathy.  Skin:    General: Skin is warm.     Capillary Refill: Capillary refill takes less than 2 seconds.  Neurological:     General: No focal deficit present.     Mental Status: She is alert, oriented to person, place, and time and easily aroused. Mental status is at baseline.  Psychiatric:        Attention and Perception: Attention and perception normal.        Mood and Affect: Mood and affect normal.        Speech: Speech normal.        Behavior: Behavior normal. Behavior is cooperative.        Thought Content: Thought content normal.        Cognition and Memory: Cognition and memory normal.     Results for orders placed or performed in visit on 03/21/23  CBC with Differential/Platelet   Collection Time: 03/21/23 11:30  AM  Result Value Ref Range   WBC 10.5 3.4 - 10.8 x10E3/uL   RBC 4.82 3.77 - 5.28 x10E6/uL   Hemoglobin 14.0 11.1 - 15.9 g/dL   Hematocrit 84.6 96.2 - 46.6 %   MCV 90 79 - 97 fL   MCH 29.0 26.6 - 33.0 pg   MCHC 32.3 31.5 - 35.7 g/dL   RDW 95.2 84.1 - 32.4 %   Platelets 271 150 - 450 x10E3/uL   Neutrophils 56 Not Estab. %   Lymphs 32 Not Estab. %   Monocytes 7 Not Estab. %   Eos 4 Not Estab. %   Basos 1 Not Estab. %   Neutrophils Absolute 5.9 1.4 - 7.0 x10E3/uL   Lymphocytes Absolute 3.3 (H) 0.7 - 3.1 x10E3/uL   Monocytes Absolute 0.8 0.1 - 0.9 x10E3/uL   EOS (ABSOLUTE) 0.4 0.0 - 0.4 x10E3/uL   Basophils Absolute 0.1 0.0 - 0.2 x10E3/uL   Immature Granulocytes 0 Not Estab. %   Immature Grans (Abs) 0.0 0.0 - 0.1 x10E3/uL  CMP14+EGFR   Collection Time: 03/21/23 11:30 AM  Result Value Ref Range   Glucose 104 (H) 70 - 99 mg/dL   BUN 19 8 - 27 mg/dL   Creatinine, Ser 4.01 0.57 - 1.00 mg/dL   eGFR 61 >02 VO/ZDG/6.44   BUN/Creatinine Ratio 21 12 - 28   Sodium 143 134 - 144 mmol/L   Potassium 4.6 3.5 - 5.2 mmol/L   Chloride 105 96 - 106 mmol/L   CO2 23 20 - 29 mmol/L   Calcium  9.7 8.7 - 10.3 mg/dL   Total Protein 6.8 6.0 - 8.5 g/dL   Albumin 4.3 3.7 - 4.7 g/dL   Globulin, Total 2.5 1.5 - 4.5 g/dL   Bilirubin  Total 0.5 0.0 - 1.2 mg/dL   Alkaline Phosphatase 55 44 - 121 IU/L   AST 17 0 - 40 IU/L   ALT 12 0 - 32 IU/L  Lipid panel   Collection Time: 03/21/23 11:30 AM  Result Value Ref Range   Cholesterol, Total 192 100 - 199 mg/dL   Triglycerides 578 0 - 149 mg/dL   HDL 67 >46 mg/dL   VLDL Cholesterol Cal 24 5 - 40 mg/dL   LDL Chol Calc (NIH) 962 (H) 0 - 99 mg/dL   Chol/HDL Ratio 2.9 0.0 - 4.4 ratio  TSH   Collection Time: 03/21/23 11:30 AM  Result Value Ref Range   TSH 3.360 0.450 - 4.500 uIU/mL  VITAMIN D  25 Hydroxy (Vit-D Deficiency, Fractures)   Collection Time: 03/21/23 11:30 AM  Result Value Ref Range   Vit D, 25-Hydroxy 77.0 30.0 - 100.0 ng/mL  Hgb A1c w/o eAG    Collection Time: 03/21/23 11:30 AM  Result Value Ref Range   Hgb A1c MFr Bld 6.1 (H) 4.8 - 5.6 %  Specimen status report   Collection Time: 03/21/23 11:30 AM  Result Value Ref Range   specimen status report Comment        08/21/2023    2:49 PM 05/31/2023   11:02 AM 05/09/2023   11:20 AM 03/21/2023   10:58 AM 02/12/2023    2:13 PM  Depression screen PHQ 2/9  Decreased Interest 0 0 0 0 0  Down, Depressed, Hopeless 0 0 0 0 0  PHQ - 2 Score 0 0 0 0 0  Altered sleeping   3 2 2   Tired, decreased energy   0 1 0  Change in appetite   0 0 0  Feeling bad or failure about yourself    0 0 0  Trouble concentrating   0 0 0  Moving slowly or fidgety/restless   0 0 0  Suicidal thoughts   0 0 0  PHQ-9 Score   3 3 2   Difficult doing work/chores   Not difficult at all Not difficult at all Not difficult at all       05/09/2023   11:20 AM 03/21/2023   10:58 AM 02/12/2023    2:14 PM 02/08/2023   11:11 AM  GAD 7 : Generalized Anxiety Score  Nervous, Anxious, on Edge 0 0 0 0  Control/stop worrying 0 0 0 0  Worry too much - different things 0 0 0 0  Trouble relaxing 0 0 0 0  Restless 0 0 0 0  Easily annoyed or irritable 0 0 0 0  Afraid - awful might happen 0 0 0 0  Total GAD 7 Score 0 0 0 0  Anxiety Difficulty Not difficult at all Not difficult at all Not difficult at all Not difficult at all   Pertinent labs & imaging results that were available during my care of the patient were reviewed by me and considered in my medical decision making.  Assessment & Plan:  Lee-Anne was seen today for cough, congestion.  Diagnoses and all orders for this visit:  Moderate persistent asthma with exacerbation Refill provided on duoneb.  Will send in medication as below.  Reviewed notes from pulmonology, Linder Revere, MD, 09/10/23. Per note, patient "taking" ProAir  HFA/Ventolin , will refill as below. Recommend that patient follow up with pulmonology.  -     ipratropium-albuterol  (DUONEB) 0.5-2.5 (3) MG/3ML SOLN; Take  3 mLs by nebulization every 6 (six) hours as needed (shortness of breath and cough). -  albuterol  (VENTOLIN  HFA) 108 (90 Base) MCG/ACT inhaler; Inhale 2 puffs into the lungs every 6 (six) hours as needed for wheezing or shortness of breath. -     predniSONE  (DELTASONE ) 20 MG tablet; Take 2 tablets (40 mg total) by mouth daily with breakfast for 5 days.  Bilateral impacted cerumen Bilateral ear lavage attempted and unsuccessful. Instructed patient to use OTC debrox and return in 2 weeks for second attempt. Discouraged q-tips.   Continue all other maintenance medications.  Follow up plan: Return in about 2 weeks (around 10/31/2023) for ear check .   Continue healthy lifestyle choices, including diet (rich in fruits, vegetables, and lean proteins, and low in salt and simple carbohydrates) and exercise (at least 30 minutes of moderate physical activity daily).  Written and verbal instructions provided   The above assessment and management plan was discussed with the patient. The patient verbalized understanding of and has agreed to the management plan. Patient is aware to call the clinic if they develop any new symptoms or if symptoms persist or worsen. Patient is aware when to return to the clinic for a follow-up visit. Patient educated on when it is appropriate to go to the emergency department.   Jacqualyn Mates, DNP-FNP [facility] [address] [place], [place] [address] [phone]

## 2023-10-19 ENCOUNTER — Telehealth: Payer: Self-pay | Admitting: Internal Medicine

## 2023-10-19 MED ORDER — DOXYCYCLINE HYCLATE 100 MG PO CAPS: 100.0000 mg | ORAL_CAPSULE | Freq: Two times a day (BID) | ORAL | 0 refills | Status: DC

## 2023-10-19 MED ORDER — DOXYCYCLINE HYCLATE 100 MG PO TABS: 100.0000 mg | ORAL_TABLET | Freq: Two times a day (BID) | ORAL | 0 refills | Status: DC

## 2023-10-19 NOTE — Telephone Encounter (Signed)
 Script for doxycycline 

## 2023-10-19 NOTE — Telephone Encounter (Addendum)
 Called patient's son.  (On DPR).  Reviewed information per Dr. Linder Revere)  Patient's son verbalized understanding.  Sent in rx for doxycycline .. Was unable to send rx.  Dr. Linder Revere sent in rx for Doxycycline  as a phone in med.  EPIC defaulting to printing rx.  Rx sent successful.

## 2023-10-19 NOTE — Telephone Encounter (Signed)
 Please send doxycycline , 100 mg, # 14, 1 twice daily,   Suggest otc Mucinex ,  otc Delsym  cough syrup, stay hydrated.

## 2023-10-19 NOTE — Addendum Note (Signed)
 Addended byRefugio Cantor M on: 10/19/2023 02:59 PM   Modules accepted: Orders

## 2023-10-31 ENCOUNTER — Encounter: Payer: Self-pay | Admitting: Family Medicine

## 2023-10-31 ENCOUNTER — Ambulatory Visit: Admitting: Family Medicine

## 2023-10-31 VITALS — BP 124/50 | HR 70 | Temp 98.9°F | Wt 148.0 lb

## 2023-10-31 DIAGNOSIS — H6123 Impacted cerumen, bilateral: Secondary | ICD-10-CM | POA: Diagnosis not present

## 2023-10-31 DIAGNOSIS — R058 Other specified cough: Secondary | ICD-10-CM | POA: Diagnosis not present

## 2023-10-31 DIAGNOSIS — J4541 Moderate persistent asthma with (acute) exacerbation: Secondary | ICD-10-CM

## 2023-10-31 MED ORDER — PREDNISONE 20 MG PO TABS
40.0000 mg | ORAL_TABLET | Freq: Every day | ORAL | 0 refills | Status: AC
Start: 2023-10-31 — End: 2023-11-05

## 2023-10-31 MED ORDER — DOXYCYCLINE HYCLATE 100 MG PO TABS
100.0000 mg | ORAL_TABLET | Freq: Two times a day (BID) | ORAL | 0 refills | Status: AC
Start: 2023-10-31 — End: 2023-11-07

## 2023-10-31 NOTE — Progress Notes (Signed)
 Acute Office Visit  Subjective:     Patient ID: Deborah Branch, female    DOB: 06/16/1935, 89 y.o.   MRN: [id number]  Chief Complaint  Patient presents with   Cerumen Impaction    Cough This is a new problem. The current episode started 1 to 4 weeks ago (2 weeks). The problem has been unchanged. The cough is Productive of sputum (thick, yellow). Pertinent negatives include no chest pain, chills, ear congestion, ear pain, fever, headaches, hemoptysis, myalgias, nasal congestion, sore throat, shortness of breath or wheezing. She has tried oral steroids, a beta-agonist inhaler and steroid inhaler for the symptoms. The treatment provided mild relief. Her past medical history is significant for asthma and pneumonia.   Finished doxycyline and prednisone . Using albuterol  inhaler 1-2x a day due to cough with some short term relief. Nasal congestion, sinus pressure has resolved.   Also here to have ears flushed. She has used debrox OTC.   Review of Systems  Constitutional:  Negative for chills and fever.  HENT:  Negative for ear pain and sore throat.   Respiratory:  Positive for cough. Negative for hemoptysis, shortness of breath and wheezing.   Cardiovascular:  Negative for chest pain.  Musculoskeletal:  Negative for myalgias.  Neurological:  Negative for headaches.       Objective:    BP (!) 124/50   Pulse 70   Temp 98.9 F (37.2 C) (Temporal)   Wt 148 lb (67.1 kg)   SpO2 94%   BMI 28.90 kg/m  SpO2 Readings from Last 3 Encounters:  10/31/23 94%  10/17/23 95%  09/10/23 94%      Physical Exam Vitals and nursing note reviewed.  Constitutional:      General: She is not in acute distress.    Appearance: She is not ill-appearing, toxic-appearing or diaphoretic.  HENT:     Right Ear: Tympanic membrane, ear canal and external ear normal.     Left Ear: Tympanic membrane, ear canal and external ear normal.     Nose: Nose normal.     Mouth/Throat:     Mouth: Mucous membranes are  moist.     Pharynx: Oropharynx is clear. No oropharyngeal exudate or posterior oropharyngeal erythema.  Eyes:     General:        Right eye: No discharge.        Left eye: No discharge.     Conjunctiva/sclera: Conjunctivae normal.  Cardiovascular:     Rate and Rhythm: Normal rate and regular rhythm.     Heart sounds: Normal heart sounds. No murmur heard. Pulmonary:     Effort: Pulmonary effort is normal. No respiratory distress.     Breath sounds: Normal breath sounds. No wheezing, rhonchi or rales.  Musculoskeletal:     Cervical back: Neck supple. No rigidity.     Right lower leg: No edema.     Left lower leg: No edema.  Skin:    General: Skin is warm and dry.  Neurological:     General: No focal deficit present.     Mental Status: She is alert and oriented to person, place, and time.  Psychiatric:        Mood and Affect: Mood normal.        Behavior: Behavior normal.     Ear Cerumen Removal  Date/Time: 10/31/2023 11:04 AM  Performed by: Albertha Huger, FNP Authorized by: Albertha Huger, FNP   Anesthesia: Local Anesthetic: none Location details: right ear and left  ear Patient tolerance: patient tolerated the procedure well with no immediate complications Comments: Normal TMs visualized bilaterally following irrigation.  Procedure type: irrigation  Sedation: Patient sedated: no      No results found for any visits on 10/31/23.      Assessment & Plan:   [name] was seen today for cerumen impaction and cough.  Diagnoses and all orders for this visit:  Productive cough Lungs clear on exam today. Will repeat doxycyline as below.  -     doxycycline  (VIBRA -TABS) 100 MG tablet; Take 1 tablet (100 mg total) by mouth 2 (two) times daily for 7 days.  Moderate persistent asthma with exacerbation With increased cough and albuterol  use. Prednisone  burst as below. Continue advair.  -     predniSONE  (DELTASONE ) 20 MG tablet; Take 2 tablets (40 mg total) by mouth daily  with breakfast for 5 days.  Bilateral impacted cerumen -     Ear Cerumen Removal    Return if symptoms worsen or fail to improve.  The patient indicates understanding of these issues and agrees with the plan.  Albertha Huger, FNP

## 2023-11-05 ENCOUNTER — Encounter (HOSPITAL_COMMUNITY): Payer: Self-pay

## 2023-11-12 ENCOUNTER — Ambulatory Visit: Payer: Medicare PPO | Admitting: Family Medicine

## 2023-11-12 ENCOUNTER — Encounter: Payer: Self-pay | Admitting: Family Medicine

## 2023-11-12 VITALS — BP 137/58 | HR 69 | Temp 98.0°F | Ht 60.0 in | Wt 149.2 lb

## 2023-11-12 DIAGNOSIS — I10 Essential (primary) hypertension: Secondary | ICD-10-CM | POA: Diagnosis not present

## 2023-11-12 DIAGNOSIS — F5104 Psychophysiologic insomnia: Secondary | ICD-10-CM

## 2023-11-12 DIAGNOSIS — J302 Other seasonal allergic rhinitis: Secondary | ICD-10-CM

## 2023-11-12 DIAGNOSIS — J3089 Other allergic rhinitis: Secondary | ICD-10-CM

## 2023-11-12 DIAGNOSIS — J4540 Moderate persistent asthma, uncomplicated: Secondary | ICD-10-CM | POA: Diagnosis not present

## 2023-11-12 DIAGNOSIS — E782 Mixed hyperlipidemia: Secondary | ICD-10-CM

## 2023-11-12 DIAGNOSIS — I5032 Chronic diastolic (congestive) heart failure: Secondary | ICD-10-CM | POA: Diagnosis not present

## 2023-11-12 DIAGNOSIS — R7303 Prediabetes: Secondary | ICD-10-CM | POA: Diagnosis not present

## 2023-11-12 DIAGNOSIS — M810 Age-related osteoporosis without current pathological fracture: Secondary | ICD-10-CM | POA: Diagnosis not present

## 2023-11-12 DIAGNOSIS — Z23 Encounter for immunization: Secondary | ICD-10-CM

## 2023-11-12 LAB — BAYER DCA HB A1C WAIVED: HB A1C (BAYER DCA - WAIVED): 6 % — ABNORMAL HIGH (ref 4.8–5.6)

## 2023-11-12 MED ORDER — MONTELUKAST SODIUM 10 MG PO TABS
10.0000 mg | ORAL_TABLET | Freq: Every day | ORAL | 3 refills | Status: DC
Start: 2023-11-12 — End: 2024-03-26

## 2023-11-12 NOTE — Progress Notes (Signed)
 Established Patient Office Visit  Subjective   Patient ID: Deborah Branch, female    DOB: [birth year]  Age: 89 y.o. MRN: [id number]  Chief Complaint  Patient presents with   Medical Management of Chronic Issues    HPI  HTN Complaint with meds - Yes Current Medications - amlodipine , lisinopril  Pertinent ROS:  Headache - No Fatigue - No Visual Disturbances - No Chest pain - No Dyspnea - No Palpitations - No LE edema - No  2. Prediabetes/HLD Regular diet. No exercise.   3. Osteoporosis No exercise right now due to pollen with asthma. On prolia , oscal supplement.   4. GERD No symptoms with PPI.   5. Asthma Increased cough for weeks. White sputum. Tightness in chest with the cough sometimes. Using nebulizer 1-2x a day with relief. Saw pulmonology in March prior to exacerbation of symptoms. Recently treated with prednisone . Taking antihistamines with some improvement.   6. Insomnia Taking trazodone  without side effects. Usually takes up around 3 am, sometimes has trouble going back to sleep.      ROS    Objective:     BP (!) 137/58   Pulse 69   Temp 98 F (36.7 C) (Temporal)   Ht 5' (1.524 m)   Wt 149 lb 3.2 oz (67.7 kg)   SpO2 94%   BMI 29.14 kg/m    Physical Exam Vitals and nursing note reviewed.  Constitutional:      General: She is not in acute distress.    Appearance: Normal appearance. She is not ill-appearing.  Cardiovascular:     Rate and Rhythm: Normal rate and regular rhythm.     Pulses: Normal pulses.     Heart sounds: Normal heart sounds. No murmur heard. Pulmonary:     Effort: Pulmonary effort is normal. No respiratory distress.     Breath sounds: Normal breath sounds.  Abdominal:     General: Bowel sounds are normal. There is no distension.     Palpations: Abdomen is soft. There is no mass.     Tenderness: There is no abdominal tenderness. There is no guarding or rebound.  Musculoskeletal:     Cervical back: Neck supple. No  tenderness.     Right lower leg: No edema.     Left lower leg: No edema.  Lymphadenopathy:     Cervical: No cervical adenopathy.  Skin:    General: Skin is warm and dry.  Neurological:     General: No focal deficit present.     Mental Status: She is alert and oriented to person, place, and time.  Psychiatric:        Mood and Affect: Mood normal.        Behavior: Behavior normal.      No results found for any visits on 11/12/23.    The ASCVD Risk score (Arnett DK, et al., 2019) failed to calculate for the following reasons:   The 2019 ASCVD risk score is only valid for ages 70 to 46    Assessment & Plan:   Deborah Branch was seen today for medical management of chronic issues.  Diagnoses and all orders for this visit:  Primary hypertension Well controlled on current regimen.   Chronic heart failure with preserved ejection fraction (HFpEF) (HCC) Euvolemic on exam.   Mixed hyperlipidemia On statin.   Prediabetes A1c 6.1 today. Diet, exercise, weight loss.  -     Bayer DCA Hb A1c Waived  Chronic insomnia Continue trazodone  prn.   Moderate persistent asthma  without complication Not well controlled. Will try adding singulair . Follow up with pulmonology if symptoms don't improve.  -     montelukast  (SINGULAIR ) 10 MG tablet; Take 1 tablet (10 mg total) by mouth at bedtime.  Seasonal and perennial allergic rhinitis -     montelukast  (SINGULAIR ) 10 MG tablet; Take 1 tablet (10 mg total) by mouth at bedtime.  Age related osteoporosis, unspecified pathological fracture presence Continue prolia , oscal.   Need for vaccination -     Zoster Recombinant (Shingrix  )   Return in about 6 months (around 05/14/2024) for CPE.   The patient indicates understanding of these issues and agrees with the plan.  Deborah Huger, FNP

## 2023-11-14 ENCOUNTER — Telehealth: Payer: Self-pay

## 2023-11-14 ENCOUNTER — Ambulatory Visit: Payer: Self-pay | Admitting: Family Medicine

## 2023-11-14 NOTE — Telephone Encounter (Signed)
Noted  -LS

## 2023-11-14 NOTE — Telephone Encounter (Signed)
 Labs reviewed by [facility] nurse. Documented in separate encounter. LS

## 2023-11-14 NOTE — Telephone Encounter (Signed)
 Copied from CRM [id number]. Topic: Clinical - Lab/Test Results >> Nov 14, 2023 12:49 PM Donald Frost wrote: Reason for CRM: The patient called in to get her lab results and I relayed what her provider said. She said thank you.

## 2023-11-16 ENCOUNTER — Telehealth: Payer: Self-pay

## 2023-11-16 ENCOUNTER — Ambulatory Visit: Payer: Self-pay | Admitting: Internal Medicine

## 2023-11-16 MED ORDER — PREDNISONE 10 MG PO TABS: ORAL_TABLET | ORAL | 0 refills | Status: AC

## 2023-11-16 MED ORDER — DOXYCYCLINE HYCLATE 100 MG PO TABS
100.0000 mg | ORAL_TABLET | Freq: Two times a day (BID) | ORAL | 0 refills | Status: DC
Start: 2023-11-16 — End: 2024-05-14

## 2023-11-16 NOTE — Addendum Note (Signed)
 Addended by: Lind Repine on: 11/16/2023 03:30 PM   Modules accepted: Orders

## 2023-11-16 NOTE — Telephone Encounter (Signed)
 Yellow-green sputum Shortness of breath with wheezing  Used albuterol  nebs with some relief. May be "allergies". No fevers  Sent in doxycycline  and prednisone  20 mg taper

## 2023-11-16 NOTE — Telephone Encounter (Signed)
 Chief Complaint: productive cough Symptoms: SOB, wheezing, chest tightness Frequency: x few weeks, worsening since yesterday Pertinent Negatives: Patient denies fever, CP, severe SOB Disposition: [] ED /[x] Urgent Care (no appt availability in office) / [] Appointment(In office/virtual)/ []  [facility]/ [] Home Care/ [] Refused Recommended Disposition /[] [facility]/ [x]  Follow-up with Pulm Additional Notes: Pt c/o productive green/yellow cough, exertional SOB/wheezing, and chest tightness that has been going on for the past few weeks, and has worsened since yesterday. Pt reports seeing PCP earlier this week and was instructed to use Singulair  that did not provide relief. Pt reports taking respiratory medications as instructed, as well as albuterol . Triager reinforced albuterol  NEB usage. Triager unable to schedule with [facility] d/t access. Triager will forward encounter for Dr. Linder Revere 's office to review and advise. Patient verbalized understanding and is expecting call back from office for next steps. Triager also advised that if pt does not hear back from office, to follow disposition for further evaluation/treatment.     Copied from CRM [id number]. Topic: Clinical - Red Word Triage >> Nov 16, 2023 12:26 PM Eveleen Hinds B wrote: [facility] that prompted transfer to Nurse Triage: Persistant cough, chest tighness, Reason for Disposition  [1] Known COPD or other severe lung disease (i.e., bronchiectasis, cystic fibrosis, lung surgery) AND [2] worsening symptoms (i.e., increased sputum purulence or amount, increased breathing difficulty  Answer Assessment - Initial Assessment Questions E2C2 Pulmonary Triage - Initial Assessment Questions "Chief Complaint (e.g., cough, sob, wheezing, fever, chills, sweat or additional symptoms) *Go to specific symptom protocol after initial questions. Persistent yellow/green cough, chest tightness Pt reports seeing PCP recently for the past few weeks -    "How long have symptoms been present?" Early this AM  Have you tested for COVID or Flu? Note: If not, ask patient if a home test can be taken. If so, instruct patient to call back for positive results. No  MEDICINES:   "Have you used any OTC meds to help with symptoms?" Yes If yes, ask "What medications?" Singulair   "Have you used your inhalers/maintenance medication?" Yes If yes, "What medications?" Albuterol  INH PRN - last used this AM fluticasone -salmeterol - 1 puff twice a day INH provides some relief  If inhaler, ask "How many puffs and how often?" Note: Review instructions on medication in the chart. See above  OXYGEN: "Do you wear supplemental oxygen?" No If yes, "How many liters are you supposed to use?" N/a  "Do you monitor your oxygen levels?" Yes If yes, "What is your reading (oxygen level) today?" 94     3. SPUTUM: "Describe the color of your sputum" (none, dry cough; clear, white, yellow, green)     Yellow, green 4. HEMOPTYSIS: "Are you coughing up any blood?" If so ask: "How much?" (flecks, streaks, tablespoons, etc.)     denies 5. DIFFICULTY BREATHING: "Are you having difficulty breathing?" If Yes, ask: "How bad is it?" (e.g., mild, moderate, severe)    - MILD: No SOB at rest, mild SOB with walking, speaks normally in sentences, can lie down, no retractions, pulse < 100.    - MODERATE: SOB at rest, SOB with minimal exertion and prefers to sit, cannot lie down flat, speaks in phrases, mild retractions, audible wheezing, pulse 100-120.    - SEVERE: Very SOB at rest, speaks in single words, struggling to breathe, sitting hunched forward, retractions, pulse > 120      Mild - exertional 6. FEVER: "Do you have a fever?" If Yes, ask: "What is your temperature,  how was it measured, and when did it start?"     denies 7. CARDIAC HISTORY: "Do you have any history of heart disease?" (e.g., heart attack, congestive heart failure)      CHF 8. LUNG HISTORY: "Do you  have any history of lung disease?"  (e.g., pulmonary embolus, asthma, emphysema)     asthma 9. PE RISK FACTORS: "Do you have a history of blood clots?" (or: recent major surgery, recent prolonged travel, bedridden)     denies 10. OTHER SYMPTOMS: "Do you have any other symptoms?" (e.g., runny nose, wheezing, chest pain)       Wheezing, chest tightness  Protocols used: Cough - Acute Productive-A-AH

## 2023-11-16 NOTE — Telephone Encounter (Signed)
 Copied from CRM [id number]. Topic: Appointments - Appointment Scheduling >> Nov 16, 2023 10:16 AM Emylou G wrote: patient was given: montelukast  (SINGULAIR ) 10 MG tablet.. cough is not getting better..  Not sure what to do she says?

## 2023-11-16 NOTE — Telephone Encounter (Signed)
 Per Tiffany pt needs to call her pulmonologist for further treatment/evaluation and pt voiced understanding.

## 2023-11-21 ENCOUNTER — Encounter: Attending: Internal Medicine | Admitting: *Deleted

## 2023-11-21 VITALS — BP 187/63 | HR 75 | Temp 98.2°F

## 2023-11-21 DIAGNOSIS — J4540 Moderate persistent asthma, uncomplicated: Secondary | ICD-10-CM

## 2023-11-21 DIAGNOSIS — I5032 Chronic diastolic (congestive) heart failure: Secondary | ICD-10-CM | POA: Insufficient documentation

## 2023-11-21 DIAGNOSIS — I10 Essential (primary) hypertension: Secondary | ICD-10-CM | POA: Insufficient documentation

## 2023-11-21 DIAGNOSIS — R42 Dizziness and giddiness: Secondary | ICD-10-CM | POA: Insufficient documentation

## 2023-11-21 MED ORDER — OMALIZUMAB 75 MG/0.5ML ~~LOC~~ SOSY
225.0000 mg | PREFILLED_SYRINGE | Freq: Once | SUBCUTANEOUS | Status: AC
  Administered 2023-11-21: 225 mg via SUBCUTANEOUS

## 2023-11-21 NOTE — Progress Notes (Signed)
 Diagnosis: Asthma  Provider:  Rosa College, MD  Procedure: Injection  Xolair  (Omalizumab ), Dose: 225 mg, Site: subcutaneous, Number of injections: 2  Injection Site(s): Left arm and Right arm  75 mg left arm and 150 mg right arm  Post Care: Observation period completed  Discharge: Condition: Good, Destination: Home . AVS Provided  Performed by:  Verneda Golder, RN

## 2023-11-22 ENCOUNTER — Emergency Department (HOSPITAL_BASED_OUTPATIENT_CLINIC_OR_DEPARTMENT_OTHER)

## 2023-11-22 ENCOUNTER — Other Ambulatory Visit: Payer: Self-pay

## 2023-11-22 ENCOUNTER — Emergency Department (HOSPITAL_BASED_OUTPATIENT_CLINIC_OR_DEPARTMENT_OTHER)
Admission: EM | Admit: 2023-11-22 | Discharge: 2023-11-22 | Disposition: A | Discharge status: Home / Self Care | Attending: Emergency Medicine | Admitting: Emergency Medicine

## 2023-11-22 ENCOUNTER — Encounter (HOSPITAL_BASED_OUTPATIENT_CLINIC_OR_DEPARTMENT_OTHER): Payer: Self-pay | Admitting: Emergency Medicine

## 2023-11-22 DIAGNOSIS — Z87891 Personal history of nicotine dependence: Secondary | ICD-10-CM | POA: Insufficient documentation

## 2023-11-22 DIAGNOSIS — Z79899 Other long term (current) drug therapy: Secondary | ICD-10-CM | POA: Diagnosis not present

## 2023-11-22 DIAGNOSIS — I6782 Cerebral ischemia: Secondary | ICD-10-CM | POA: Diagnosis not present

## 2023-11-22 DIAGNOSIS — Z8673 Personal history of transient ischemic attack (TIA), and cerebral infarction without residual deficits: Secondary | ICD-10-CM | POA: Diagnosis not present

## 2023-11-22 DIAGNOSIS — I110 Hypertensive heart disease with heart failure: Secondary | ICD-10-CM | POA: Insufficient documentation

## 2023-11-22 DIAGNOSIS — Z7952 Long term (current) use of systemic steroids: Secondary | ICD-10-CM | POA: Diagnosis not present

## 2023-11-22 DIAGNOSIS — I10 Essential (primary) hypertension: Secondary | ICD-10-CM

## 2023-11-22 DIAGNOSIS — J45909 Unspecified asthma, uncomplicated: Secondary | ICD-10-CM | POA: Diagnosis not present

## 2023-11-22 DIAGNOSIS — R42 Dizziness and giddiness: Secondary | ICD-10-CM

## 2023-11-22 DIAGNOSIS — I509 Heart failure, unspecified: Secondary | ICD-10-CM | POA: Insufficient documentation

## 2023-11-22 DIAGNOSIS — Z7951 Long term (current) use of inhaled steroids: Secondary | ICD-10-CM | POA: Diagnosis not present

## 2023-11-22 DIAGNOSIS — G319 Degenerative disease of nervous system, unspecified: Secondary | ICD-10-CM | POA: Diagnosis not present

## 2023-11-22 DIAGNOSIS — J341 Cyst and mucocele of nose and nasal sinus: Secondary | ICD-10-CM | POA: Diagnosis not present

## 2023-11-22 LAB — COMPREHENSIVE METABOLIC PANEL WITH GFR
ALT: 11 U/L (ref 0–44)
AST: 16 U/L (ref 15–41)
Albumin: 4 g/dL (ref 3.5–5.0)
Alkaline Phosphatase: 51 U/L (ref 38–126)
Anion gap: 12 (ref 5–15)
BUN: 19 mg/dL (ref 8–23)
CO2: 26 mmol/L (ref 22–32)
Calcium: 10.8 mg/dL — ABNORMAL HIGH (ref 8.9–10.3)
Chloride: 102 mmol/L (ref 98–111)
Creatinine, Ser: 1.02 mg/dL — ABNORMAL HIGH (ref 0.44–1.00)
GFR, Estimated: 53 mL/min — ABNORMAL LOW (ref >60)
Glucose, Bld: 118 mg/dL — ABNORMAL HIGH (ref 70–99)
Potassium: 4.2 mmol/L (ref 3.5–5.1)
Sodium: 140 mmol/L (ref 135–145)
Total Bilirubin: 0.2 mg/dL (ref 0.0–1.2)
Total Protein: 6.6 g/dL (ref 6.5–8.1)

## 2023-11-22 LAB — CBC WITH DIFFERENTIAL/PLATELET
Abs Immature Granulocytes: 0.17 10*3/uL — ABNORMAL HIGH (ref 0.00–0.07)
Basophils Absolute: 0.1 10*3/uL (ref 0.0–0.1)
Basophils Relative: 1 %
Eosinophils Absolute: 0 10*3/uL (ref 0.0–0.5)
Eosinophils Relative: 0 %
HCT: 39.7 % (ref 36.0–46.0)
Hemoglobin: 13 g/dL (ref 12.0–15.0)
Immature Granulocytes: 2 %
Lymphocytes Relative: 18 %
Lymphs Abs: 1.7 10*3/uL (ref 0.7–4.0)
MCH: 28.2 pg (ref 26.0–34.0)
MCHC: 32.7 g/dL (ref 30.0–36.0)
MCV: 86.1 fL (ref 80.0–100.0)
Monocytes Absolute: 0.5 10*3/uL (ref 0.1–1.0)
Monocytes Relative: 5 %
Neutro Abs: 7.4 10*3/uL (ref 1.7–7.7)
Neutrophils Relative %: 74 %
Platelets: 300 10*3/uL (ref 150–400)
RBC: 4.61 MIL/uL (ref 3.87–5.11)
RDW: 14.2 % (ref 11.5–15.5)
WBC: 9.8 10*3/uL (ref 4.0–10.5)
nRBC: 0 % (ref 0.0–0.2)

## 2023-11-22 LAB — URINALYSIS, W/ REFLEX TO CULTURE (INFECTION SUSPECTED)
Bacteria, UA: NONE SEEN
Bilirubin Urine: NEGATIVE
Glucose, UA: NEGATIVE mg/dL
Ketones, ur: NEGATIVE mg/dL
Leukocytes,Ua: NEGATIVE
Nitrite: NEGATIVE
Protein, ur: NEGATIVE mg/dL
Specific Gravity, Urine: <1.005 — ABNORMAL LOW (ref 1.005–1.030)
pH: 7.5 (ref 5.0–8.0)

## 2023-11-22 LAB — CBG MONITORING, ED: Glucose-Capillary: 134 mg/dL — ABNORMAL HIGH (ref 70–99)

## 2023-11-22 MED ORDER — MECLIZINE HCL 25 MG PO TABS
25.0000 mg | ORAL_TABLET | Freq: Once | ORAL | Status: AC
  Administered 2023-11-22: 25 mg via ORAL
  Filled 2023-11-22: qty 1

## 2023-11-22 MED ORDER — MECLIZINE HCL 25 MG PO TABS
25.0000 mg | ORAL_TABLET | Freq: Three times a day (TID) | ORAL | 0 refills | Status: AC | PRN
Start: 2023-11-22 — End: ?

## 2023-11-22 MED ORDER — LORAZEPAM 1 MG PO TABS
0.5000 mg | ORAL_TABLET | Freq: Once | ORAL | Status: AC
  Administered 2023-11-22: 0.5 mg via ORAL
  Filled 2023-11-22: qty 1

## 2023-11-22 NOTE — ED Notes (Signed)
 Medications being held, per Provider... Meds specifically for the MRI.Aaron AasAaron Aas

## 2023-11-22 NOTE — Discharge Instructions (Addendum)
 For the concern for your blood pressure care, measure your blood pressure once in the morning, and once in the evening and keep a log of the measurements. Take this information to cardiology to assist with evaluation and potential medication adjustments.   For the dizziness, felt to be Vertigo, take Meclizine as prescribed. You can follow up with Dr. Lydia Sams with [facility] Th[facility] if symptoms persist.   Return to the ED with any new or concerning symptoms at any time.

## 2023-11-22 NOTE — ED Notes (Signed)
 CBG 134,

## 2023-11-22 NOTE — ED Provider Notes (Signed)
 [place] EMERGENCY DEPARTMENT AT [facility] Provider Note   CSN: [id number] Arrival date & time: 11/22/23  1229     History  Chief Complaint  Patient presents with   Dizziness    Deborah Branch is a 89 y.o. female.  Patient to ED with dizziness described as surroundings moving for the past 3 days. No headache, visual change, pain, nausea. She has been treated for a recent cough and is now on prednisone  with symptoms improving. She also reports hearing changes in the right ear, also being evaluated and treated in the outpatient setting. No history of stroke. She does not feel off balance when walking. She denies weakness in the arms or legs. She reports her blood pressure was found to be elevated yesterday, per son, to a systolic of 250. She was given an additional Lisinopril  with improvement.   The history is provided by the patient and a relative. No language interpreter was used.  Dizziness      Home Medications Prior to Admission medications   Medication Sig Start Date End Date Taking? Authorizing Provider  meclizine (ANTIVERT) 25 MG tablet Take 1 tablet (25 mg total) by mouth 3 (three) times daily as needed for dizziness. 11/22/23  Yes Harrington Jobe, Clovis Dar, PA-C  albuterol  (VENTOLIN  HFA) 108 (90 Base) MCG/ACT inhaler Inhale 2 puffs into the lungs every 6 (six) hours as needed for wheezing or shortness of breath. 10/17/23   Milian, Winda Hastings, FNP  amLODipine  (NORVASC ) 5 MG tablet TAKE 1 TABLET BY MOUTH ONCE DAILY 06/08/23   O'Neal, Cathay Clonts, MD  calcium  carbonate (OSCAL) 1500 (600 Ca) MG TABS tablet Take 1 tablet by mouth 2 (two) times daily with a meal.    [provider]  cetirizine (ZYRTEC) 10 MG tablet Take 10 mg by mouth daily.    [provider]  denosumab  (PROLIA ) 60 MG/ML SOSY injection Inject 60 mg into the skin every 6 (six) months. 04/04/23   Albertha Huger, FNP  doxycycline  (VIBRA -TABS) 100 MG tablet Take 1 tablet (100 mg total) by  mouth 2 (two) times daily. 11/16/23   Lind Repine, MD  fluticasone -salmeterol (ADVAIR DISKUS) 100-50 MCG/ACT AEPB Inhale 1 puff into the lungs 2 (two) times daily. 08/02/23   Rosa College D, MD  fluticasone -salmeterol (ADVAIR) 100-50 MCG/ACT AEPB INHALE 1 PUFF into THE lungs TWICE DAILY 08/02/23   Rosa College D, MD  furosemide  (LASIX ) 20 MG tablet TAKE 1 TABLET BY MOUTH DAILY 10/01/23   Albertha Huger, FNP  ipratropium-albuterol  (DUONEB) 0.5-2.5 (3) MG/3ML SOLN Take 3 mLs by nebulization every 6 (six) hours as needed (shortness of breath and cough). 10/17/23   Milian, Winda Hastings, FNP  lisinopril  (ZESTRIL ) 40 MG tablet Take 1 tablet (40 mg total) by mouth daily. 03/21/23 03/20/24  Albertha Huger, FNP  loratadine (CLARITIN REDITABS) 10 MG dissolvable tablet Take 10 mg by mouth daily.    [provider]  Menaquinone-7 (VITAMIN K2) 100 MCG CAPS Take 1 tablet by mouth daily.    [provider]  montelukast  (SINGULAIR ) 10 MG tablet Take 1 tablet (10 mg total) by mouth at bedtime. 11/12/23   Albertha Huger, FNP  Omalizumab  (XOLAIR  [place]) Inject 225 mg into the skin every 3 (three) months.    [provider]  omeprazole  (PRILOSEC) 20 MG capsule Take 1 capsule (20 mg total) by mouth daily. 03/21/23   Albertha Huger, FNP  predniSONE  (DELTASONE ) 10 MG tablet Take 2 tabs daily with food x 5ds,  then 1 tab daily with food x 5ds then STOP 11/16/23   Lind Repine, MD  simvastatin  (ZOCOR ) 20 MG tablet Take 1 tablet (20 mg total) by mouth at bedtime. 03/21/23   Albertha Huger, FNP  traZODone  (DESYREL ) 50 MG tablet TAKE ONE-HALF TO 1 TABLET BY MOUTH AT BEDTIME AS NEEDED FOR SLEEP 08/24/23   Albertha Huger, FNP      Allergies    Clarithromycin, Cleocin [clindamycin], and Penicillins    Review of Systems   Review of Systems  Neurological:  Positive for dizziness.    Physical Exam Updated Vital Signs BP 132/63   Pulse 60   Temp 98.3 F (36.8 C) (Oral)   Resp 15    SpO2 95%  Physical Exam Vitals and nursing note reviewed.  Constitutional:      Appearance: She is well-developed.  HENT:     Head: Normocephalic.  Cardiovascular:     Rate and Rhythm: Normal rate and regular rhythm.     Heart sounds: Murmur heard.  Pulmonary:     Effort: Pulmonary effort is normal.     Breath sounds: Normal breath sounds. No wheezing, rhonchi or rales.  Abdominal:     General: Bowel sounds are normal.     Palpations: Abdomen is soft.     Tenderness: There is no abdominal tenderness. There is no guarding or rebound.  Musculoskeletal:        General: Normal range of motion.     Cervical back: Normal range of motion and neck supple.  Skin:    General: Skin is warm and dry.  Neurological:     General: No focal deficit present.     Mental Status: She is alert and oriented to person, place, and time.     GCS: GCS eye subscore is 4. GCS verbal subscore is 5. GCS motor subscore is 6.     Cranial Nerves: No cranial nerve deficit, dysarthria or facial asymmetry.     Sensory: Sensation is intact.     Motor: No weakness or pronator drift.     Coordination: Finger-Nose-Finger Test normal.     Gait: Gait normal.     ED Results / Procedures / Treatments   Labs (all labs ordered are listed, but only abnormal results are displayed) Labs Reviewed  CBC WITH DIFFERENTIAL/PLATELET - Abnormal; Notable for the following components:      Result Value   Abs Immature Granulocytes 0.17 (*)    All other components within normal limits  COMPREHENSIVE METABOLIC PANEL WITH GFR - Abnormal; Notable for the following components:   Glucose, Bld 118 (*)    Creatinine, Ser 1.02 (*)    Calcium  10.8 (*)    GFR, Estimated 53 (*)    All other components within normal limits  URINALYSIS, W/ REFLEX TO CULTURE (INFECTION SUSPECTED) - Abnormal; Notable for the following components:   Color, Urine COLORLESS (*)    Specific Gravity, Urine <1.005 (*)    Hgb urine dipstick SMALL (*)    All other  components within normal limits  CBG MONITORING, ED - Abnormal; Notable for the following components:   Glucose-Capillary 134 (*)    All other components within normal limits   Results for orders placed or performed during the hospital encounter of 11/22/23  CBG monitoring, ED   Collection Time: 11/22/23 12:43 PM  Result Value Ref Range   Glucose-Capillary 134 (H) 70 - 99 mg/dL  Urinalysis, w/ Reflex to Culture (Infection Suspected) -Urine, Clean Catch  Collection Time: 11/22/23  1:18 PM  Result Value Ref Range   Specimen Source URINE, CLEAN CATCH    Color, Urine COLORLESS (A) YELLOW   APPearance CLEAR CLEAR   Specific Gravity, Urine <1.005 (L) 1.005 - 1.030   pH 7.5 5.0 - 8.0   Glucose, UA NEGATIVE NEGATIVE mg/dL   Hgb urine dipstick SMALL (A) NEGATIVE   Bilirubin Urine NEGATIVE NEGATIVE   Ketones, ur NEGATIVE NEGATIVE mg/dL   Protein, ur NEGATIVE NEGATIVE mg/dL   Nitrite NEGATIVE NEGATIVE   Leukocytes,Ua NEGATIVE NEGATIVE   RBC / HPF 0-5 0 - 5 RBC/hpf   WBC, UA 0-5 0 - 5 WBC/hpf   Bacteria, UA NONE SEEN NONE SEEN   Squamous Epithelial / HPF 0-5 0 - 5 /HPF  CBC with Differential   Collection Time: 11/22/23  1:35 PM  Result Value Ref Range   WBC 9.8 4.0 - 10.5 K/uL   RBC 4.61 3.87 - 5.11 MIL/uL   Hemoglobin 13.0 12.0 - 15.0 g/dL   HCT 40.9 81.1 - 91.4 %   MCV 86.1 80.0 - 100.0 fL   MCH 28.2 26.0 - 34.0 pg   MCHC 32.7 30.0 - 36.0 g/dL   RDW 78.2 95.6 - 21.3 %   Platelets 300 150 - 400 K/uL   nRBC 0.0 0.0 - 0.2 %   Neutrophils Relative % 74 %   Neutro Abs 7.4 1.7 - 7.7 K/uL   Lymphocytes Relative 18 %   Lymphs Abs 1.7 0.7 - 4.0 K/uL   Monocytes Relative 5 %   Monocytes Absolute 0.5 0.1 - 1.0 K/uL   Eosinophils Relative 0 %   Eosinophils Absolute 0.0 0.0 - 0.5 K/uL   Basophils Relative 1 %   Basophils Absolute 0.1 0.0 - 0.1 K/uL   Immature Granulocytes 2 %   Abs Immature Granulocytes 0.17 (H) 0.00 - 0.07 K/uL  Comprehensive metabolic panel   Collection Time:  11/22/23  1:35 PM  Result Value Ref Range   Sodium 140 135 - 145 mmol/L   Potassium 4.2 3.5 - 5.1 mmol/L   Chloride 102 98 - 111 mmol/L   CO2 26 22 - 32 mmol/L   Glucose, Bld 118 (H) 70 - 99 mg/dL   BUN 19 8 - 23 mg/dL   Creatinine, Ser 0.86 (H) 0.44 - 1.00 mg/dL   Calcium  10.8 (H) 8.9 - 10.3 mg/dL   Total Protein 6.6 6.5 - 8.1 g/dL   Albumin 4.0 3.5 - 5.0 g/dL   AST 16 15 - 41 U/L   ALT 11 0 - 44 U/L   Alkaline Phosphatase 51 38 - 126 U/L   Total Bilirubin 0.2 0.0 - 1.2 mg/dL   GFR, Estimated 53 (L) >60 mL/min   Anion gap 12 5 - 15    EKG EKG Interpretation Date/Time:  Thursday Nov 22 2023 12:40:37 EDT Ventricular Rate:  67 PR Interval:  191 QRS Duration:  86 QT Interval:  401 QTC Calculation: 424 R Axis:   78  Text Interpretation: Sinus rhythm Minimal ST depression, inferior leads No significant change since last tracing Confirmed by Scarlette Currier ([id number]) on 11/22/2023 12:50:52 PM  Radiology MR BRAIN WO CONTRAST Result Date: 11/22/2023 CLINICAL DATA:  Provided history: Neuro deficit, acute, stroke suspected. EXAM: MRI HEAD WITHOUT CONTRAST TECHNIQUE: Multiplanar, multiecho pulse sequences of the brain and surrounding structures were obtained without intravenous contrast. COMPARISON:  None. FINDINGS: Brain: Mild-to-moderate generalized cerebral atrophy. Commensurate prominence of the ventricles and sulci. Multifocal T2 FLAIR hyperintense signal abnormality  within the cerebral white matter, nonspecific but compatible with mild-to-moderate chronic small vessel ischemic disease. Mildly low-lying cerebellar tonsils (extending up to 4 mm below the level of the foramen magnum). There is no acute infarct. No evidence of an intracranial mass. No chronic intracranial blood products. No extra-axial fluid collection. No midline shift. Vascular: Maintained flow voids within the proximal large arterial vessels. Skull and upper cervical spine: No focal worrisome marrow lesion. Incompletely  assessed cervical spondylosis. Ligamentous hypertrophy about the dens. C4-C5 vertebral ankylosis. Sinuses/Orbits: No mass or acute finding within the imaged orbits. Prior bilateral ocular lens replacement. Tiny mucous retention cyst within the left maxillary sinus. IMPRESSION: 1.  No acute intracranial finding. 2. Mild-to-moderate chronic small vessel ischemic changes within the cerebral white matter. 3. Mild-to-moderate generalized cerebral atrophy. 4. Mildly low-lying cerebellar tonsils. Electronically Signed   By: Bascom Lily D.O.   On: 11/22/2023 17:37    Procedures Procedures    Medications Ordered in ED Medications  LORazepam  (ATIVAN ) tablet 0.5 mg (0.5 mg Oral Given 11/22/23 1450)  meclizine  (ANTIVERT ) tablet 25 mg (25 mg Oral Given 11/22/23 1450)    ED Course/ Medical Decision Making/ A&P                                 Medical Decision Making This patient presents to the ED for concern of dizziness, this involves an extensive number of treatment options, and is a complaint that carries with it a high risk of complications and morbidity.  The differential diagnosis includes CVA/TIA, vertigo, SAH/SDH, meningitis, infection/sepsis   Co morbidities that complicate the patient evaluation  HLD, HTN, GERD, CHF, asthma   Additional history obtained:  Additional history and/or information obtained from chart review, notable for no recent ED visits   Lab Tests:  I Ordered, and personally interpreted labs.  The pertinent results include:   UA - negative for infection CBC - within normal limits Cmet - no electrolyte abnormalities, cr 1.02     Imaging Studies ordered:  I ordered imaging studies including MRI brain Per radiologist interpretation  IMPRESSION: 1.  No acute intracranial finding. 2. Mild-to-moderate chronic small vessel ischemic changes within the cerebral white matter. 3. Mild-to-moderate generalized cerebral atrophy. 4. Mildly low-lying cerebellar  tonsils.   Cardiac Monitoring:  The patient was maintained on a cardiac monitor.  I personally viewed and interpreted the cardiac monitored which showed an underlying rhythm of:  EKG Interpretation Date/Time:  Thursday Nov 22 2023 12:40:37 EDT Ventricular Rate:  67 PR Interval:  191 QRS Duration:  86 QT Interval:  401 QTC Calculation: 424 R Axis:   78  Text Interpretation: Sinus rhythm Minimal ST depression, inferior leads No significant change since last tracing Confirmed by Scarlette Currier ([id number]) on 11/22/2023 12:50:52 PM   Medicines ordered and prescription drug management:  I ordered medication including Meclizine   for dizziness Reevaluation of the patient after these medicines showed that the patient improved I have reviewed the patients home medicines and have made adjustments as needed   Test Considered:  N/a   Critical Interventions:  N/a   Consultations Obtained:  I requested consultation with the n/a,  and discussed lab and imaging findings as well as pertinent plan - they recommend: n/a   Problem List / ED Course:  Here with dizziness described as vertiginous No neuro deficits on exam Meclizine  with improvement Labs reassuring  15:55 - Pending MRI read 17:35 - pending MRI  read  17:40 - MRI negative for acute findings. Patient is feeling better. Symptoms consistent with vertigo, better with Meclizine, negative MRI. VSS. No significant HTN in ED - continue regular medications. Rx Meclizine for home.    Reevaluation:  After the interventions noted above, I reevaluated the patient and found that they have :improved   Social Determinants of Health:  Former smoker   Disposition:  After consideration of the diagnostic results and the patients response to treatment, I feel that the patient would benefit from discharge home.   Amount and/or Complexity of Data Reviewed Labs: ordered. Radiology: ordered.  Risk Prescription drug  management.           Final Clinical Impression(s) / ED Diagnoses Final diagnoses:  Vertigo  Hypertension, unspecified type    Rx / DC Orders ED Discharge Orders          Ordered    meclizine (ANTIVERT) 25 MG tablet  3 times daily PRN        11/22/23 1753              Mandy Second, PA-C 11/22/23 1757    Scarlette Currier, MD 11/22/23 2237

## 2023-11-22 NOTE — ED Triage Notes (Signed)
 Dizziness x a few days, cough

## 2023-11-23 ENCOUNTER — Telehealth: Payer: Self-pay

## 2023-11-23 ENCOUNTER — Encounter: Payer: Self-pay | Admitting: Cardiology

## 2023-11-23 ENCOUNTER — Encounter: Admitting: Cardiology

## 2023-11-23 ENCOUNTER — Encounter: Payer: Self-pay | Admitting: Family Medicine

## 2023-11-23 VITALS — BP 158/70 | HR 84 | Ht 60.0 in | Wt 150.6 lb

## 2023-11-23 DIAGNOSIS — R42 Dizziness and giddiness: Secondary | ICD-10-CM

## 2023-11-23 DIAGNOSIS — I10 Essential (primary) hypertension: Secondary | ICD-10-CM

## 2023-11-23 DIAGNOSIS — I5032 Chronic diastolic (congestive) heart failure: Secondary | ICD-10-CM

## 2023-11-23 MED ORDER — AMLODIPINE BESYLATE 10 MG PO TABS: 10.0000 mg | ORAL_TABLET | Freq: Every day | ORAL | 6 refills | Status: DC

## 2023-11-23 NOTE — Telephone Encounter (Signed)
 I spoke to pt and and granddaughter and advised for us  to put referral in for ENT she ntbs in office. Scheduled pt with Tiffany Monday 11/26/2023 at 11:15.

## 2023-11-23 NOTE — Addendum Note (Signed)
 Addended by: Zaraya Delauder G on: 11/23/2023 02:35 PM   Modules accepted: Orders

## 2023-11-23 NOTE — Telephone Encounter (Signed)
 Copied from CRM [id number]. Topic: Appointments - Scheduling Inquiry for Clinic >> Nov 23, 2023 10:39 AM Ivette P wrote: Reason for CRM: Pt Granddaughter Nellie Banas called in requesting why an ER appt is needed if pt is being handled by Cardio for the reason of the ER visit.   Pt was in ER for blood pressure medication and wants this to be handled by Cardio   Nellie Banas would like to know why an appt is needed if pt was recently seen for the issue with primary on 10/31/2023 for the ear and having vertigo.   Nellie Banas is using pt mychart to message and was told needed to schedule follow up for referral and elizabeth wants to know why?  Attempted to schedule follow up - elizabeth declined.   Please call elizabeth to follow up and schedule hospital follow up [phone]   Mychart messaging script copied into CRM. CRM Message:  "Shaune Delaine to P [facility] (supporting Albertha Huger, FNP)     11/23/23  9:27 AM I had to go to the ER yesterday for high blood pressure and dizziness. Blood pressure came down but they told me they think I have vertigo. I called the ENT to get an appointment and they require a referral. Could I have a referral sent to [facility] in [place] on [address]. I have an appointment to see Gilles Lacks Tuesday 6/3 for clogged ear but they need a referral since I also have dizziness.    Thank you.  Rostosky, Skipper Dumas, RMA to [facility]     11/23/23 10:18 AM Tiffany is out of the office today.  They normally want you to set up a ER follow up visit with the provider.  Please call the office and set one up. "

## 2023-11-23 NOTE — Progress Notes (Signed)
 Clinical Summary Deborah Branch is a 89 y.o.female  1.Chronic HFpEF -11/2021 echo: LVEF 65-70%, no WMAs, grade I DD, normal RV, normal PA pressuress, normal LA  - no recent edema - has lasix  prn, has not needed.    2. HTN - recent high bp at home - has been on prednisone  - compliant with meds - from chart review mildly elevated last few appointments, appears prednisone  may have causes further elevation.     3.Asthma - followed by pulmonary   4. Vertigo - er visit yesterday with vertigo symptoms, started on prn meclizine.   5. HLD - on simvastatin  Past Medical History:  Diagnosis Date   Allergic asthma    Asthma    Cataract    Congestive heart failure (CHF) (HCC)    Eczema    GERD (gastroesophageal reflux disease)    Hyperlipidemia    Hypertension    Osteoporosis      Allergies  Allergen Reactions   Clarithromycin     REACTION: itching   Cleocin [Clindamycin] Other (See Comments)    C-diff   Penicillins     REACTION: hives     Current Outpatient Medications  Medication Sig Dispense Refill   albuterol  (VENTOLIN  HFA) 108 (90 Base) MCG/ACT inhaler Inhale 2 puffs into the lungs every 6 (six) hours as needed for wheezing or shortness of breath. 8 g 0   amLODipine  (NORVASC ) 5 MG tablet TAKE 1 TABLET BY MOUTH ONCE DAILY 90 tablet 3   calcium  carbonate (OSCAL) 1500 (600 Ca) MG TABS tablet Take 1 tablet by mouth 2 (two) times daily with a meal.     cetirizine (ZYRTEC) 10 MG tablet Take 10 mg by mouth daily.     denosumab  (PROLIA ) 60 MG/ML SOSY injection Inject 60 mg into the skin every 6 (six) months. 1 mL 1   doxycycline  (VIBRA -TABS) 100 MG tablet Take 1 tablet (100 mg total) by mouth 2 (two) times daily. 14 tablet 0   fluticasone -salmeterol (ADVAIR DISKUS) 100-50 MCG/ACT AEPB Inhale 1 puff into the lungs 2 (two) times daily. 60 each 1   fluticasone -salmeterol (ADVAIR) 100-50 MCG/ACT AEPB INHALE 1 PUFF into THE lungs TWICE DAILY 60 each 12   furosemide  (LASIX )  20 MG tablet TAKE 1 TABLET BY MOUTH DAILY 90 tablet 0   ipratropium-albuterol  (DUONEB) 0.5-2.5 (3) MG/3ML SOLN Take 3 mLs by nebulization every 6 (six) hours as needed (shortness of breath and cough). 75 mL 2   lisinopril  (ZESTRIL ) 40 MG tablet Take 1 tablet (40 mg total) by mouth daily. 90 tablet 3   loratadine (CLARITIN REDITABS) 10 MG dissolvable tablet Take 10 mg by mouth daily.     meclizine (ANTIVERT) 25 MG tablet Take 1 tablet (25 mg total) by mouth 3 (three) times daily as needed for dizziness. 30 tablet 0   Menaquinone-7 (VITAMIN K2) 100 MCG CAPS Take 1 tablet by mouth daily.     montelukast  (SINGULAIR ) 10 MG tablet Take 1 tablet (10 mg total) by mouth at bedtime. 30 tablet 3   Omalizumab  (XOLAIR  [place]) Inject 225 mg into the skin every 3 (three) months.     omeprazole  (PRILOSEC) 20 MG capsule Take 1 capsule (20 mg total) by mouth daily. 90 capsule 3   predniSONE  (DELTASONE ) 10 MG tablet Take 2 tabs daily with food x 5ds, then 1 tab daily with food x 5ds then STOP 20 tablet 0   simvastatin  (ZOCOR ) 20 MG tablet Take 1 tablet (20 mg total) by mouth  at bedtime. 90 tablet 3   traZODone  (DESYREL ) 50 MG tablet TAKE ONE-HALF TO 1 TABLET BY MOUTH AT BEDTIME AS NEEDED FOR SLEEP 90 tablet 3   No current facility-administered medications for this visit.     Past Surgical History:  Procedure Laterality Date   CATARACT EXTRACTION, BILATERAL  2012     Allergies  Allergen Reactions   Clarithromycin     REACTION: itching   Cleocin [Clindamycin] Other (See Comments)    C-diff   Penicillins     REACTION: hives      Family History  Problem Relation Age of Onset   Heart attack Paternal Grandfather    Colon cancer Neg Hx    Esophageal cancer Neg Hx    Rectal cancer Neg Hx    Stomach cancer Neg Hx      Social History Deborah Branch reports that she has quit smoking. Her smoking use included cigarettes. She has never used smokeless tobacco. Deborah Branch reports that she does not currently use  alcohol.    Physical Examination Today's Vitals   11/23/23 1339 11/23/23 1422  BP: (!) 156/62 (!) 158/70  Pulse: 84   SpO2: 95%   Weight: 150 lb 9.6 oz (68.3 kg)   Height: 5' (1.524 m)    Body mass index is 29.41 kg/m.  Gen: resting comfortably, no acute distress HEENT: no scleral icterus, pupils equal round and reactive, no palptable cervical adenopathy,  CV: RRR, 2/6 systolic murmur, no jvd Resp: Clear to auscultation bilaterally GI: abdomen is soft, non-tender, non-distended, normal bowel sounds, no hepatosplenomegaly MSK: extremities are warm, no edema.  Skin: warm, no rash Neuro:  no focal deficits Psych: appropriate affect     Assessment and Plan   1.Chronic HFpEF - fairly isolated occurrence of symptoms during admission with pneumonia, has not been an issue since - has prn lasix  but has not needed - in absence of sustained period of symptoms have not committed to SGLT2i - euvolemic today without symptoms, continue to monitor   2.HTN -high bp's. Had been running high from prior visits, with recent prednisone  course even higher - increase norvasc  to 10mg  daily, udpate us  on bp's in 2 weeks  3. Vertigo - refer to ENT       Laurann Pollock, M.D.

## 2023-11-23 NOTE — Patient Instructions (Addendum)
 Medication Instructions:   Increase Norvasc  to 10mg  daily  Continue all other medications.     Labwork:  none  Testing/Procedures:  none  Follow-Up:  3 months   Any Other Special Instructions Will Be Listed Below (If Applicable).  Please keep BP log x 2 weeks & return to office for provider review.   You have been referred to:  [facility]   If you need a refill on your cardiac medications before your next appointment, please call your pharmacy.

## 2023-11-26 ENCOUNTER — Ambulatory Visit: Admitting: Family Medicine

## 2023-11-27 DIAGNOSIS — R42 Dizziness and giddiness: Secondary | ICD-10-CM | POA: Diagnosis not present

## 2023-11-27 DIAGNOSIS — H6121 Impacted cerumen, right ear: Secondary | ICD-10-CM | POA: Diagnosis not present

## 2023-12-05 ENCOUNTER — Telehealth: Payer: Self-pay

## 2023-12-05 ENCOUNTER — Other Ambulatory Visit: Payer: Self-pay

## 2023-12-05 DIAGNOSIS — M810 Age-related osteoporosis without current pathological fracture: Secondary | ICD-10-CM

## 2023-12-05 MED ORDER — DENOSUMAB 60 MG/ML ~~LOC~~ SOSY
60.0000 mg | PREFILLED_SYRINGE | Freq: Once | SUBCUTANEOUS | Status: AC
  Administered 2024-01-21: 60 mg via SUBCUTANEOUS

## 2023-12-05 NOTE — Progress Notes (Signed)
 Prolia  sent for benefit verification

## 2023-12-05 NOTE — Telephone Encounter (Signed)
 Prolia  VOB initiated via [email or web address]  Next Prolia  inj DUE: 01/21/24

## 2023-12-07 ENCOUNTER — Other Ambulatory Visit (HOSPITAL_COMMUNITY): Payer: Self-pay

## 2023-12-07 NOTE — Telephone Encounter (Signed)
 Pt ready for scheduling for PROLIA  on or after : 01/21/24  Option# 1: Buy/Bill (Office supplied medication)  Out-of-pocket cost due at time of clinic visit: $357  Number of injection/visits approved: 2  Primary: [employer] Prolia  co-insurance: 20% Admin fee co-insurance: 20%  Secondary: --- Prolia  co-insurance:  Admin fee co-insurance:   Medical Benefit Details: Date Benefits were checked: 12/07/23 Deductible: NO/ Coinsurance: 20%/ Admin Fee: 20%  Prior Auth: APPROVED PA# [id number]  Expiration Date: 07/02/23-06/25/24   # of doses approved: 2 ----------------------------------------------------------------------- Option# 2- Med Obtained from pharmacy:  Pharmacy benefit: Copay $64 (Paid to pharmacy) Admin Fee: 20% (Pay at clinic)  Prior Auth: N/A PA# Expiration Date:   # of doses approved:   If patient wants fill through the pharmacy benefit please send prescription to: [employer], and include estimated need by date in rx notes. Pharmacy will ship medication directly to the office.  Patient NOT eligible for Prolia  Copay Card. Copay Card can make patient's cost as little as $25. Link to apply: https://www.amgensupportplus.com/copay  ** This summary of benefits is an estimation of the patient's out-of-pocket cost. Exact cost may very based on individual plan coverage.

## 2023-12-07 NOTE — Telephone Encounter (Signed)
    [employer] can no longer give estimates/quotes on non-chemotherapy drugs. Provider must follow the Fee schedule or use CMS website.     20% coinsurance, 20% admin fee, no deductible, PA required

## 2023-12-13 ENCOUNTER — Encounter: Payer: Self-pay | Admitting: Cardiology

## 2023-12-18 ENCOUNTER — Ambulatory Visit: Payer: Self-pay | Admitting: Cardiology

## 2023-12-18 MED ORDER — DENOSUMAB 60 MG/ML ~~LOC~~ SOSY: 60.0000 mg | PREFILLED_SYRINGE | Freq: Once | SUBCUTANEOUS | 0 refills | Status: AC

## 2023-12-18 NOTE — Telephone Encounter (Signed)
 Called patient to discuss cost and schedule appt - LMTCB

## 2023-12-18 NOTE — Addendum Note (Signed)
 Addended by: BRANDY SETTER D on: 12/18/2023 10:28 AM   Modules accepted: Orders

## 2023-12-19 ENCOUNTER — Ambulatory Visit: Admitting: Student

## 2023-12-24 ENCOUNTER — Ambulatory Visit: Payer: Medicare PPO

## 2023-12-24 ENCOUNTER — Other Ambulatory Visit: Payer: Self-pay

## 2023-12-24 DIAGNOSIS — M810 Age-related osteoporosis without current pathological fracture: Secondary | ICD-10-CM

## 2023-12-24 MED ORDER — DENOSUMAB 60 MG/ML ~~LOC~~ SOSY
60.0000 mg | PREFILLED_SYRINGE | Freq: Once | SUBCUTANEOUS | 0 refills | Status: AC
Start: 2023-12-24 — End: 2023-12-24

## 2023-12-26 ENCOUNTER — Telehealth: Payer: Self-pay

## 2023-12-26 NOTE — Telephone Encounter (Signed)
 Spoke with patient regarding prior message. Advised patient that Dr.Young stated she has zyrtec or claritin on patient's med list . Patient stated she has not had those medication's in some time but will pick up some . Patient stated she has been sneezing some no fever . Advised patient is she doesn't feel better in a few days after taking zyrtec or claritin to call our office back .   Patient's voice was understanding . Nothing else further needed.

## 2023-12-26 NOTE — Telephone Encounter (Signed)
 Deborah Branch already has 2 antihistamines (cetirizine/Zyrtec and loratadine/Claritin on her medicine list. If she needs more help with stuffy/ runny nose then she can add otc fluticasone / Flonase  nasal spray- 2 puffs each nostril once daily. Otherwise I need to know more detail about what kind of allergy  symptoms she is having.

## 2023-12-26 NOTE — Telephone Encounter (Signed)
 Copied from CRM [id number]. Topic: General - Other >> Dec 26, 2023  9:41 AM Rilla B wrote: Reason for CRM: Patient states she spoke with the office on Monday 6/30 and wanted Dr Neysa to get her something for allergies.  Patient has not heard back from office. Please call.  Spoke with patient regarding prior message. Advised patient that I have not seen a encounter that has been sent to [facility] on 6/30/ Advised patient I will send this message to Dr.Young to advise.

## 2023-12-27 ENCOUNTER — Other Ambulatory Visit: Payer: Self-pay | Admitting: Family Medicine

## 2023-12-27 ENCOUNTER — Other Ambulatory Visit: Payer: Self-pay

## 2024-01-04 ENCOUNTER — Ambulatory Visit: Admitting: Cardiology

## 2024-01-07 DIAGNOSIS — H903 Sensorineural hearing loss, bilateral: Secondary | ICD-10-CM | POA: Diagnosis not present

## 2024-01-16 NOTE — Telephone Encounter (Signed)
 Spoke with Tim from [employer]. He called to confirm Prolia  shipment. He said it should be delivered to our office by Friday 01/18/2024.

## 2024-01-17 ENCOUNTER — Telehealth: Payer: Self-pay

## 2024-01-17 NOTE — Telephone Encounter (Signed)
 Prolia  is scheduled to be delivered tomorrow 01/17/2024.

## 2024-01-21 ENCOUNTER — Ambulatory Visit (INDEPENDENT_AMBULATORY_CARE_PROVIDER_SITE_OTHER)

## 2024-01-21 DIAGNOSIS — M810 Age-related osteoporosis without current pathological fracture: Secondary | ICD-10-CM | POA: Diagnosis not present

## 2024-01-21 NOTE — Progress Notes (Signed)
 Patient is in office today for a nurse visit for  Prolia injection . Patient Injection was given in the  Left arm. Patient tolerated injection well.

## 2024-02-21 ENCOUNTER — Encounter: Attending: Internal Medicine | Admitting: *Deleted

## 2024-02-21 VITALS — BP 136/65 | HR 73 | Temp 98.2°F

## 2024-02-21 DIAGNOSIS — J4540 Moderate persistent asthma, uncomplicated: Secondary | ICD-10-CM | POA: Diagnosis not present

## 2024-02-21 MED ORDER — OMALIZUMAB 75 MG/0.5ML ~~LOC~~ SOSY
225.0000 mg | PREFILLED_SYRINGE | Freq: Once | SUBCUTANEOUS | Status: AC
  Administered 2024-02-21: 225 mg via SUBCUTANEOUS

## 2024-02-21 NOTE — Progress Notes (Signed)
 Diagnosis: Asthma  Provider:  Reggy Salt, MD  Procedure: Injection  Xolair  (Omalizumab ), Dose: 225 mg, Site: subcutaneous, Number of injections: 2  Injection Site(s): Left arm and Right arm  Post Care: Observation period completed  Discharge: Condition: Good, Destination: Home . AVS Provided  Performed by:  Baldwin Darice Helling, RN

## 2024-02-26 ENCOUNTER — Encounter: Payer: Self-pay | Admitting: Nurse Practitioner

## 2024-02-26 ENCOUNTER — Ambulatory Visit: Admitting: Nurse Practitioner

## 2024-02-26 ENCOUNTER — Ambulatory Visit: Attending: Nurse Practitioner | Admitting: Nurse Practitioner

## 2024-02-26 VITALS — BP 126/74 | HR 76 | Ht 60.0 in | Wt 150.0 lb

## 2024-02-26 DIAGNOSIS — I10 Essential (primary) hypertension: Secondary | ICD-10-CM

## 2024-02-26 DIAGNOSIS — R011 Cardiac murmur, unspecified: Secondary | ICD-10-CM

## 2024-02-26 DIAGNOSIS — E785 Hyperlipidemia, unspecified: Secondary | ICD-10-CM | POA: Diagnosis not present

## 2024-02-26 DIAGNOSIS — I5032 Chronic diastolic (congestive) heart failure: Secondary | ICD-10-CM

## 2024-02-26 MED ORDER — FUROSEMIDE 20 MG PO TABS: 20.0000 mg | ORAL_TABLET | Freq: Every day | ORAL | 1 refills | Status: AC | PRN

## 2024-02-26 NOTE — Patient Instructions (Addendum)
 Medication Instructions:  Your physician has recommended you make the following change in your medication:  Please change lasix  to 20 Mg daily as needed for leg swelling   Labwork: None   Testing/Procedures: Your physician has requested that you have an echocardiogram. Echocardiography is a painless test that uses sound waves to create images of your heart. It provides your doctor with information about the size and shape of your heart and how well your heart's chambers and valves are working. This procedure takes approximately one hour. There are no restrictions for this procedure. Please do NOT wear cologne, perfume, aftershave, or lotions (deodorant is allowed). Please arrive 15 minutes prior to your appointment time.  Please note: We ask at that you not bring children with you during ultrasound (echo/ vascular) testing. Due to room size and safety concerns, children are not allowed in the ultrasound rooms during exams. Our front office staff cannot provide observation of children in our lobby area while testing is being conducted. An adult accompanying a patient to their appointment will only be allowed in the ultrasound room at the discretion of the ultrasound technician under special circumstances. We apologize for any inconvenience.  Follow-Up: Your physician recommends that you schedule a follow-up appointment in: 1 Year   Any Other Special Instructions Will Be Listed Below (If Applicable).  If you need a refill on your cardiac medications before your next appointment, please call your pharmacy.

## 2024-02-26 NOTE — Progress Notes (Unsigned)
 Cardiology Office Note   Date:  02/26/2024 ID:  Deborah Branch, DOB 01-14-1935, MRN [id number] PCP: Joesph Annabella HERO, FNP   HeartCare Providers Cardiologist:  Alvan Carrier, MD     History of Present Illness Deborah Branch is a very pleasant 89 y.o. female with a PMH of chronic HFpEF, HTN, HLD, asthma, GERD, and vertigo, who presents today for a 6 month follow-up.   Last seen by Dr. Alvan on 11/23/2023. Was overall doing well at the time. Did have some high BP's. Norvasc  was increased to 10 mg daily and was referred to ENT d/t her vertigo.   Today she presents for 3 month follow-up. She states she is doing well.  Tells me she is not taking Lasix  daily but only taking this 20 mg daily as needed.  Denies any acute cardiac complaints or issues. Denies any chest pain, shortness of breath, palpitations, syncope, presyncope, dizziness, orthopnea, PND, swelling or significant weight changes, acute bleeding, or claudication.  ROS: Negative. See HPI.  SH: Granddaughter works for [employer].   Studies Reviewed  EKG: EKG is not ordered today.   Echo 11/2021:  1. Left ventricular ejection fraction, by estimation, is 65 to 70%. Left  ventricular ejection fraction by 2D MOD biplane is 70.0 %. The left  ventricle has normal function. The left ventricle has no regional wall  motion abnormalities. Left ventricular  diastolic parameters are consistent with Grade I diastolic dysfunction  (impaired relaxation).   2. Right ventricular systolic function is normal. The right ventricular  size is normal. There is normal pulmonary artery systolic pressure. The  estimated right ventricular systolic pressure is 31.5 mmHg.   3. The mitral valve is abnormal. Trivial mitral valve regurgitation.   4. The aortic valve is tricuspid. Aortic valve regurgitation is not  visualized. Aortic valve sclerosis/calcification is present, without any  evidence of aortic stenosis. Aortic valve mean gradient  measures 8.0 mmHg.   5. The inferior vena cava is normal in size with greater than 50%  respiratory variability, suggesting right atrial pressure of 3 mmHg.   Comparison(s): No prior Echocardiogram.    Physical Exam VS:  BP 126/74   Pulse 76   Ht 5' (1.524 m)   Wt 150 lb (68 kg)   SpO2 98%   BMI 29.29 kg/m        Wt Readings from Last 3 Encounters:  02/26/24 150 lb (68 kg)  11/23/23 150 lb 9.6 oz (68.3 kg)  11/12/23 149 lb 3.2 oz (67.7 kg)    GEN: Well nourished, well developed in no acute distress NECK: No JVD; No carotid bruits CARDIAC: S1/S2, RRR, Grade 1/2 murmur, no rubs, no gallops RESPIRATORY:  Clear to auscultation without rales, wheezing or rhonchi  ABDOMEN: Soft, non-tender, non-distended EXTREMITIES:  No edema; No deformity   ASSESSMENT AND PLAN  Chronic HFpEF Stage C, NYHA class I symptoms.  EF 65-70% in 2023. Euvolemic and well compensated on exam.  She is now only taking Lasix  20 mg daily as needed and we will switch this so this is reflected on her med list. Continue rest of medication regimen. Low sodium diet, fluid restriction <2L, and daily weights encouraged. Educated to contact our office for weight gain of 2 lbs overnight or 5 lbs in one week.  Will update echocardiogram at this time-see below.  HTN Blood pressure stable and at goal. Discussed to monitor BP at home at least 2 hours after medications and sitting for 5-10 minutes.  Continue  current medication regimen. Heart healthy diet and regular cardiovascular exercise encouraged.   HLD LDL 101 from September 2024.  This is being managed by her PCP.  Continue simvastatin . Heart healthy diet and regular cardiovascular exercise encouraged.   Heart murmur Grade 1/2 murmur noted on exam.  Denies any symptoms.  Will update echocardiogram at this time.    Dispo: Follow-up with MD/APP in 1 year or sooner if anything changes.  Signed, Almarie Crate, NP

## 2024-03-24 ENCOUNTER — Ambulatory Visit: Attending: Nurse Practitioner

## 2024-03-24 DIAGNOSIS — I5032 Chronic diastolic (congestive) heart failure: Secondary | ICD-10-CM | POA: Diagnosis not present

## 2024-03-24 DIAGNOSIS — R011 Cardiac murmur, unspecified: Secondary | ICD-10-CM

## 2024-03-25 LAB — ECHOCARDIOGRAM COMPLETE
AR max vel: 1.42 cm2
AV Area VTI: 1.41 cm2
AV Area mean vel: 1.38 cm2
AV Mean grad: 16 mmHg
AV Peak grad: 32 mmHg
Ao pk vel: 2.83 m/s
Area-P 1/2: 3.06 cm2
Calc EF: 65.4 %
MV VTI: 1.59 cm2
S' Lateral: 2.4 cm
Single Plane A2C EF: 69.5 %
Single Plane A4C EF: 60.1 %

## 2024-03-26 ENCOUNTER — Other Ambulatory Visit: Payer: Self-pay | Admitting: Family Medicine

## 2024-03-26 DIAGNOSIS — J302 Other seasonal allergic rhinitis: Secondary | ICD-10-CM

## 2024-03-26 DIAGNOSIS — J4540 Moderate persistent asthma, uncomplicated: Secondary | ICD-10-CM

## 2024-03-28 ENCOUNTER — Other Ambulatory Visit: Payer: Self-pay | Admitting: Family Medicine

## 2024-03-28 DIAGNOSIS — I10 Essential (primary) hypertension: Secondary | ICD-10-CM

## 2024-03-28 DIAGNOSIS — K219 Gastro-esophageal reflux disease without esophagitis: Secondary | ICD-10-CM

## 2024-03-28 DIAGNOSIS — E782 Mixed hyperlipidemia: Secondary | ICD-10-CM

## 2024-03-31 DIAGNOSIS — H903 Sensorineural hearing loss, bilateral: Secondary | ICD-10-CM | POA: Diagnosis not present

## 2024-04-01 ENCOUNTER — Telehealth: Payer: Self-pay | Admitting: Family Medicine

## 2024-04-01 NOTE — Telephone Encounter (Signed)
 Copied from CRM #[id number]. Topic: Clinical - Medication Question >> Apr 01, 2024 11:24 AM [name] ORN wrote: Reason for CRM: Patient called to request to speak to Nurse Tobias about her Prolia  Shot when she is in the office. Please call back the patient to discuss when available.

## 2024-04-01 NOTE — Telephone Encounter (Signed)
 Called and spoke with patient and made her aware that when its time for her to have another Prolia  shot, Melissa will reach out to her to get her scheduled. Should be in January. Patient voiced understanding.

## 2024-04-25 ENCOUNTER — Telehealth: Payer: Self-pay

## 2024-04-25 NOTE — Telephone Encounter (Signed)
 Auth Submission: APPROVED Site of care: Site of care: [facility] Payer: [employer] medicare Medication & CPT/J Code(s) submitted: Xolair  (Omalizumab ) [id number] Diagnosis Code:  Route of submission (phone, fax, portal): portal Phone # Fax # Auth type: Buy/Bill PB Units/visits requested: 225mg  q12weeks Reference number: [id number] Approval from: 06/26/24 to 06/25/25

## 2024-05-14 ENCOUNTER — Encounter: Payer: Self-pay | Admitting: Family Medicine

## 2024-05-14 ENCOUNTER — Ambulatory Visit: Payer: Self-pay | Admitting: Family Medicine

## 2024-05-14 VITALS — BP 129/58 | HR 74 | Temp 98.3°F | Ht 60.0 in | Wt 147.4 lb

## 2024-05-14 DIAGNOSIS — K219 Gastro-esophageal reflux disease without esophagitis: Secondary | ICD-10-CM

## 2024-05-14 DIAGNOSIS — J4540 Moderate persistent asthma, uncomplicated: Secondary | ICD-10-CM

## 2024-05-14 DIAGNOSIS — Z23 Encounter for immunization: Secondary | ICD-10-CM | POA: Diagnosis not present

## 2024-05-14 DIAGNOSIS — Z0000 Encounter for general adult medical examination without abnormal findings: Secondary | ICD-10-CM

## 2024-05-14 DIAGNOSIS — E782 Mixed hyperlipidemia: Secondary | ICD-10-CM | POA: Diagnosis not present

## 2024-05-14 DIAGNOSIS — I5032 Chronic diastolic (congestive) heart failure: Secondary | ICD-10-CM | POA: Diagnosis not present

## 2024-05-14 DIAGNOSIS — R7303 Prediabetes: Secondary | ICD-10-CM

## 2024-05-14 DIAGNOSIS — Z0001 Encounter for general adult medical examination with abnormal findings: Secondary | ICD-10-CM | POA: Diagnosis not present

## 2024-05-14 DIAGNOSIS — I10 Essential (primary) hypertension: Secondary | ICD-10-CM | POA: Diagnosis not present

## 2024-05-14 LAB — BAYER DCA HB A1C WAIVED: HB A1C (BAYER DCA - WAIVED): 5.7 % — ABNORMAL HIGH (ref 4.8–5.6)

## 2024-05-14 NOTE — Progress Notes (Signed)
 Complete physical exam  Patient: Deborah Branch   DOB: 11/01/1934   89 y.o. Female  MRN: [id number]  Subjective:    Chief Complaint  Patient presents with   Annual Exam    Deborah Branch is a 89 y.o. female who presents today for a complete physical exam. She reports consuming a general diet. The patient does not participate in regular exercise at present. She generally feels well. She reports sleeping fairly well. She does have additional problems to discuss today.   Sleep disturbance - Difficulty maintaining sleep, with after awakening to urinate - Prolonged sleep latency after awakening, sometimes taking 30 minutes or longer to fall back asleep - Not currently taking trazodone , though medication is available at home  Appetite and nutrition - Diet described as healthy - Occasional lack of appetite  Physical activity - Low level of physical activity, self-described as 'lazy' regarding exercise  Asthma - Asthma is well-controlled - No recent exacerbations - No chest pain or shortness of breath  Gastroesophageal reflux symptoms - History of acid reflux - No current symptoms or problems related to acid reflux  Hearing loss - Recently diagnosed with 70% hearing loss - Recently started using hearing aids - Previously unaware of hearing deficit, prompted by son to seek evaluation  Gastrointestinal and cardiac symptoms - No nausea or vomiting - No chest pain       Most recent fall risk assessment:    05/14/2024    1:06 PM  Fall Risk   Falls in the past year? 0     Most recent depression screenings:    05/14/2024    1:07 PM 02/21/2024   12:51 PM  PHQ 2/9 Scores  PHQ - 2 Score 0 0  PHQ- 9 Score 3         Patient Care Team: Joesph Annabella HERO, FNP as PCP - General (Family Medicine) Alvan Dorn FALCON, MD as PCP - Cardiology (Cardiology)   Outpatient Medications Prior to Visit  Medication Sig   albuterol  (VENTOLIN  HFA) 108 (90 Base) MCG/ACT inhaler Inhale 2  puffs into the lungs every 6 (six) hours as needed for wheezing or shortness of breath.   amLODipine  (NORVASC ) 10 MG tablet Take 1 tablet (10 mg total) by mouth daily.   calcium  carbonate (OSCAL) 1500 (600 Ca) MG TABS tablet Take 1 tablet by mouth 2 (two) times daily with a meal.   cetirizine (ZYRTEC) 10 MG tablet Take 10 mg by mouth daily.   fluticasone -salmeterol (ADVAIR DISKUS) 100-50 MCG/ACT AEPB Inhale 1 puff into the lungs 2 (two) times daily.   fluticasone -salmeterol (ADVAIR) 100-50 MCG/ACT AEPB INHALE 1 PUFF into THE lungs TWICE DAILY   furosemide  (LASIX ) 20 MG tablet Take 1 tablet (20 mg total) by mouth daily as needed (Leg swelling).   ipratropium-albuterol  (DUONEB) 0.5-2.5 (3) MG/3ML SOLN Take 3 mLs by nebulization every 6 (six) hours as needed (shortness of breath and cough).   lisinopril  (ZESTRIL ) 40 MG tablet TAKE 1 TABLET BY MOUTH DAILY   loratadine (CLARITIN REDITABS) 10 MG dissolvable tablet Take 10 mg by mouth daily.   meclizine  (ANTIVERT ) 25 MG tablet Take 1 tablet (25 mg total) by mouth 3 (three) times daily as needed for dizziness.   Menaquinone-7 (VITAMIN K2) 100 MCG CAPS Take 1 tablet by mouth daily.   montelukast  (SINGULAIR ) 10 MG tablet TAKE 1 TABLET BY MOUTH DAILY AT BEDTIME   Omalizumab  (XOLAIR  ) Inject 225 mg into the skin every 3 (three) months.   omeprazole  (  PRILOSEC) 20 MG capsule TAKE ONE CAPSULE BY MOUTH DAILY   simvastatin  (ZOCOR ) 20 MG tablet TAKE 1 TABLET BY MOUTH AT BEDTIME   predniSONE  (DELTASONE ) 10 MG tablet Take 2 tabs daily with food x 5ds, then 1 tab daily with food x 5ds then STOP (Patient not taking: Reported on 05/14/2024)   traZODone  (DESYREL ) 50 MG tablet TAKE ONE-HALF TO 1 TABLET BY MOUTH AT BEDTIME AS NEEDED FOR SLEEP (Patient not taking: Reported on 05/14/2024)   [DISCONTINUED] doxycycline  (VIBRA -TABS) 100 MG tablet Take 1 tablet (100 mg total) by mouth 2 (two) times daily.   No facility-administered medications prior to visit.    ROS As  per HPI.        Objective:     BP (!) 129/58   Pulse 74   Temp 98.3 F (36.8 C) (Temporal)   Ht 5' (1.524 m)   Wt 147 lb 6.4 oz (66.9 kg)   SpO2 98%   BMI 28.79 kg/m  Wt Readings from Last 3 Encounters:  05/14/24 147 lb 6.4 oz (66.9 kg)  02/26/24 150 lb (68 kg)  11/23/23 150 lb 9.6 oz (68.3 kg)      Physical Exam Vitals and nursing note reviewed.  Constitutional:      General: She is not in acute distress.    Appearance: Normal appearance. She is not ill-appearing, toxic-appearing or diaphoretic.  HENT:     Head: Normocephalic.     Right Ear: Tympanic membrane, ear canal and external ear normal.     Left Ear: Tympanic membrane, ear canal and external ear normal.     Nose: Nose normal.     Mouth/Throat:     Mouth: Mucous membranes are moist.     Pharynx: Oropharynx is clear.  Eyes:     Extraocular Movements: Extraocular movements intact.     Conjunctiva/sclera: Conjunctivae normal.     Pupils: Pupils are equal, round, and reactive to light.  Cardiovascular:     Rate and Rhythm: Normal rate and regular rhythm.     Pulses: Normal pulses.     Heart sounds: Normal heart sounds. No murmur heard.    No friction rub. No gallop.  Pulmonary:     Effort: Pulmonary effort is normal.     Breath sounds: Normal breath sounds.  Abdominal:     General: Bowel sounds are normal. There is no distension.     Palpations: Abdomen is soft. There is no mass.     Tenderness: There is no abdominal tenderness. There is no guarding.  Musculoskeletal:        General: No tenderness.     Cervical back: Normal range of motion and neck supple. No tenderness.     Right lower leg: No edema.     Left lower leg: No edema.  Skin:    General: Skin is warm and dry.     Capillary Refill: Capillary refill takes less than 2 seconds.     Findings: No lesion or rash.  Neurological:     General: No focal deficit present.     Mental Status: She is alert and oriented to person, place, and time.      Cranial Nerves: No cranial nerve deficit.     Motor: No weakness.     Gait: Gait normal.  Psychiatric:        Mood and Affect: Mood normal.        Behavior: Behavior normal.        Thought Content: Thought content normal.  Judgment: Judgment normal.      No results found for any visits on 05/14/24.     Assessment & Plan:    Routine Health Maintenance and Physical Exam  Despina was seen today for annual exam.  Diagnoses and all orders for this visit:  Encounter for wellness examination in adult  Encounter for immunization -     Flu vaccine HIGH DOSE PF(Fluzone Trivalent)  Chronic diastolic heart failure (HCC)  Mixed hyperlipidemia  Moderate persistent asthma without complication  Primary hypertension -     CBC with Differential/Platelet -     CMP14+EGFR -     TSH  Prediabetes -     Bayer DCA Hb A1c Waived  Gastroesophageal reflux disease without esophagitis   Assessment and Plan    Adult Wellness Visit - Performed lab work today, excluding cholesterol. - Schedule fasting cholesterol panel in six months.  Insomnia Difficulty returning to sleep after nocturnal awakenings.  - Try trazodone  again for a few nights. - Consider doxepin if trazodone  is ineffective and insurance covers it.  Mixed hyperlipidemia On cholesterol medication. Last check over a year ago. Discussed importance of fasting for accurate reading. - Schedule fasting cholesterol panel in six months.     CHF Euvolemic on exam  Prediabetes A1c pending.  GERD  Well controlled.   Asthma Controlled. Continue follow up with pulmonology.   Immunization History  Administered Date(s) Administered    sv, Bivalent, Protein Subunit Rsvpref,pf (Abrysvo) 04/11/2022   Fluad Quad(high Dose 65+) 03/19/2019, 03/18/2020, 03/10/2021, 04/03/2022   Fluad Trivalent(High Dose 65+) 03/21/2023   INFLUENZA, HIGH DOSE SEASONAL PF 03/19/2017, 03/21/2018, 05/14/2024   Influenza Split 03/13/2011, 03/12/2012    Influenza Whole 03/13/2008, 03/14/2010   Influenza,inj,Quad PF,6+ Mos 03/12/2013, 03/12/2014, 03/15/2015, 03/17/2016   PFIZER(Purple Top)SARS-COV-2 Vaccination 08/08/2019, 09/02/2019   PNEUMOCOCCAL CONJUGATE-20 03/21/2023   Pneumococcal Polysaccharide-23 01/28/2020   Pneumococcal-Unspecified 12/24/2013   Tdap 10/10/2021   Zoster Recombinant(Shingrix ) 05/09/2023, 11/12/2023    Health Maintenance  Topic Date Due   COVID-19 Vaccine (3 - 2025-26 season) 05/30/2025 (Originally 02/25/2024)   Medicare Annual Wellness (AWV)  08/20/2024   Bone Density Scan  09/20/2024   DTaP/Tdap/Td (2 - Td or Tdap) 10/11/2031   Pneumococcal Vaccine: 50+ Years  Completed   Influenza Vaccine  Completed   Zoster Vaccines- Shingrix   Completed   Meningococcal B Vaccine  Aged Out    Discussed health benefits of physical activity, and encouraged her to engage in regular exercise appropriate for her age and condition.  Problem List Items Addressed This Visit       Cardiovascular and Mediastinum   Primary hypertension   Relevant Orders   CBC with Differential/Platelet   CMP14+EGFR   TSH   Chronic diastolic heart failure (HCC)     Respiratory   Moderate persistent asthma without complication     Digestive   GERD (gastroesophageal reflux disease)     Other   Mixed hyperlipidemia   Prediabetes   Relevant Orders   Bayer DCA Hb A1c Waived (Completed)   Other Visit Diagnoses       Encounter for wellness examination in adult    -  Primary     Encounter for immunization       Relevant Orders   Flu vaccine HIGH DOSE PF(Fluzone Trivalent) (Completed)      Return in about 6 months (around 11/11/2024) for chronic follow up.   The patient indicates understanding of these issues and agrees with the plan.  Annabella CHRISTELLA Search, FNP

## 2024-05-15 ENCOUNTER — Ambulatory Visit: Payer: Self-pay | Admitting: Family Medicine

## 2024-05-15 ENCOUNTER — Encounter: Attending: Internal Medicine | Admitting: Emergency Medicine

## 2024-05-15 VITALS — BP 160/51 | HR 61 | Temp 98.1°F | Resp 16

## 2024-05-15 DIAGNOSIS — J4540 Moderate persistent asthma, uncomplicated: Secondary | ICD-10-CM

## 2024-05-15 LAB — CMP14+EGFR
ALT: 13 IU/L (ref 0–32)
AST: 22 IU/L (ref 0–40)
Albumin: 4.2 g/dL (ref 3.7–4.7)
Alkaline Phosphatase: 60 IU/L (ref 48–129)
BUN/Creatinine Ratio: 14 (ref 12–28)
BUN: 12 mg/dL (ref 8–27)
Bilirubin Total: 0.4 mg/dL (ref 0.0–1.2)
CO2: 22 mmol/L (ref 20–29)
Calcium: 9.6 mg/dL (ref 8.7–10.3)
Chloride: 103 mmol/L (ref 96–106)
Creatinine, Ser: 0.88 mg/dL (ref 0.57–1.00)
Globulin, Total: 2.3 g/dL (ref 1.5–4.5)
Glucose: 82 mg/dL (ref 70–99)
Potassium: 4 mmol/L (ref 3.5–5.2)
Sodium: 141 mmol/L (ref 134–144)
Total Protein: 6.5 g/dL (ref 6.0–8.5)
eGFR: 64 mL/min/1.73 (ref >59)

## 2024-05-15 LAB — CBC WITH DIFFERENTIAL/PLATELET
Basophils Absolute: 0.1 x10E3/uL (ref 0.0–0.2)
Basos: 1 %
EOS (ABSOLUTE): 0.2 x10E3/uL (ref 0.0–0.4)
Eos: 2 %
Hematocrit: 40.1 % (ref 34.0–46.6)
Hemoglobin: 12.7 g/dL (ref 11.1–15.9)
Immature Grans (Abs): 0 x10E3/uL (ref 0.0–0.1)
Immature Granulocytes: 0 %
Lymphocytes Absolute: 3.3 x10E3/uL — ABNORMAL HIGH (ref 0.7–3.1)
Lymphs: 32 %
MCH: 27.5 pg (ref 26.6–33.0)
MCHC: 31.7 g/dL (ref 31.5–35.7)
MCV: 87 fL (ref 79–97)
Monocytes Absolute: 1 x10E3/uL — ABNORMAL HIGH (ref 0.1–0.9)
Monocytes: 10 %
Neutrophils Absolute: 5.7 x10E3/uL (ref 1.4–7.0)
Neutrophils: 55 %
Platelets: 297 x10E3/uL (ref 150–450)
RBC: 4.61 x10E6/uL (ref 3.77–5.28)
RDW: 13.2 % (ref 11.7–15.4)
WBC: 10.4 x10E3/uL (ref 3.4–10.8)

## 2024-05-15 LAB — TSH: TSH: 4.32 u[IU]/mL (ref 0.450–4.500)

## 2024-05-15 MED ORDER — OMALIZUMAB 75 MG/0.5ML ~~LOC~~ SOSY
225.0000 mg | PREFILLED_SYRINGE | Freq: Once | SUBCUTANEOUS | Status: AC
  Administered 2024-05-15: 225 mg via SUBCUTANEOUS

## 2024-05-15 NOTE — Progress Notes (Signed)
 Diagnosis: Asthma  Provider:  Reggy Salt  Procedure: Injection  Xolair  (Omalizumab ), Dose: 225 mg, Site: subcutaneous, Number of injections: 2  Injection Site(s): Left arm 75mg  and Right arm 150 mg  Post Care: Patient declined observation  Discharge: Condition: Good, Destination: Home . AVS Declined  Performed by:  Delon ONEIDA Officer, RN

## 2024-06-02 ENCOUNTER — Ambulatory Visit: Payer: Self-pay | Admitting: Nurse Practitioner

## 2024-06-24 ENCOUNTER — Telehealth: Payer: Self-pay

## 2024-06-24 DIAGNOSIS — M810 Age-related osteoporosis without current pathological fracture: Secondary | ICD-10-CM

## 2024-06-24 MED ORDER — DENOSUMAB 60 MG/ML ~~LOC~~ SOSY
60.0000 mg | PREFILLED_SYRINGE | Freq: Once | SUBCUTANEOUS | Status: AC
  Administered 2024-07-23: 60 mg via SUBCUTANEOUS

## 2024-06-24 NOTE — Telephone Encounter (Signed)
 Prolia  sent for benefit verification

## 2024-06-25 ENCOUNTER — Telehealth: Payer: Self-pay

## 2024-06-25 NOTE — Telephone Encounter (Addendum)
 Prolia  VOB initiated via [email or web address]  Next Prolia  inj DUE: 07/24/23   *Per [employer] website, Scheduled blackout dates: 06/26/2024 - 07/04/2024.*

## 2024-06-26 ENCOUNTER — Other Ambulatory Visit: Payer: Self-pay | Admitting: Cardiology

## 2024-06-26 ENCOUNTER — Other Ambulatory Visit: Payer: Self-pay | Admitting: Family Medicine

## 2024-06-26 DIAGNOSIS — J4540 Moderate persistent asthma, uncomplicated: Secondary | ICD-10-CM

## 2024-06-26 DIAGNOSIS — J302 Other seasonal allergic rhinitis: Secondary | ICD-10-CM

## 2024-07-03 ENCOUNTER — Encounter: Payer: Self-pay | Admitting: Internal Medicine

## 2024-07-07 ENCOUNTER — Other Ambulatory Visit (HOSPITAL_COMMUNITY): Payer: Self-pay

## 2024-07-07 NOTE — Telephone Encounter (Signed)
 SABRA

## 2024-07-07 NOTE — Telephone Encounter (Signed)
 Pt ready for scheduling for PROLIA  on or after : 07/23/24  Option# 1: Buy/Bill (Office supplied medication)  Out-of-pocket cost due at time of clinic visit: $0  Number of injection/visits approved: 2  Primary: [employer] Prolia  co-insurance: 0% Admin fee co-insurance: 0%  Secondary: --- Prolia  co-insurance:  Admin fee co-insurance:   Medical Benefit Details: Date Benefits were checked: 07/03/24 Deductible: NO/ Coinsurance: 0%/ Admin Fee: 0%  Prior Auth: APPROVED PA# [id number] Expiration Date: 06/26/24-06/25/25  # of doses approved: 2 ----------------------------------------------------------------------- Option# 2- Med Obtained from pharmacy:  Pharmacy benefit: Copay $64 (Paid to pharmacy) Admin Fee: 0% (Pay at clinic)  Prior Auth: N/A PA# Expiration Date:   # of doses approved:   If patient wants fill through the pharmacy benefit please send prescription to: [facility], and include estimated need by date in rx notes. Pharmacy will ship medication directly to the office.  Patient NOT eligible for Prolia  Copay Card. Copay Card can make patient's cost as little as $25. Link to apply: [email or web address]://[email or web address]  ** This summary of benefits is an estimation of the patient's out-of-pocket cost. Exact cost may very based on individual plan coverage.

## 2024-07-17 ENCOUNTER — Telehealth: Payer: Self-pay

## 2024-07-17 NOTE — Telephone Encounter (Signed)
Appointment has been scheduled - patient is aware.

## 2024-07-23 ENCOUNTER — Ambulatory Visit: Admitting: *Deleted

## 2024-07-23 ENCOUNTER — Ambulatory Visit

## 2024-07-23 DIAGNOSIS — M810 Age-related osteoporosis without current pathological fracture: Secondary | ICD-10-CM

## 2024-07-23 NOTE — Progress Notes (Signed)
 Pt given Prolia  injection SubQ right upper arm and tolerated well.

## 2024-07-28 ENCOUNTER — Ambulatory Visit

## 2024-08-07 ENCOUNTER — Ambulatory Visit

## 2024-08-21 ENCOUNTER — Ambulatory Visit: Payer: Self-pay

## 2024-09-09 ENCOUNTER — Ambulatory Visit: Admitting: Internal Medicine

## 2024-09-11 ENCOUNTER — Encounter: Admitting: Pulmonary Disease

## 2024-09-18 ENCOUNTER — Ambulatory Visit

## 2024-11-13 ENCOUNTER — Ambulatory Visit: Admitting: Family Medicine

## 2025-05-15 ENCOUNTER — Encounter: Admitting: Family Medicine
